# Patient Record
Sex: Female | Born: 2000 | Hispanic: No | Marital: Single | Smoking: Never smoker
Health system: Southern US, Community
[De-identification: ages and names within clinical notes are randomized; demographics above are authoritative.]

## PROBLEM LIST (undated history)

## (undated) DIAGNOSIS — E1165 Type 2 diabetes mellitus with hyperglycemia: Secondary | ICD-10-CM

## (undated) DIAGNOSIS — M419 Scoliosis, unspecified: Secondary | ICD-10-CM

## (undated) DIAGNOSIS — Q899 Congenital malformation, unspecified: Secondary | ICD-10-CM

## (undated) DIAGNOSIS — Q8711 Prader-Willi syndrome: Secondary | ICD-10-CM

## (undated) DIAGNOSIS — IMO0002 Reserved for concepts with insufficient information to code with codable children: Secondary | ICD-10-CM

## (undated) DIAGNOSIS — E1169 Type 2 diabetes mellitus with other specified complication: Secondary | ICD-10-CM

## (undated) DIAGNOSIS — M92512 Juvenile osteochondrosis of proximal tibia, left leg: Secondary | ICD-10-CM

## (undated) DIAGNOSIS — E08628 Diabetes mellitus due to underlying condition with other skin complications: Secondary | ICD-10-CM

## (undated) HISTORY — DX: Type 2 diabetes mellitus with hyperglycemia: E11.65

## (undated) HISTORY — DX: Scoliosis, unspecified: M41.9

## (undated) HISTORY — DX: Juvenile osteochondrosis of proximal tibia, left leg: M92.512

## (undated) HISTORY — DX: Type 2 diabetes mellitus with other specified complication: E11.69

## (undated) HISTORY — DX: Reserved for concepts with insufficient information to code with codable children: IMO0002

## (undated) HISTORY — DX: Prader-Willi syndrome: Q87.11

## (undated) HISTORY — PX: KNEE SURGERY: SHX244

## (undated) HISTORY — DX: Diabetes mellitus due to underlying condition with other skin complications: E08.628

## (undated) HISTORY — PX: MYRINGOTOMY: SHX2060

---
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
## 2020-07-20 ENCOUNTER — Ambulatory Visit (INDEPENDENT_AMBULATORY_CARE_PROVIDER_SITE_OTHER): Payer: Medicare Other | Admitting: Nurse Practitioner

## 2020-07-20 ENCOUNTER — Other Ambulatory Visit: Payer: Self-pay

## 2020-07-20 ENCOUNTER — Other Ambulatory Visit (HOSPITAL_BASED_OUTPATIENT_CLINIC_OR_DEPARTMENT_OTHER)
Admission: RE | Admit: 2020-07-20 | Discharge: 2020-07-20 | Disposition: A | Discharge status: Home / Self Care | Payer: Medicare Other | Source: Ambulatory Visit | Attending: Nurse Practitioner | Admitting: Nurse Practitioner

## 2020-07-20 ENCOUNTER — Other Ambulatory Visit (HOSPITAL_BASED_OUTPATIENT_CLINIC_OR_DEPARTMENT_OTHER): Payer: Self-pay

## 2020-07-20 ENCOUNTER — Encounter (HOSPITAL_BASED_OUTPATIENT_CLINIC_OR_DEPARTMENT_OTHER): Payer: Self-pay | Admitting: Nurse Practitioner

## 2020-07-20 VITALS — BP 155/95 | HR 92 | Ht 59.0 in | Wt 359.0 lb

## 2020-07-20 DIAGNOSIS — M25552 Pain in left hip: Secondary | ICD-10-CM

## 2020-07-20 DIAGNOSIS — M25551 Pain in right hip: Secondary | ICD-10-CM | POA: Insufficient documentation

## 2020-07-20 DIAGNOSIS — M25562 Pain in left knee: Secondary | ICD-10-CM

## 2020-07-20 DIAGNOSIS — Z7289 Other problems related to lifestyle: Secondary | ICD-10-CM

## 2020-07-20 DIAGNOSIS — Z7689 Persons encountering health services in other specified circumstances: Secondary | ICD-10-CM | POA: Diagnosis not present

## 2020-07-20 DIAGNOSIS — Z6841 Body Mass Index (BMI) 40.0 and over, adult: Secondary | ICD-10-CM

## 2020-07-20 DIAGNOSIS — E1165 Type 2 diabetes mellitus with hyperglycemia: Secondary | ICD-10-CM | POA: Insufficient documentation

## 2020-07-20 DIAGNOSIS — G8929 Other chronic pain: Secondary | ICD-10-CM | POA: Insufficient documentation

## 2020-07-20 DIAGNOSIS — Z0189 Encounter for other specified special examinations: Secondary | ICD-10-CM | POA: Diagnosis present

## 2020-07-20 DIAGNOSIS — T148XXA Other injury of unspecified body region, initial encounter: Secondary | ICD-10-CM

## 2020-07-20 DIAGNOSIS — Z794 Long term (current) use of insulin: Secondary | ICD-10-CM

## 2020-07-20 DIAGNOSIS — M25561 Pain in right knee: Secondary | ICD-10-CM

## 2020-07-20 DIAGNOSIS — M4100 Infantile idiopathic scoliosis, site unspecified: Secondary | ICD-10-CM

## 2020-07-20 DIAGNOSIS — M92512 Juvenile osteochondrosis of proximal tibia, left leg: Secondary | ICD-10-CM

## 2020-07-20 DIAGNOSIS — Q8711 Prader-Willi syndrome: Secondary | ICD-10-CM | POA: Diagnosis not present

## 2020-07-20 HISTORY — DX: Encounter for other specified special examinations: Z01.89

## 2020-07-20 HISTORY — DX: Other injury of unspecified body region, initial encounter: T14.8XXA

## 2020-07-20 HISTORY — DX: Body Mass Index (BMI) 40.0 and over, adult: Z684

## 2020-07-20 HISTORY — DX: Pain in left hip: M25.551

## 2020-07-20 HISTORY — DX: Pain in right hip: M25.552

## 2020-07-20 HISTORY — DX: Persons encountering health services in other specified circumstances: Z76.89

## 2020-07-20 LAB — CBC WITH DIFFERENTIAL/PLATELET
Abs Immature Granulocytes: 0.02 10*3/uL (ref 0.00–0.07)
Basophils Absolute: 0.1 10*3/uL (ref 0.0–0.1)
Basophils Relative: 1 %
Eosinophils Absolute: 0.1 10*3/uL (ref 0.0–0.5)
Eosinophils Relative: 2 %
HCT: 42.9 % (ref 36.0–46.0)
Hemoglobin: 13.5 g/dL (ref 12.0–15.0)
Immature Granulocytes: 0 %
Lymphocytes Relative: 43 %
Lymphs Abs: 3.6 10*3/uL (ref 0.7–4.0)
MCH: 27.3 pg (ref 26.0–34.0)
MCHC: 31.5 g/dL (ref 30.0–36.0)
MCV: 86.8 fL (ref 80.0–100.0)
Monocytes Absolute: 0.7 10*3/uL (ref 0.1–1.0)
Monocytes Relative: 8 %
Neutro Abs: 4 10*3/uL (ref 1.7–7.7)
Neutrophils Relative %: 46 %
Platelets: 359 10*3/uL (ref 150–400)
RBC: 4.94 MIL/uL (ref 3.87–5.11)
RDW: 14.1 % (ref 11.5–15.5)
WBC: 8.5 10*3/uL (ref 4.0–10.5)
nRBC: 0 % (ref 0.0–0.2)

## 2020-07-20 LAB — COMPREHENSIVE METABOLIC PANEL
ALT: 84 U/L — ABNORMAL HIGH (ref 0–44)
AST: 47 U/L — ABNORMAL HIGH (ref 15–41)
Albumin: 4.5 g/dL (ref 3.5–5.0)
Alkaline Phosphatase: 53 U/L (ref 38–126)
Anion gap: 11 (ref 5–15)
BUN: 15 mg/dL (ref 6–20)
CO2: 27 mmol/L (ref 22–32)
Calcium: 10 mg/dL (ref 8.9–10.3)
Chloride: 102 mmol/L (ref 98–111)
Creatinine, Ser: 0.61 mg/dL (ref 0.44–1.00)
GFR, Estimated: >60 mL/min (ref >60)
Glucose, Bld: 123 mg/dL — ABNORMAL HIGH (ref 70–99)
Potassium: 4.3 mmol/L (ref 3.5–5.1)
Sodium: 140 mmol/L (ref 135–145)
Total Bilirubin: 0.5 mg/dL (ref 0.3–1.2)
Total Protein: 7.4 g/dL (ref 6.5–8.1)

## 2020-07-20 LAB — LIPID PANEL
Cholesterol: 269 mg/dL — ABNORMAL HIGH (ref 0–200)
HDL: 44 mg/dL (ref >40)
LDL Cholesterol: 202 mg/dL — ABNORMAL HIGH (ref 0–99)
Total CHOL/HDL Ratio: 6.1 RATIO
Triglycerides: 116 mg/dL (ref <150)
VLDL: 23 mg/dL (ref 0–40)

## 2020-07-20 MED ORDER — METFORMIN HCL 500 MG PO TABS: 500.0000 mg | ORAL_TABLET | Freq: Two times a day (BID) | ORAL | 5 refills | Status: DC

## 2020-07-20 MED ORDER — MUPIROCIN 2 % EX OINT: TOPICAL_OINTMENT | CUTANEOUS | 5 refills | Status: DC

## 2020-07-20 NOTE — Assessment & Plan Note (Signed)
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 She is able to walk short distances independently but largely relies on walker for long range mobility. Unfortunately, this was unable to be corrected in childhood and will likely be a concern for the remainder of her life. Her limitations on physical activity affect her weight negatively and her chronic conditions. Hopeful that PT with water therapy will help improve mobility and decrease pain and allow for some weight loss.

## 2020-07-20 NOTE — Progress Notes (Signed)
New Patient Office Visit  Subjective:  Patient ID: [name], female    DOB: [birth year]  Age: 20 y.o. MRN: [id number]  CC:  Chief Complaint  Patient presents with  . Establish Care    HPI  [name] is a 20 year old female patient who presents today to establish care. She is with her father, [name], who helps provide history. He presents paperwork today for medical power of attorney for her care given her disability status.  She was previously seen for primary care services by Drue Second, NP with [facility]. She has aged out of pediatric care and is in need of adult care services.  She has also received care from several pediatric specialists to help manage her chronic conditions including genetic counseling and endocrinology.  Her last CPE was: about 2 years ago  Her past medical history includes:  Prader Willi Syndrome Diabetes type 2 Scoliosis Obesity  Concerns today include:  Joint Pain in lower extremities and back Weight gain Abdominal bloating  Skin picking  Joint Pain [name] and her dad report significant lower extremity joint pain related to her genetic condition. She endorses pain in her ankles, knees, hips, and lower back as a result of growth irregularities and scoliosis. Her dad reports noted deformity in the formation of her legs from the time she was about 20 years old. He states that multiple requests for surgical intervention were turned down during childhood and adolescence and now he is concerned that the malformation is causing some of her pain and issues. He endorses difficulty with ambulation that requires use of a walker when she is expected to travel more than short distances due to the pain. He would like for her to have knee surgery, however, reports that specialists in the past have told them that she will need to loose weight in order to have the surgery performed. This is quite difficult given her [name]  Syndrome and endless hunger and limited mobility for regular exercise. They are interested in physical therapy to help with her pain and mobility and specifically are interested in water therapy for her joint pain.   Weight gain  Her father endorses increased weight despite strict monitoring of her diet. He states that she will often sneak food that is accidentally left out and consume large quantities of food while they are sleeping. There are several members in the household, including 3 other children, which makes it difficult to keep all food sources locked up at all times. He reports that despite all of their efforts, they are unable to help her loose weight.   Abdominal bloating Her father endorses increased abdominal bloating and gas that he feels is related to metformin use. He reports that held the metformin doses for a few days to see if the bloating improved and it did. He endorses that her blood sugars went up about 30 points during the times that she was not on the metformin, but she was more comfortable and did not have nearly as much abdominal pain and bloating. He is interested to know if the dosage can be stopped or cut back to see if this would help her.   Skin Picking Her father reports that she obsessively picks at the skin around her finger nails and on her abdomen to the point of making scars. He reports that he keeps open areas covered and utilized mupirocin to help prevent infection from forming at the sites. He reports that  she does have one location on her abdomen at this time that she will pick at if it is left uncovered. He would like a refill on the mupirocin to use for these circumstances.   Narrative: [name] is a 20 year old female who lives at home with mom and dad and 3 siblings. She has two adult sisters who live out of the home and 2 brothers and 2 sisters in the home. She lives just a few doors down from her grandmother and one of her sisters and enjoys visiting with  them. She is able to walk to their homes independently once she has been helped down the stairs.  She walks with a walker when going long distances, but is independent for short periods. She is able to do most of her own ADL's. She enjoys singing and dancing and has a sense of humor and makes her family laugh.  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 She is able to communicate well and lets her family know her needs.   Past Medical History:  Diagnosis Date  . Birth defect    ROS Review of Systems All review of systems negative except what is listed in the HPI  Objective:   Today's Vitals: BP (!) 155/95   Pulse 92   Ht 4\' 11"  (1.499 m)   Wt (!) 359 lb (162.8 kg)   SpO2 99%   BMI 72.51 kg/m   Physical Exam Vitals and nursing note reviewed.  Constitutional:      Appearance: She is obese.  HENT:     Head: Normocephalic and atraumatic.  Eyes:     General: Lids are normal.     Extraocular Movements: Extraocular movements intact.     Conjunctiva/sclera: Conjunctivae normal.     Pupils: Pupils are equal, round, and reactive to light.  Neck:     Thyroid: No thyromegaly or thyroid tenderness.     Vascular: No carotid bruit.  Cardiovascular:     Rate and Rhythm: Normal rate and regular rhythm.     Pulses: Normal pulses.     Heart sounds: Normal heart sounds. No murmur heard.   Pulmonary:     Effort: Pulmonary effort is normal.     Breath sounds: Normal breath sounds.  Abdominal:     General: Abdomen is protuberant. Bowel sounds are increased. There is distension.     Palpations: Abdomen is soft.     Tenderness: There is no abdominal tenderness. There is no right CVA tenderness, left CVA tenderness or guarding.  Musculoskeletal:        General: Deformity present.     Cervical back: Normal range of  motion.     Comments: Skeletal deformities noted in the hands and lower extremities bilaterally with prominent scoliosis present to the spine. Visualization limited on this exam due to first meeting with patient.   Lymphadenopathy:     Cervical: No cervical adenopathy.  Skin:    General: Skin is warm and dry.     Capillary Refill: Capillary refill takes less than 2 seconds.     Findings: Lesion present.     Comments: Prominent scarring noted bilaterally on arms and on abdomen from reported scratching and picking. Wound on abdomen covered with dressing not visualized at this time.   Neurological:     Mental  Status: She is alert. Mental status is at baseline.     Cranial Nerves: Dysarthria present. No cranial nerve deficit.     Motor: Weakness and abnormal muscle tone present.     Coordination: Coordination abnormal.     Gait: Gait abnormal.  Psychiatric:        Attention and Perception: Attention normal.        Mood and Affect: Affect is flat.        Speech: Speech is delayed.        Behavior: Behavior is slowed and withdrawn. Behavior is cooperative.        Cognition and Memory: Memory normal. Cognition is impaired.     Assessment & Plan:   Problem List Items Addressed This Visit      Endocrine   Prader-Willi syndrome    Adult patient transitioning from pediatric care to adult care services. Complex history with multiple medical concerns related to genetic condition. Currently being cared for in home by parents with siblings present. Family very involved in care and intentional in helping her to be independent. She is quite independent in many areas, but will require lifelong assistance and supervision.  Concerns today include increased BMI with significant mobility limitations due to malformation and pain. Weight loss is a concern given her inability to control consumption of food and difficulties keeping food out of reach in household with other children. Additional concerns include  diabetic control and the affect that her elevated blood glucose may have on her overall health.  Pain related to joints likely due to increased body habitus in addition to defects in development. She would greatly benefit from therapy services to help with increased mobility and activity. Water therapy would be the best option given her weight to help increase her activity without additional stressors on the joints.  She will need monitoring for chronic illness and acute infections as well as ongoing therapy services.  Limited exam today to provide a chance to meet patient and allow her to be comfortable with me as a provider. She was verbally interactive and more comfortable at the end of the visit. Will plan for full physical exam in near future.  Discussed needs with patient and father today.       Relevant Orders   Ambulatory referral to Physical Therapy   Type 2 diabetes mellitus (HCC)    Type 2 diabetes in the setting of Prader Willi syndrome.  Her blood sugars at home range from 90-low 100's when she is fasting in the AM, however, she often manages to find food sources and when this happens her AM readings run in the 150's-160's.  Current dose of metformin causing GI distress with about a 30 point benefit to her fasting blood sugars. This is a significant number, but may be able to be managed with alternative means that would cause less distress.  Recommend decreasing metformin to  BID with meals and continuing once a day lantus at current 48 units.  Continue novolog dosing as directed by endocrinology for elevated post prandial blood glucose readings.  Father will keep log of blood glucose levels with decreased metformin dose and we will plan to follow-up in 4 weeks to see if GI symptoms are decreased and if there is a significant effect on her blood glucose levels.  Will perform research on effects of GLP-1 medications in the setting of Prader Johnnette Gourd to see if this would be of any  benefit for her.  Labs today to monitor A1c.  Relevant Medications   insulin glargine (LANTUS SOLOSTAR) 100 UNIT/ML Solostar Pen   metFORMIN (GLUCOPHAGE) 500 MG tablet   Other Relevant Orders   CBC w/Diff/Platelet (Completed)   Comprehensive metabolic panel (Completed)   Lipid panel (Completed)   HgB A1c   Thyroid Panel With TSH     Musculoskeletal and Integument   Scoliosis    Spinal defect limiting range of motion and activity due to ongoing pain. Increased body habitus likely having negative effect on her spine and exacerbating her pain. Unfortunately, weight loss is difficult due to her conditions and abilities to limit food consumption voluntarily.  Given her current pain and limitations, will refer for PT with recommendations for water therapy to help relieve pain and stress on the spine and joints.  I am hopeful with increased activity her pain will decrease and she will have some success with weight loss.       Tibia vara of left lower extremity    Deformity of left lower extremity causing limitations in mobility and activity. She is able to walk short distances independently but largely relies on walker for long range mobility. Unfortunately, this was unable to be corrected in childhood and will likely be a concern for the remainder of her life. Her limitations on physical activity affect her weight negatively and her chronic conditions. Hopeful that PT with water therapy will help improve mobility and decrease pain and allow for some weight loss.       Open wound of skin    Chronic picking of skin with multiple scars present on arms and abdomen visualized today. Open wound on abdomen covered with clean bandage and not undressed during the visit to avoid inciting increased anxiety and obsessive picking from the patient.  Her father is attentive to the wound and is applying mupirocin and clean dressings to keep the wound from infection. She will reportedly not pick when the  wound is covered, which is good.  Recommend continued use of mupirocin and dressings over wounds that have been opened to help prevent infection and increasing of wound size.       Relevant Medications   mupirocin ointment (BACTROBAN) 2 %     Other   Self-injurious behavior    Chronic scratching and picking of the skin with obsessive type behavior and creation of wounds. Multiple scars present on arms and abdomen bilaterally. Full visualization not made today to allow patient to become comfortable.  Recommend continued covering of open wounds and discourage picking and scratching when seen. Behavior likely will not stop, but may be limited with repeated recognition and praise for not performing activities.  Will follow.        Encounter to establish care - Primary    New patient to establish care with multiple complex needs and conditions.  Review of medical history performed today with father and patient.  Limited physical exam performed.  Will obtain previous records and evaluate for needs and previous services.  Parents and family very active and involved in her care.        Pain in joint involving pelvic region and thigh, bilateral    Bilateral lower extremity pain related to increased body habitus and malformation of joints and bone structures causing significant physical limitations.  Recommend PT with water therapy for increase mobility, decreased pain, and efforts for weight loss.  She will benefit from ongoing services to help her maintain any improvement and prevent further deconditioning and physical limitations.  Relevant Orders   Ambulatory referral to Physical Therapy   Encounter for routine laboratory testing   Relevant Orders   CBC w/Diff/Platelet (Completed)   Comprehensive metabolic panel (Completed)   Lipid panel (Completed)   HgB A1c   Thyroid Panel With TSH   Bilateral chronic knee pain    See pain in joint involving pelvic region and thigh.        Relevant Orders   Ambulatory referral to Physical Therapy   Body mass index (BMI) of 70 or greater in adult (HCC)    BMI today is 72.51. Her current body habitus is very limiting in her mobility and I have significant concerns for her overall health and lifespan. There are multiple factors that complicate her ability to lose weight including intellectual ability, prader willi syndrome, lack of mobility, and malformation of bones and joints.  Given her history of diabetes, will research medication options that may be beneficial for better blood glucose control and to help aid in weight loss. She may benefit from GLP-1 receptor agonist to aid in appetite suppression and weight loss if this is tolerated.  For now will begin treatments with PT and water therapy to help increase physical activity and mobility. Recommend strict monitoring of intake and avoidance of access to foods that are not in the planned diet. Consider referral to nutrition for additional support and recommendations.          Outpatient Encounter Medications as of 07/20/2020  Medication Sig  . Blood Glucose Monitoring Suppl (GLUCOCOM BLOOD GLUCOSE MONITOR) DEVI Use to check Bg up to 6 times daily as directed.  . Blood Glucose Monitoring Suppl (GLUCOCOM BLOOD GLUCOSE MONITOR) DEVI Use to check Bg up to 6 times daily as directed.  . insulin glargine (LANTUS SOLOSTAR) 100 UNIT/ML Solostar Pen INJECT 50 UNITS DAILY AS DIRECTED.  . Misc. Devices MISC Electric heavy duty 48 inch wide hospital bed.   Dx:  [name] Syndrome, Diabetes, obesity, increased risk for bed sores/skin break down  . [DISCONTINUED] ACCU-CHEK GUIDE test strip USE TO CHECK BLOOD GLUCOSE UP TO 6 TIME DAILY AS DIRECTED.  . [DISCONTINUED] ammonium lactate (LAC-HYDRIN) 12 % lotion Apply topically daily.  . [DISCONTINUED] Crisaborole (EUCRISA) 2 % OINT APPLY TO AFFECTED AREA TWICE A DAY  . [DISCONTINUED] diazepam (VALIUM) 1 MG/ML solution Take by mouth.  .  [DISCONTINUED] glucagon 1 MG injection INJECT 1 ML (1 MG TOTAL) INTO THE MUSCLE ONCE FOR 1 DOSE.  . [DISCONTINUED] hydrOXYzine (ATARAX) 10 MG/5ML syrup Take 5 mLs (10 mg total) by mouth 3 (three) times daily as needed for itching.  . [DISCONTINUED] insulin aspart (NOVOLOG) 100 UNIT/ML FlexPen Inject up to 40 units daily as directed.  . [DISCONTINUED] Insulin Pen Needle (FIFTY50 PEN NEEDLES) 31G X 5 MM MISC Use to inject insulin up to 6 times daily as directed.  . [DISCONTINUED] liraglutide (VICTOZA) 18 MG/3ML SOPN Begin with 0.6 mg daily for 1 week, then increase to 1.2 mg daily for 1 week, then increase and continue 1.8 mg daily -- doses directed by provider.  . [DISCONTINUED] metFORMIN (GLUCOPHAGE) 500 MG tablet Take by mouth.  . [DISCONTINUED] mupirocin ointment (BACTROBAN) 2 % APPLY TO AFFECTED AREA 2 TO 3 TIMES DAILY AS DIRECTED  . metFORMIN (GLUCOPHAGE) 500 MG tablet Take 1 tablet (500 mg total) by mouth 2 (two) times daily with a meal.  . mupirocin ointment (BACTROBAN) 2 % APPLY TO AFFECTED AREA 2 TO 3 TIMES DAILY AS DIRECTED   No facility-administered encounter  medications on file as of 07/20/2020.   Time: 78 minutes, >50% spent counseling, care coordination, chart review, and documentation.    Follow-up: Return for 3-4 weeks- follow up diabetes.   Tollie Eth, NP

## 2020-07-20 NOTE — Assessment & Plan Note (Signed)
See pain in joint involving pelvic region and thigh.

## 2020-07-20 NOTE — Assessment & Plan Note (Addendum)
Adult patient transitioning from pediatric care to adult care services. Complex history with multiple medical concerns related to genetic condition. Currently being cared for in home by parents with siblings present. Family very involved in care and intentional in helping her to be independent. She is quite independent in many areas, but will require lifelong assistance and supervision.  Concerns today include increased BMI with significant mobility limitations due to malformation and pain. Weight loss is a concern given her inability to control consumption of food and difficulties keeping food out of reach in household with other children. Additional concerns include diabetic control and the affect that her elevated blood glucose may have on her overall health.  Pain related to joints likely due to increased body habitus in addition to defects in development. She would greatly benefit from therapy services to help with increased mobility and activity. Water therapy would be the best option given her weight to help increase her activity without additional stressors on the joints.  She will need monitoring for chronic illness and acute infections as well as ongoing therapy services.  Limited exam today to provide a chance to meet patient and allow her to be comfortable with me as a provider. She was verbally interactive and more comfortable at the end of the visit. Will plan for full physical exam in near future.  Discussed needs with patient and father today.

## 2020-07-20 NOTE — Assessment & Plan Note (Signed)
Chronic picking of skin with multiple scars present on arms and abdomen visualized today. Open wound on abdomen covered with clean bandage and not undressed during the visit to avoid inciting increased anxiety and obsessive picking from the patient.  Her father is attentive to the wound and is applying mupirocin and clean dressings to keep the wound from infection. She will reportedly not pick when the wound is covered, which is good.  Recommend continued use of mupirocin and dressings over wounds that have been opened to help prevent infection and increasing of wound size.

## 2020-07-20 NOTE — Assessment & Plan Note (Addendum)
Type 2 diabetes in the setting of Prader Willi syndrome.  Her blood sugars at home range from 90-low 100's when she is fasting in the AM, however, she often manages to find food sources and when this happens her AM readings run in the 150's-160's.  Current dose of metformin causing GI distress with about a 30 point benefit to her fasting blood sugars. This is a significant number, but may be able to be managed with alternative means that would cause less distress.  Recommend decreasing metformin to 500mg  BID with meals and continuing once a day lantus at current 48 units.  Continue novolog dosing as directed by endocrinology for elevated post prandial blood glucose readings.  Father will keep log of blood glucose levels with decreased metformin dose and we will plan to follow-up in 4 weeks to see if GI symptoms are decreased and if there is a significant effect on her blood glucose levels.  Will perform research on effects of GLP-1 medications in the setting of Prader to see if this would be of any benefit for her.  Labs today to monitor A1c.

## 2020-07-20 NOTE — Assessment & Plan Note (Signed)
Bilateral lower extremity pain related to increased body habitus and malformation of joints and bone structures causing significant physical limitations.  Recommend PT with water therapy for increase mobility, decreased pain, and efforts for weight loss.  She will benefit from ongoing services to help her maintain any improvement and prevent further deconditioning and physical limitations.

## 2020-07-20 NOTE — Assessment & Plan Note (Signed)
BMI today is 72.51. Her current body habitus is very limiting in her mobility and I have significant concerns for her overall health and lifespan. There are multiple factors that complicate her ability to lose weight including intellectual ability, prader willi syndrome, lack of mobility, and malformation of bones and joints.  Given her history of diabetes, will research medication options that may be beneficial for better blood glucose control and to help aid in weight loss. She may benefit from GLP-1 receptor agonist to aid in appetite suppression and weight loss if this is tolerated.  For now will begin treatments with PT and water therapy to help increase physical activity and mobility. Recommend strict monitoring of intake and avoidance of access to foods that are not in the planned diet. Consider referral to nutrition for additional support and recommendations.

## 2020-07-20 NOTE — Patient Instructions (Addendum)
Recommendations from today's visit:  Lets plan to reduce the metformin to 524m in the morning and 5062min the evening to see if this helps with some of the bloating. We can continue to decrease this if she is still having symptoms. We will watch her blood sugars to make sure they are not jumping up a bunch. We can always look into other medication options if we start seeing blood sugars move the wrong direction.   I have sent the referral for physical therapy downstairs. They will be calling to schedule this with you all. I have ordered both water and regular PT to help with her mobility and joints.   I will let you know how the labs look and if we need to make any changes to anything based on the results.   Let's plan to come back in about 3-4 weeks and we can see how her blood sugars have looked on the lower doses of metformin and see how she is feeling. We can decide if we need to make changes at that time to her medications. We can also see how she is feeling with therapy.   I will review her medical records to see if we need to evaluate for anything else at the next visit. We can also discuss some weight loss strategies that may be helpful.    ____________________________________________________________ [facility] you [facility]. I am excited for the opportunity to partner with you to meet your health care goals. It was a pleasure meeting you today!  I am an Adult-Geriatric Nurse Practitioner with a background in caring for patients for more than 20 years. I received my BaPaediatric nursen Nursing and my Doctor of Nursing Practice degrees [facility] [employer] received additional fellowship training in primary care and sports medicine after receiving my doctorate degree. I provide primary care and sports medicine services to patients age 5461nd older within this office. I am also a provider with the [facility] the director of the APP Fellowship with [facility] [place] have called [place] for [place] 20 years and am proud to be a member of this community.   I am passionate about providing the best service to you through preventive medicine and supportive care. I consider you a part of the medical team and value your input. I work diligently to ensure that you are heard and your needs are met in a safe and effective manner. I want you to feel comfortable with me as your provider and want you to know that your health concerns are important to me.   For your information, our office hours are Monday- Friday 8:00 AM - 5:00 PM At this time I am not in the office on Wednesdays.  If you have questions or concerns, please call our office at [phone] send [place] MyChart message and we will respond as quickly as possible.   For all urgent or time sensitive needs we ask that you please call the office to avoid delays. MyChart is not constantly monitored and replies may take up to 72 business hours.  MyChart Policy: . MyChart allows for you to see your visit notes, after visit summary, provider recommendations, lab and tests results, make an appointment, request refills, and contact your provider or the office for non-urgent questions or concerns.  . Providers are  seeing patients during normal business hours and do not have built in time to review MyChart messages. We ask that you allow a minimum of 72 business hours for MyChart message responses.  . Complex MyChart concerns may require a visit. Your provider may request you schedule a virtual or in person visit to ensure we are providing the best care possible. . MyChart messages sent after 4:00 PM on Friday will not be received by the provider until Monday morning.    Lab and Test Results: . You will receive your lab and test results on MyChart as soon as they are completed and results  have been sent by the lab or testing facility. Due to this service, you will receive your results BEFORE your provider.  . Please allow a minimum of 72 business hours for your provider to receive and review lab and test results and contact you about.   . Most lab and test result comments from the provider will be sent through [place]. Your provider may recommend changes to the plan of care, follow-up visits, repeat testing, ask questions, or request an office visit to discuss these results. You may reply directly to this message or call the office at [phone] to provide information for the provider or set up an appointment. . In some instances, you will be called with test results and recommendations. Please let us know if this is preferred and we will make note of this in your chart to provide this for you.    . If you have not heard a response to your lab or test results in 72 business hours, please call the office to let us know.   After Hours: . For all non-emergency after hours needs, please call the office at [phone] and select the option to reach the on-call provider service. On-call services are shared between multiple [place] offices and therefore it will not be possible to speak directly with your provider. On-call providers may provide medical advice and recommendations, but are unable to provide refills for maintenance medications.  . For all emergency or urgent medical needs after normal business hours, we recommend that you seek care at the closest Urgent Care or Emergency Department to ensure appropriate treatment in a timely manner.  [facility] [place] has a 24 hour emergency room located on the ground floor for your convenience.    Please do not hesitate to reach out to [place] with concerns.   Thank you, again, for choosing me as your health care partner. I appreciate your trust and look forward to learning more about you.   Worthy Keeler, DNP,  AGNP-c ___________________________________________________________________________________

## 2020-07-20 NOTE — Assessment & Plan Note (Addendum)
Chronic scratching and picking of the skin with obsessive type behavior and creation of wounds. Multiple scars present on arms and abdomen bilaterally. Full visualization not made today to allow patient to become comfortable.  Recommend continued covering of open wounds and discourage picking and scratching when seen. Behavior likely will not stop, but may be limited with repeated recognition and praise for not performing activities.  Will follow.

## 2020-07-20 NOTE — Assessment & Plan Note (Signed)
Spinal defect limiting range of motion and activity due to ongoing pain. Increased body habitus likely having negative effect on her spine and exacerbating her pain. Unfortunately, weight loss is difficult due to her conditions and abilities to limit food consumption voluntarily.  Given her current pain and limitations, will refer for PT with recommendations for water therapy to help relieve pain and stress on the spine and joints.  I am hopeful with increased activity her pain will decrease and she will have some success with weight loss.

## 2020-07-20 NOTE — Assessment & Plan Note (Signed)
New patient to establish care with multiple complex needs and conditions.  Review of medical history performed today with father and patient.  Limited physical exam performed.  Will obtain previous records and evaluate for needs and previous services.  Parents and family very active and involved in her care.

## 2020-07-21 LAB — THYROID PANEL WITH TSH
Free Thyroxine Index: 2.5 (ref 1.2–4.9)
T3 Uptake Ratio: 27 % (ref 24–39)
T4, Total: 9.2 ug/dL (ref 4.5–12.0)
TSH: 2.46 u[IU]/mL (ref 0.450–4.500)

## 2020-07-21 LAB — HEMOGLOBIN A1C
Hgb A1c MFr Bld: 8.3 % — ABNORMAL HIGH (ref 4.8–5.6)
Mean Plasma Glucose: 191.51 mg/dL

## 2020-07-21 NOTE — Progress Notes (Signed)
A1c at 8.3 showing average blood glucose levels of about 192 over the past three  months. We do need to get better control of her diabetes to help prevent end organ damage. Will plan to discuss medication changes that may be beneficial in place of metformin, considering GI disturbance this causes. Consider GLP-1 to help with satiety and better blood sugar control.   Lipids are very elevated today. Her liver functions are elevated, suspect fatty liver given her weight gain. Will need to discuss medication options to help lower cholesterol and prevent liver damage.   Thyroid function is normal   Fasting blood glucose 123 today.  CBC normal.

## 2020-07-26 ENCOUNTER — Telehealth (HOSPITAL_BASED_OUTPATIENT_CLINIC_OR_DEPARTMENT_OTHER): Payer: Self-pay

## 2020-07-26 NOTE — Telephone Encounter (Signed)
-----   Message from Tollie Eth, NP sent at 07/21/2020  6:38 PM EDT ----- A1c at 8.3 showing average blood glucose levels of about 192 over the past three  months. We do need to get better control of her diabetes to help prevent end organ damage. Will plan to discuss medication changes that may be beneficial in place of metformin, considering GI disturbance this causes. Consider GLP-1 to help with satiety and better blood sugar control.   Lipids are very elevated today. Her liver functions are elevated, suspect fatty liver given her weight gain. Will need to discuss medication options to help lower cholesterol and prevent liver damage.   Thyroid function is normal   Fasting blood glucose 123 today.  CBC normal.

## 2020-07-26 NOTE — Telephone Encounter (Signed)
Called patient's father Zella Ball to go over results and recommendations.  He is aware and agreeable to to results and will keep the appointment scheduled for 5/24 @9 :50am.

## 2020-08-02 ENCOUNTER — Encounter (HOSPITAL_BASED_OUTPATIENT_CLINIC_OR_DEPARTMENT_OTHER): Payer: Self-pay | Admitting: Physical Therapy

## 2020-08-02 ENCOUNTER — Other Ambulatory Visit (HOSPITAL_COMMUNITY): Payer: Self-pay

## 2020-08-02 ENCOUNTER — Ambulatory Visit (INDEPENDENT_AMBULATORY_CARE_PROVIDER_SITE_OTHER): Payer: Medicare Other | Admitting: Nurse Practitioner

## 2020-08-02 ENCOUNTER — Ambulatory Visit (HOSPITAL_BASED_OUTPATIENT_CLINIC_OR_DEPARTMENT_OTHER): Payer: Medicare Other | Attending: Nurse Practitioner | Admitting: Physical Therapy

## 2020-08-02 ENCOUNTER — Encounter (HOSPITAL_BASED_OUTPATIENT_CLINIC_OR_DEPARTMENT_OTHER): Payer: Self-pay | Admitting: Nurse Practitioner

## 2020-08-02 ENCOUNTER — Other Ambulatory Visit: Payer: Self-pay

## 2020-08-02 ENCOUNTER — Other Ambulatory Visit (HOSPITAL_BASED_OUTPATIENT_CLINIC_OR_DEPARTMENT_OTHER): Payer: Self-pay

## 2020-08-02 VITALS — BP 139/89 | HR 101 | Ht 59.0 in | Wt 335.0 lb

## 2020-08-02 DIAGNOSIS — Z6841 Body Mass Index (BMI) 40.0 and over, adult: Secondary | ICD-10-CM | POA: Diagnosis not present

## 2020-08-02 DIAGNOSIS — M25562 Pain in left knee: Secondary | ICD-10-CM | POA: Insufficient documentation

## 2020-08-02 DIAGNOSIS — M6281 Muscle weakness (generalized): Secondary | ICD-10-CM | POA: Diagnosis present

## 2020-08-02 DIAGNOSIS — Q8711 Prader-Willi syndrome: Secondary | ICD-10-CM | POA: Diagnosis not present

## 2020-08-02 DIAGNOSIS — R030 Elevated blood-pressure reading, without diagnosis of hypertension: Secondary | ICD-10-CM | POA: Diagnosis not present

## 2020-08-02 DIAGNOSIS — G8929 Other chronic pain: Secondary | ICD-10-CM | POA: Insufficient documentation

## 2020-08-02 DIAGNOSIS — Z794 Long term (current) use of insulin: Secondary | ICD-10-CM

## 2020-08-02 DIAGNOSIS — R2689 Other abnormalities of gait and mobility: Secondary | ICD-10-CM | POA: Diagnosis present

## 2020-08-02 DIAGNOSIS — E1165 Type 2 diabetes mellitus with hyperglycemia: Secondary | ICD-10-CM | POA: Diagnosis not present

## 2020-08-02 DIAGNOSIS — E7800 Pure hypercholesterolemia, unspecified: Secondary | ICD-10-CM | POA: Insufficient documentation

## 2020-08-02 MED ORDER — TRULICITY 0.75 MG/0.5ML ~~LOC~~ SOAJ: 0.7500 mg | SUBCUTANEOUS | 4 refills | Status: DC

## 2020-08-02 NOTE — Assessment & Plan Note (Signed)
Supervision by family with locks on refrigerator and cabinets to help reduce access to food between meals.  Occasional issues with limited monitoring or food left out by siblings in household leading to episodes of over-eating- when this happens blood sugars are increased.  Family involved and supportive.  Recommend continued monitoring of food intake with healthy meal options and activities to avoid food being left out or unsupervised.  Working on improved diabetes and weight control with physical therapy and medication management.

## 2020-08-02 NOTE — Assessment & Plan Note (Addendum)
Last A1c 8.3%. Currently on Metformin 500mg  qAM and 1000mg  aPM and Lantus 46 units at bedtime.  Foot exam performed today. Microalbumin postponed to next visit.  Plan today- begin trulicity 0.75mg  once per week injection.  Stop night time lantus after trulicity injection for 2 days and monitor fasting blood glucose levels- report levels to me. Will determine lantus dosing based on fasting blood glucose- levels will likely be elevated, but want to avoid possible hypoglycemia.  Will likely need increased Trulicity dosing after 4 weeks. Continue to monitor blood sugar daily.  Continue with PT. Continue to restrict access to additional food sources outside of meal time.  Limited research at this time if GLP-1 will help with satiety, but mechanism of action may likely aid in improved glycemic control and hopefully weight loss efforts. Hopeful for improved lipids with better glucose control.  F/U 2 days after start of Trulicity with blood sugar readings.  F/U in 4 weeks with virtual visit for DM

## 2020-08-02 NOTE — Assessment & Plan Note (Signed)
Recent elevation in cholesterol in the setting of not well controlled diabetes.  Working to improve blood glucose levels and exercise with medication changes and addition of PT. Hopeful this will help to improve her cholesterol, as well.  Will plan to recheck in 6 months.

## 2020-08-02 NOTE — Assessment & Plan Note (Signed)
BMI down to 67.66 from 72 at last visit.  Discussed weight management and medication options. Start Trulicity 0.75mg  for diabetic control with hopeful improvement of weight.  Recommend restriction of food outside of meal time and healthy meal options.

## 2020-08-02 NOTE — Patient Instructions (Addendum)
We are going to start Trucility once a week at 0.75mg  dose.  She will get one shot every 7 days. When you start this, I want you to hold her lantus dose for the first two days.  Call me and let me know what her blood sugars are running the first and second day after getting the Trulicity shot and we will see where we need to adjust the Lantus while the Trulicity gets in her system.   We expect her blood sugars to be higher than usual these first few days while we work to adjust the medications to the right level, but I don't want her to drop too low.      Diabetes Mellitus and Standards of Medical Care Living with and managing diabetes (diabetes mellitus) can be complicated. Your diabetes treatment may be managed by a team of health care providers, including:  A physician who specializes in diabetes (endocrinologist). You might also have visits with a nurse practitioner or physician assistant.  Nurses.  A registered dietitian.  A certified diabetes care and education specialist.  An exercise specialist.  A pharmacist.  An eye doctor.  A foot specialist (podiatrist).  A dental care provider.  A primary care provider.  A mental health care provider. How to manage your diabetes You can do many things to successfully manage your diabetes. Your health care providers will follow guidelines to help you get the best quality of care. Here are general guidelines for your diabetes management plan. Your health care providers may give you more specific instructions. Physical exams When you are diagnosed with diabetes, and each year after that, your health care provider will ask about your medical and family history. You will have a physical exam, which may include:  Measuring your height, weight, and body mass index (BMI).  Checking your blood pressure. This will be done at every routine medical visit. Your target blood pressure may vary depending on your medical conditions, your age,  and other factors.  A thyroid exam.  A skin exam.  Screening for nerve damage (peripheral neuropathy). This may include checking the pulse in your legs and feet and the level of sensation in your hands and feet.  A foot exam to inspect the structure and skin of your feet, including checking for cuts, bruises, redness, blisters, sores, or other problems.  Screening for blood vessel (vascular) problems. This may include checking the pulse in your legs and feet and checking your temperature. Blood tests Depending on your treatment plan and your personal needs, you may have the following tests:  Hemoglobin A1C (HbA1C). This test provides information about blood sugar (glucose) control over the previous 2-3 months. It is used to adjust your treatment plan, if needed. This test will be done: ? At least 2 times a year, if you are meeting your treatment goals. ? 4 times a year, if you are not meeting your treatment goals or if your goals have changed.  Lipid testing, including total cholesterol, LDL and HDL cholesterol, and triglyceride levels. ? The goal for LDL is less than 100 mg/dL (5.5 mmol/L). If you are at high risk for complications, the goal is less than 70 mg/dL (3.9 mmol/L). ? The goal for HDL is 40 mg/dL (2.2 mmol/L) or higher for men, and 50 mg/dL (2.8 mmol/L) or higher for women. An HDL cholesterol of 60 mg/dL (3.3 mmol/L) or higher gives some protection against heart disease. ? The goal for triglycerides is less than 150 mg/dL (8.3 mmol/L).  Liver function tests.  Kidney function tests.  Thyroid function tests.   Dental and eye exams  Visit your dentist two times a year.  If you have type 1 diabetes, your health care provider may recommend an eye exam within 5 years after you are diagnosed, and then once a year after your first exam. ? For children with type 1 diabetes, the health care provider may recommend an eye exam when your child is age 38 or older and has had diabetes  for 3-5 years. After the first exam, your child should get an eye exam once a year.  If you have type 2 diabetes, your health care provider may recommend an eye exam as soon as you are diagnosed, and then every 1-2 years after your first exam.   Immunizations  A yearly flu (influenza) vaccine is recommended annually for everyone 6 months or older. This is especially important if you have diabetes.  The pneumonia (pneumococcal) vaccine is recommended for everyone 2 years or older who has diabetes. If you are age 47 or older, you may get the pneumonia vaccine as a series of two separate shots.  The hepatitis B vaccine is recommended for adults shortly after being diagnosed with diabetes. Adults and children with diabetes should receive all other vaccines according to age-specific recommendations from the Centers for Disease Control and Prevention (CDC). Mental and emotional health Screening for symptoms of eating disorders, anxiety, and depression is recommended at the time of diagnosis and after as needed. If your screening shows that you have symptoms, you may need more evaluation. You may work with a mental health care provider. Follow these instructions at home: Treatment plan You will monitor your blood glucose levels and may give yourself insulin. Your treatment plan will be reviewed at every medical visit. You and your health care provider will discuss:  How you are taking your medicines, including insulin.  Any side effects you have.  Your blood glucose level target goals.  How often you monitor your blood glucose level.  Lifestyle habits, such as activity level and tobacco, alcohol, and substance use. Education Your health care provider will assess how well you are monitoring your blood glucose levels and whether you are taking your insulin and medicines correctly. He or she may refer you to:  A certified diabetes care and education specialist to manage your diabetes throughout  your life, starting at diagnosis.  A registered dietitian who can create and review your personal nutrition plan.  An exercise specialist who can discuss your activity level and exercise plan. General instructions  Take over-the-counter and prescription medicines only as told by your health care provider.  Keep all follow-up visits. This is important. Where to find support There are many diabetes support networks, including:  American Diabetes Association (ADA): diabetes.org  Defeat Diabetes Foundation: defeatdiabetes.org Where to find more information  American Diabetes Association (ADA): www.diabetes.org  Association of Diabetes Care & Education Specialists (ADCES): diabeteseducator.org  International Diabetes Federation (IDF): http://hill.biz/ Summary  Managing diabetes (diabetes mellitus) can be complicated. Your diabetes treatment may be managed by a team of health care providers.  Your health care providers follow guidelines to help you get the best quality care.  You should have physical exams, blood tests, blood pressure monitoring, immunizations, and screening tests regularly. Stay updated on how to manage your diabetes.  Your health care providers may also give you more specific instructions based on your individual health. This information is not intended to replace advice given to  you by your health care provider. Make sure you discuss any questions you have with your health care provider. Document Revised: 09/18/2019 Document Reviewed: 09/18/2019 Elsevier Patient Education  2021 Elsevier Inc.   Diabetes Mellitus and Foot Care Foot care is an important part of your health, especially when you have diabetes. Diabetes may cause you to have problems because of poor blood flow (circulation) to your feet and legs, which can cause your skin to:  Become thinner and drier.  Break more easily.  Heal more slowly.  Peel and crack. You may also have nerve damage (neuropathy) in  your legs and feet, causing decreased feeling in them. This means that you may not notice minor injuries to your feet that could lead to more serious problems. Noticing and addressing any potential problems [name] is the best way to prevent future foot problems. How to care for your feet Foot hygiene  Wash your feet daily with warm water and mild soap. Do not use hot water. Then, pat your feet and the areas between your toes until they are completely dry. Do not soak your feet as this can dry your skin.  Trim your toenails straight across. Do not dig under them or around the cuticle. File the edges of your nails with an emery board or nail file.  Apply a moisturizing lotion or petroleum jelly to the skin on your feet and to dry, brittle toenails. Use lotion that does not contain alcohol and is unscented. Do not apply lotion between your toes.   Shoes and socks  Wear clean socks or stockings every day. Make sure they are not too tight. Do not wear knee-high stockings since they may decrease blood flow to your legs.  Wear shoes that fit properly and have enough cushioning. Always look in your shoes before you put them on to be sure there are no objects inside.  To break in new shoes, wear them for just a few hours a day. This prevents injuries on your feet. Wounds, scrapes, corns, and calluses  Check your feet daily for blisters, cuts, bruises, sores, and redness. If you cannot see the bottom of your feet, use a mirror or ask someone for help.  Do not cut corns or calluses or try to remove them with medicine.  If you find a minor scrape, cut, or break in the skin on your feet, keep it and the skin around it clean and dry. You may clean these areas with mild soap and water. Do not clean the area with peroxide, alcohol, or iodine.  If you have a wound, scrape, corn, or callus on your foot, look at it several times a day to make sure it is healing and not infected. Check for: ? Redness, swelling,  or pain. ? Fluid or blood. ? Warmth. ? Pus or a bad smell.   General tips  Do not cross your legs. This may decrease blood flow to your feet.  Do not use heating pads or hot water bottles on your feet. They may burn your skin. If you have lost feeling in your feet or legs, you may not know this is happening until it is too late.  Protect your feet from hot and cold by wearing shoes, such as at the beach or on hot pavement.  Schedule a complete foot exam at least once a year (annually) or more often if you have foot problems. Report any cuts, sores, or bruises to your health care provider immediately. Where to find  more information  American Diabetes Association: www.diabetes.org  Association of Diabetes Care & Education Specialists: www.diabeteseducator.org Contact a health care provider if:  You have a medical condition that increases your risk of infection and you have any cuts, sores, or bruises on your feet.  You have an injury that is not healing.  You have redness on your legs or feet.  You feel burning or tingling in your legs or feet.  You have pain or cramps in your legs and feet.  Your legs or feet are numb.  Your feet always feel cold.  You have pain around any toenails. Get help right away if:  You have a wound, scrape, corn, or callus on your foot and: ? You have pain, swelling, or redness that gets worse. ? You have fluid or blood coming from the wound, scrape, corn, or callus. ? Your wound, scrape, corn, or callus feels warm to the touch. ? You have pus or a bad smell coming from the wound, scrape, corn, or callus. ? You have a fever. ? You have a red line going up your leg. Summary  Check your feet every day for blisters, cuts, bruises, sores, and redness.  Apply a moisturizing lotion or petroleum jelly to the skin on your feet and to dry, brittle toenails.  Wear shoes that fit properly and have enough cushioning.  If you have foot problems, report  any cuts, sores, or bruises to your health care provider immediately.  Schedule a complete foot exam at least once a year (annually) or more often if you have foot problems. This information is not intended to replace advice given to you by your health care provider. Make sure you discuss any questions you have with your health care provider. Document Revised: 10/02/2019 Document Reviewed: 10/02/2019 Elsevier Patient Education  2021 [employer].

## 2020-08-02 NOTE — Progress Notes (Signed)
Established Patient Office Visit  Subjective:  Patient ID: [name], female    DOB: 10-Jun-2000  Age: 20 y.o. MRN: [id number]  CC:  Chief Complaint  Patient presents with  . Follow-up    HPI [name] presents for follow-up for DM, HLD, and Weight  DIABETES Medications: Metformin and Lantus 46 units at bedtime.  Last A1c: 8.3% on 07/20/2020 Last Urine Microalbumin: Unknown Last GFR: >60 on 07/20/2020 Last LDL: 202 on 07/20/2020 Taking medications as prescribed: yes Taking Insulin?: yes Glucose Monitoring: yes  Accucheck frequency: Daily  Fasting glucose: 149 today, 160 yesterday  Hypoglycemic episodes:no Polydipsia/polyuria: no Visual changes: no Chest pain: no Paresthesias: no  Blood Pressure Monitoring: no Retinal Examination: Not up to Date Foot Exam: Up to Date Diabetic Education: Completed Pneumovax: unknown Influenza: Up to Date Aspirin: no  HYPERLIPIDEMIA Medication: None Last LDL: 202 on 07/20/2020 Hyperlipidemia status: uncontrolled Supplements: none Aspirin:  no The ASCVD Risk score Denman George DC Jr., et al., 2013) failed to calculate for the following reasons:   The 2013 ASCVD risk score is only valid for ages 6 to 4 Chest pain:  no Coronary artery disease:  unknown Family history CAD:  unknown Family history Anayelli Lai CAD:  unknown  History of DM: Yes  WEIGHT MANAGEMENT Current Weight: 335 lb  BMI: 67.66 ASCVD Risk: Unable to calculate due to age Estimated weight gain in last 12 months:unknown Estimated weight loss in last 12 months:none History of:  DM: yes  HLD: yes  HTN: no  Hypothyroid: no  Hyperthyroid: no  CAD: no  Other medical conditions: Prader Willi Syndrome Previous attempts at weight loss: no Complications of obesity: ambulation difficulties, DM Weight loss goal and date: 30 lb in 6 months Current weight loss supplements/medications:non  Adverse effects: n/a  Time on treatment: n/a Previously attempted weight  loss supplements/medications:none Exercise/Activity performed: limited due to ambulation difficutlies  Time per week: n/a Typical Diet: strict dietary management unless she is able to get into food without supervision     Past Medical History:  Diagnosis Date  . Birth defect   . Body mass index (BMI) of 70 or greater in adult [facility]) 07/20/2020  . Encounter for routine laboratory testing 07/20/2020    Past Surgical History:  Procedure Laterality Date  . KNEE SURGERY      History reviewed. No pertinent family history.  Social History   Socioeconomic History  . Marital status: Single    Spouse name: Not on file  . Number of children: Not on file  . Years of education: Not on file  . Highest education level: Not on file  Occupational History  . Not on file  Tobacco Use  . Smoking status: Never Smoker  . Smokeless tobacco: Never Used  Vaping Use  . Vaping Use: Never used  Substance and Sexual Activity  . Alcohol use: No  . Drug use: Never  . Sexual activity: Never  Other Topics Concern  . Not on file  Social History Narrative  . Not on file   Social Determinants of Health   Financial Resource Strain: Not on file  Food Insecurity: Not on file  Transportation Needs: Not on file  Physical Activity: Inactive  . Days of Exercise per Week: 0 days  . Minutes of Exercise per Session: 0 min  Stress: Not on file  Social Connections: Not on file  Intimate Partner Violence: Not At Risk  . Fear of Current or Ex-Partner: No  . Emotionally Abused: No  .  Physically Abused: No  . Sexually Abused: No    Outpatient Medications Prior to Visit  Medication Sig Dispense Refill  . Blood Glucose Monitoring Suppl (GLUCOCOM BLOOD GLUCOSE MONITOR) DEVI Use to check Bg up to 6 times daily as directed.    . Blood Glucose Monitoring Suppl (GLUCOCOM BLOOD GLUCOSE MONITOR) DEVI Use to check Bg up to 6 times daily as directed.    . insulin glargine (LANTUS SOLOSTAR) 100 UNIT/ML Solostar Pen  INJECT 50 UNITS DAILY AS DIRECTED.    Marland Kitchen metFORMIN (GLUCOPHAGE) 500 MG tablet Take 1 tablet (500 mg total) by mouth 2 (two) times daily with a meal. 60 tablet 5  . Misc. Devices MISC Electric heavy duty 48 inch wide hospital bed.   Dx:  Jeanella Cara Syndrome, Diabetes, obesity, increased risk for bed sores/skin break down    . mupirocin ointment (BACTROBAN) 2 % APPLY TO AFFECTED AREA 2 TO 3 TIMES DAILY AS DIRECTED 30 g 5   No facility-administered medications prior to visit.    Allergies  Allergen Reactions  . Latex Rash    ROS Review of Systems All review of systems negative except what is listed in the HPI    Objective:    Physical Exam  BP 139/89   Pulse (!) 101   Ht 4\' 11"  (1.499 m)   Wt (!) 335 lb (152 kg)   SpO2 99%   BMI 67.66 kg/m  Wt Readings from Last 3 Encounters:  08/02/20 (!) 335 lb (152 kg)  07/20/20 (!) 359 lb (162.8 kg)  12/21/13 (!) 302 lb (137 kg) (>99 %, Z= 3.26)*   * Growth percentiles are based on CDC (Girls, 2-20 Years) data.     Health Maintenance Due  Topic Date Due  . PNEUMOCOCCAL POLYSACCHARIDE VACCINE AGE 86-64 HIGH RISK  Never done  . OPHTHALMOLOGY EXAM  Never done  . URINE MICROALBUMIN  Never done  . HIV Screening  Never done  . Hepatitis C Screening  Never done    There are no preventive care reminders to display for this patient.  Lab Results  Component Value Date   TSH 2.460 07/20/2020   Lab Results  Component Value Date   WBC 8.5 07/20/2020   HGB 13.5 07/20/2020   HCT 42.9 07/20/2020   MCV 86.8 07/20/2020   PLT 359 07/20/2020   Lab Results  Component Value Date   NA 140 07/20/2020   K 4.3 07/20/2020   CO2 27 07/20/2020   GLUCOSE 123 (H) 07/20/2020   BUN 15 07/20/2020   CREATININE 0.61 07/20/2020   BILITOT 0.5 07/20/2020   ALKPHOS 53 07/20/2020   AST 47 (H) 07/20/2020   ALT 84 (H) 07/20/2020   PROT 7.4 07/20/2020   ALBUMIN 4.5 07/20/2020   CALCIUM 10.0 07/20/2020   ANIONGAP 11 07/20/2020   Lab Results   Component Value Date   CHOL 269 (H) 07/20/2020   Lab Results  Component Value Date   HDL 44 07/20/2020   Lab Results  Component Value Date   LDLCALC 202 (H) 07/20/2020   Lab Results  Component Value Date   TRIG 116 07/20/2020   Lab Results  Component Value Date   CHOLHDL 6.1 07/20/2020   Lab Results  Component Value Date   HGBA1C 8.3 (H) 07/20/2020      Assessment & Plan:   Problem List Items Addressed This Visit      Endocrine   Prader-Willi syndrome    Supervision by family with locks on refrigerator and  cabinets to help reduce access to food between meals.  Occasional issues with limited monitoring or food left out by siblings in household leading to episodes of over-eating- when this happens blood sugars are increased.  Family involved and supportive.  Recommend continued monitoring of food intake with healthy meal options and activities to avoid food being left out or unsupervised.  Working on improved diabetes and weight control with physical therapy and medication management.       Type 2 diabetes mellitus (HCC) - Primary    Last A1c 8.3%. Currently on Metformin 500mg  qAM and 1000mg  aPM and Lantus 46 units at bedtime.  Foot exam performed today. Microalbumin postponed to next visit.  Plan today- begin trulicity 0.75mg  once per week injection.  Stop night time lantus after trulicity injection for 2 days and monitor fasting blood glucose levels- report levels to me. Will determine lantus dosing based on fasting blood glucose- levels will likely be elevated, but want to avoid possible hypoglycemia.  Will likely need increased Trulicity dosing after 4 weeks. Continue to monitor blood sugar daily.  Continue with PT. Continue to restrict access to additional food sources outside of meal time.  Limited research at this time if GLP-1 will help with satiety, but mechanism of action may likely aid in improved glycemic control and hopefully weight loss  efforts. Hopeful for improved lipids with better glucose control.  F/U 2 days after start of Trulicity with blood sugar readings.  F/U in 4 weeks with virtual visit for DM         Relevant Medications   Dulaglutide (TRULICITY) 0.75 MG/0.5ML SOPN     Other   Body mass index (BMI) of 60.0-69.9 in adult (HCC)    BMI down to 67.66 from 72 at last visit.  Discussed weight management and medication options. Start Trulicity 0.75mg  for diabetic control with hopeful improvement of weight.  Recommend restriction of food outside of meal time and healthy meal options.        Relevant Medications   Dulaglutide (TRULICITY) 0.75 MG/0.5ML SOPN   Elevated BP without diagnosis of hypertension    Initial BP elevated in the office today. Recheck much improved.  Will continue to monitor closely for changes.        Elevated cholesterol    Recent elevation in cholesterol in the setting of not well controlled diabetes.  Working to improve blood glucose levels and exercise with medication changes and addition of PT. Hopeful this will help to improve her cholesterol, as well.  Will plan to recheck in 6 months.          Meds ordered this encounter  Medications  . DISCONTD: Dulaglutide (TRULICITY) 0.75 MG/0.5ML SOPN    Sig: Inject 0.75 mg into the skin once a week.    Dispense:  2 mL    Refill:  4  . Dulaglutide (TRULICITY) 0.75 MG/0.5ML SOPN    Sig: Inject 0.75 mg into the skin once a week.    Dispense:  2 mL    Refill:  4    Follow-up: Return in about 4 weeks (around 08/30/2020) for Virtual Visit- Diabetes.   Time: 50 minutes, >50% spent counseling, care coordination, chart review, and documentation. Additional time required due to education, discussion and management of chronic disease.   10-01-1997, NP

## 2020-08-02 NOTE — Assessment & Plan Note (Signed)
Initial BP elevated in the office today. Recheck much improved.  Will continue to monitor closely for changes.

## 2020-08-03 ENCOUNTER — Encounter (HOSPITAL_BASED_OUTPATIENT_CLINIC_OR_DEPARTMENT_OTHER): Payer: Self-pay | Admitting: Physical Therapy

## 2020-08-03 NOTE — Patient Instructions (Signed)
Access Code: [email or web address] URL: [email or web address] Date: 08/03/2020 Prepared by: Lorayne Bender  Exercises Seated Long Arc Quad [phone] Degree Range) - 1 x daily - 7 x weekly - 3 sets - 10 reps Seated Hamstring Curls with Resistance - 1 x daily - 7 x weekly - 3 sets - 10 reps Seated Hip Abduction with Resistance - 1 x daily - 7 x weekly - 3 sets - 10 reps Seated Heel Raise - 1 x daily - 7 x weekly - 3 sets - 10 reps

## 2020-08-03 NOTE — Therapy (Signed)
[facility] [address] [place], [place], [id number] Phone: [phone]   Fax:  [phone]  Physical Therapy Evaluation  Patient Details  Name: Rebekah CosierMadison R Black MRN: [id number] Date of Birth: 02-22-2001 Referring Provider (PT): Enid SkeensSara Early NP   Encounter Date: 08/02/2020   PT End of Session - 08/03/20 1233    Visit Number 1    Number of Visits 16    Date for PT Re-Evaluation 09/28/20    PT Start Time 1430    PT Stop Time 1515    PT Time Calculation (min) 45 min    Activity Tolerance Patient tolerated treatment well    Behavior [facility] for tasks assessed/performed           Past Medical History:  Diagnosis Date  . Birth defect   . Body mass index (BMI) of 70 or greater in adult [facility](HCC) 07/20/2020  . Encounter for routine laboratory testing 07/20/2020    Past Surgical History:  Procedure Laterality Date  . KNEE SURGERY      There were no vitals filed for this visit.    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 She had a plate put in her knee > 8 years ago but it did not help her gait abnormality. [facility] is unclear if it was recently who reported she will need to loose 100 pounds if she is going to have surgery. Per patients father they have difficulty controlling her eating and she has difficulty exercising because of her joint pain. She had been working [facility] [employer] in the pool several years ago and had lost some weight. They hope to do aquatic therapy again.    Patient is accompained by: Family member   father is primary historian   Pertinent History Prater Willie syndrome,    Limitations Lifting;Standing    How long can you stand comfortably? patient reports pain as soon as she stands    How long can you walk comfortably? patient reports pain with any distance    Diagnostic tests no  recent x-rays    Patient Stated Goals patients father would like her to get on an exercise program    Currently in Pain? Yes   faces scale 4/10 with movement   Pain Score 4    faces pain scale   Pain Location Knee    Pain Orientation Left    Pain Descriptors / Indicators Aching    Pain Type Chronic pain    Pain Radiating Towards into the quad    Pain Onset More than a month ago    Pain Frequency Constant   with weight bearing   Aggravating Factors  standing and walking    Pain Relieving Factors rest    Multiple Pain Sites No              [facility] PT Assessment - 08/03/20 0001      Assessment   Medical Diagnosis Left Knee Istability/ Gait abnormality    Referring Provider (PT) Enid SkeensSara Early NP    Onset Date/Surgical Date --   Since birth   Hand Dominance Right    Next MD Visit nothing scheduled    Prior Therapy She has had PT severel years ago;      Precautions   Precautions None      Restrictions   Weight Bearing Restrictions No  Balance Screen   Has the patient fallen in the past 6 months No    Has the patient had a decrease in activity level because of a fear of falling?  No    Is the patient reluctant to leave their home because of a fear of falling?  No      Home Environment   Additional Comments Patient has steps going into the house; and steps up to the bedroom      Prior Function   Level of Independence Needs assistance with ADLs      Cognition   Overall Cognitive Status History of cognitive impairments - at baseline    Attention Focused    Focused Attention Impaired    Focused Attention Impairment Verbal basic;Functional basic    Memory Appears intact    Awareness Appears intact    Problem Solving Impaired    Problem Solving Impairment Verbal basic;Functional basic    Behaviors Impulsive      Observation/Other Assessments   Observations Patient picks at her hands. Picking behavior increases wihen she becomes more nervous.      Sensation   Light  Touch Appears Intact      Coordination   Gross Motor Movements are Fluid and Coordinated Yes      AROM   Overall AROM Comments pain at end range      PROM   Overall PROM Comments no pain with passive knnee flexion but limited by haitus; bilateral hips abductio with fleixon. Patient had difficulty lying supine      Strength   Overall Strength Comments able to follow mommands with mod cuing for MMT    Strength Assessment Site Hip;Knee    Right/Left Hip Right;Left    Right Hip Flexion 3/5    Right Hip ABduction 3/5    Left Hip Flexion 3/5    Left Hip ABduction 3/5    Right/Left Knee Right;Left    Right Knee Flexion 4+/5    Right Knee Extension 4+/5    Left Knee Flexion 3/5    Left Knee Extension 3/5      Palpation   Palpation comment no tenderness to palpation noted      Transfers   Comments uses hands to transfer from sit to stand      Ambulation/Gait   Gait Comments extreme left knee verus; her left hip collapses in weight bearing.                      Objective measurements completed on examination: See above findings.       [facility] Adult PT Treatment/Exercise - 08/03/20 0001      Knee/Hip Exercises: Seated   Long Arc Quad Limitations 90-45 2x10 bilateral mod cuinfg to stay on tasks    Other Seated Knee/Hip Exercises seated hip abdcution with mod cuing yellow; seated march 2x10 some difficulty 2nd to habitus                    PT Short Term Goals - 08/03/20 1346      PT SHORT TERM GOAL #1   Title Patient will be indepdnent with base exercise program    Time 3    Period Weeks    Status New    Target Date 08/24/20      PT SHORT TERM GOAL #2   Title Patient will increase gross LE strength by 5 lbs    Time 3    Period Weeks    Status  New    Target Date 08/24/20      PT SHORT TERM GOAL #3   Title Patient will report a 3 lb weiht loss    Time 3    Period Weeks    Status New    Target Date 08/24/20             PT Long Term  Goals - 08/03/20 1347      PT LONG TERM GOAL #1   Title Patient will go up/down 8 steps with minimal discomfort in order to get to her room    Time 6    Period Weeks    Status New    Target Date 09/14/20      PT LONG TERM GOAL #2   Title Patient will be independent with complete pool and land program to promote wiehgt loss and increase left LE strength and stability.    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 She hopes to  have a corrective surgery on the left leg but has been told she needs to llose 100 lbs. She has had success exercising in the water in the past but has stopped water exercises over the past few years. She has increased left knee pain in standing and signifcant verus at the knee with ambualtion. Her left hip also colapses with ambualtion. She would benefit from skilled therapy to improve patients ability to ambualte, improve pain, and help apatient towards her weight loss skills.    Personal Factors and Comorbidities Behavior Pattern;Comorbidity 1;Comorbidity 2;Past/Current Experience;Fitness;Time since onset of injury/illness/exacerbation    Comorbidities DMII, BMI < 70; Congential defomity of the left leg; Parder Willi syndrome,    Examination-Activity Limitations Bend;Lift;Locomotion Level;Squat;Stand;Stairs;Sit;Carry;Transfers    Examination-Participation Restrictions Cleaning;Laundry;Shop;Community Activity    Stability/Clinical Decision Making Evolving/Moderate complexity    Clinical Decision Making High    Rehab Potential Fair   longstanding gait deformity; difficulty controlling eating at home   PT  Frequency 2x / week    PT Duration 8 weeks    PT Treatment/Interventions ADLs/Self Care Home Management;Aquatic Therapy;Moist Heat;Ultrasound;Stair training;Functional mobility training;Gait training;Therapeutic activities;Therapeutic exercise;Patient/family education;Manual techniques;Passive range of motion;Taping    PT Next Visit Plan be aware of patients skin. She picks at her skin. If she has opened wonds then we may not be able to see her in the pool. Patient at this time has small scabbed areas but previous notes note mroe severe areas at times. Begin with any weight bearing activity he patient can tolerate.    PT Home Exercise Plan Access Code: [email or web address]  URL: [email or web address]  Date: 08/03/2020  Prepared by: Lorayne Bender    Exercises  Seated Long Arc Quad [phone] Degree Range) - 1 x daily - 7 x weekly - 3 sets - 10 reps  Seated Hamstring Curls with Resistance - 1 x daily - 7 x weekly - 3 sets - 10 reps  Seated Hip Abduction with Resistance - 1 x daily - 7 x weekly - 3 sets - 10 reps  Seated Heel Raise - 1 x daily - 7 x weekly - 3 sets - 10 reps    Consulted and Agree with Plan of Care Patient           Patient will benefit from skilled therapeutic intervention in order to improve the following deficits and impairments:  Abnormal gait,Decreased endurance,Increased muscle spasms,Obesity,Pain,Decreased activity tolerance,Decreased safety awareness,Decreased strength,Difficulty walking  Visit Diagnosis: Chronic pain of left knee  Other abnormalities of gait and mobility  Muscle weakness (generalized)     Problem List Patient Active Problem List   Diagnosis Date Noted  . Body mass index (BMI) of 60.0-69.9 in adult (HCC) 08/02/2020  . Elevated BP without diagnosis of hypertension 08/02/2020  . Elevated cholesterol 08/02/2020  . Encounter to establish care 07/20/2020  . Pain in joint involving pelvic region and thigh, bilateral 07/20/2020  . Bilateral chronic knee  pain 07/20/2020  . Open wound of skin 07/20/2020  . Self-injurious behavior 11/06/2016  . Type 2 diabetes mellitus (HCC) 11/06/2016  . 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 Scoliosis 05/05/2011  . Prader-Willi syndrome 05/30/2007    Dessie Coma PT DPT  08/03/2020, 5:11 PM  [facility] [address] [place], [place], [address] Phone: [phone]   Fax:  [phone]  Name: CORRISA GIBBY MRN: [id number] Date of Birth: 08-31-2000

## 2020-08-03 NOTE — Therapy (Deleted)
[facility] [address] [place], [place], [address] Phone: [phone]   Fax:  [phone]  Physical Therapy Treatment  Patient Details  Name: Rebekah CosierMadison R Black MRN: [id number] Date of Birth: December 02, 2000 Referring Provider (PT): Enid SkeensSara Early NP   Encounter Date: 08/02/2020   PT End of Session - 08/03/20 1233    Visit Number 1    Number of Visits 16    Date for PT Re-Evaluation 09/28/20    PT Start Time 1430    PT Stop Time 1515    PT Time Calculation (min) 45 min    Activity Tolerance Patient tolerated treatment well    Behavior [facility] Therapy [facility] for tasks assessed/performed           Past Medical History:  Diagnosis Date  . Birth defect   . Body mass index (BMI) of 70 or greater in adult [facility](HCC) 07/20/2020  . Encounter for routine laboratory testing 07/20/2020    Past Surgical History:  Procedure Laterality Date  . KNEE SURGERY      There were no vitals filed for this visit.   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 She had a plate put in her knee > 8 years ago but it did not help her gait abnormality. [facility] it is unclear if it was recently who reported she will need to loose 100 pounds if she is going to have surgery. Per patients father they have difficulty controlling her eating and she has difficulty exercising because of her joint pain. She had been working [facility] [employer] in the pool several years ago and had lost some weight. They hope to do aquatic therapy again.    Patient is accompained by: Family member   father is primary historian   Pertinent History Prater Willie syndrome,    Limitations Lifting;Standing    How long can you stand comfortably? patient reports pain as soon as she stands    How long can you walk comfortably? patient reports pain with any distance    Diagnostic tests no recent  x-rays    Patient Stated Goals patients father would like her to get on an exercise program    Currently in Pain? Yes   faces scale 4/10 with movement   Pain Score 4    faces pain scale   Pain Location Knee    Pain Orientation Left    Pain Descriptors / Indicators Aching    Pain Type Chronic pain    Pain Radiating Towards into the quad    Pain Onset More than a month ago    Pain Frequency Constant   with weight bearing   Aggravating Factors  standing and walking    Pain Relieving Factors rest    Multiple Pain Sites No              [facility] PT Assessment - 08/03/20 0001      Assessment   Medical Diagnosis Left Knee Istability/ Gait abnormality    Referring Provider (PT) Enid SkeensSara Early NP    Onset Date/Surgical Date --   Since birth   Hand Dominance Right    Next MD Visit nothing scheduled    Prior Therapy She has had PT severel years ago;      Precautions   Precautions None      Restrictions   Weight Bearing Restrictions No  Balance Screen   Has the patient fallen in the past 6 months No    Has the patient had a decrease in activity level because of a fear of falling?  No    Is the patient reluctant to leave their home because of a fear of falling?  No      Home Environment   Additional Comments Patient has steps going into the house; and steps up to the bedroom      Prior Function   Level of Independence Needs assistance with ADLs      Cognition   Overall Cognitive Status History of cognitive impairments - at baseline    Attention Focused    Focused Attention Impaired    Focused Attention Impairment Verbal basic;Functional basic    Memory Appears intact    Awareness Appears intact    Problem Solving Impaired    Problem Solving Impairment Verbal basic;Functional basic    Behaviors Impulsive      Observation/Other Assessments   Observations Patient picks at her hands. Picking behavior increases wihen she becomes more nervous.      Sensation   Light Touch  Appears Intact      Coordination   Gross Motor Movements are Fluid and Coordinated Yes      AROM   Overall AROM Comments pain at end range      PROM   Overall PROM Comments no pain with passive knnee flexion but limited by haitus; bilateral hips abductio with fleixon. Patient had difficulty lying supine      Strength   Overall Strength Comments able to follow mommands with mod cuing for MMT    Strength Assessment Site Hip;Knee    Right/Left Hip Right;Left    Right Hip Flexion 3/5    Right Hip ABduction 3/5    Left Hip Flexion 3/5    Left Hip ABduction 3/5    Right/Left Knee Right;Left    Right Knee Flexion 4+/5    Right Knee Extension 4+/5    Left Knee Flexion 3/5    Left Knee Extension 3/5      Palpation   Palpation comment no tenderness to palpation noted      Transfers   Comments uses hands to transfer from sit to stand      Ambulation/Gait   Gait Comments extreme left knee verus; her left hip collapses in weight bearing.                         [facility] Adult PT Treatment/Exercise - 08/03/20 0001      Knee/Hip Exercises: Seated   Long Arc Quad Limitations 90-45 2x10 bilateral mod cuinfg to stay on tasks    Other Seated Knee/Hip Exercises seated hip abdcution with mod cuing yellow; seated march 2x10 some difficulty 2nd to habitus                    PT Short Term Goals - 08/03/20 1346      PT SHORT TERM GOAL #1   Title Patient will be indepdnent with base exercise program    Time 3    Period Weeks    Status New    Target Date 08/24/20      PT SHORT TERM GOAL #2   Title Patient will increase gross LE strength by 5 lbs    Time 3    Period Weeks    Status New    Target Date 08/24/20  PT SHORT TERM GOAL #3   Title Patient will report a 3 lb weiht loss    Time 3    Period Weeks    Status New    Target Date 08/24/20             PT Long Term Goals - 08/03/20 1347      PT LONG TERM GOAL #1   Title Patient will go up/down 8  steps with minimal discomfort in order to get to her room    Time 6    Period Weeks    Status New    Target Date 09/14/20      PT LONG TERM GOAL #2   Title Patient will be independent with complete pool and land program to promote wiehgt loss and increase left LE strength and stability.    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 She hopes to  have a corrective surgery on the left leg but has been told she needs to llose 100 lbs. She has had success exercising in the water in the past but has stopped water exercises over the past few years. She has increased left knee pain in standing and signifcant verus at the knee with ambualtion. Her left hip also colapses with ambualtion. She would benefit from skilled therapy to improve patients ability to ambualte, improve pain, and help apatient towards her weight loss skills.    Personal Factors and Comorbidities Behavior Pattern;Comorbidity 1;Comorbidity 2;Past/Current Experience;Fitness;Time since onset of injury/illness/exacerbation    Comorbidities DMII, BMI < 70; Congential defomity of the left leg; Parder Willi syndrome,    Examination-Activity Limitations Bend;Lift;Locomotion Level;Squat;Stand;Stairs;Sit;Carry;Transfers    Examination-Participation Restrictions Cleaning;Laundry;Shop;Community Activity    Stability/Clinical Decision Making Evolving/Moderate complexity    Clinical Decision Making High    Rehab Potential Fair   longstanding gait deformity; difficulty controlling eating at home   PT Frequency 2x / week    PT Duration 8 weeks    PT Treatment/Interventions ADLs/Self  Care Home Management;Aquatic Therapy;Moist Heat;Ultrasound;Stair training;Functional mobility training;Gait training;Therapeutic activities;Therapeutic exercise;Patient/family education;Manual techniques;Passive range of motion;Taping    PT Next Visit Plan be aware of patients skin. She picks at her skin. If she has opened wonds then we may not be able to see her in the pool. Patient at this time has small scabbed areas but previous notes note mroe severe areas at times. Begin with any weight bearing activity he patient can tolerate.    PT Home Exercise Plan Access Code: [email or web address]  URL: [email or web address]  Date: 08/03/2020  Prepared by: Lorayne Bender    Exercises  Seated Long Arc Quad [phone] Degree Range) - 1 x daily - 7 x weekly - 3 sets - 10 reps  Seated Hamstring Curls with Resistance - 1 x daily - 7 x weekly - 3 sets - 10 reps  Seated Hip Abduction with Resistance - 1 x daily - 7 x weekly - 3 sets - 10 reps  Seated Heel Raise - 1 x daily - 7 x weekly - 3  sets - 10 reps    Consulted and Agree with Plan of Care Patient           Patient will benefit from skilled therapeutic intervention in order to improve the following deficits and impairments:  Abnormal gait,Decreased endurance,Increased muscle spasms,Obesity,Pain,Decreased activity tolerance,Decreased safety awareness,Decreased strength,Difficulty walking  Visit Diagnosis: Chronic pain of left knee  Other abnormalities of gait and mobility  Muscle weakness (generalized)     Problem List Patient Active Problem List   Diagnosis Date Noted  . Body mass index (BMI) of 60.0-69.9 in adult (HCC) 08/02/2020  . Elevated BP without diagnosis of hypertension 08/02/2020  . Elevated cholesterol 08/02/2020  . Encounter to establish care 07/20/2020  . Pain in joint involving pelvic region and thigh, bilateral 07/20/2020  . Bilateral chronic knee pain 07/20/2020  . Open wound of skin 07/20/2020  . Self-injurious behavior  11/06/2016  . Type 2 diabetes mellitus (HCC) 11/06/2016  . 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 Scoliosis 05/05/2011  . Prader-Willi syndrome 05/30/2007    Dessie Coma PT DPT 08/03/2020, 5:11 PM  [facility] [address] [place], [place], [address] Phone: [phone]   Fax:  [phone]  Name: DALAYZA ZAMBRANA MRN: [id number] Date of Birth: [birth year]

## 2020-08-05 ENCOUNTER — Other Ambulatory Visit: Payer: Self-pay

## 2020-08-05 ENCOUNTER — Ambulatory Visit (HOSPITAL_BASED_OUTPATIENT_CLINIC_OR_DEPARTMENT_OTHER): Payer: Medicare Other | Admitting: Physical Therapy

## 2020-08-05 ENCOUNTER — Encounter (HOSPITAL_BASED_OUTPATIENT_CLINIC_OR_DEPARTMENT_OTHER): Payer: Self-pay | Admitting: Physical Therapy

## 2020-08-05 DIAGNOSIS — G8929 Other chronic pain: Secondary | ICD-10-CM

## 2020-08-05 DIAGNOSIS — M25562 Pain in left knee: Secondary | ICD-10-CM

## 2020-08-05 DIAGNOSIS — R2689 Other abnormalities of gait and mobility: Secondary | ICD-10-CM

## 2020-08-05 DIAGNOSIS — M6281 Muscle weakness (generalized): Secondary | ICD-10-CM

## 2020-08-05 NOTE — Therapy (Signed)
[name] [address] [place], [place], [address] Phone: [phone]   Fax:  [phone]  Physical Therapy Treatment  Patient Details  Name: [name] MRN: [id number] Date of Birth: 12/31/2000 Referring Provider (PT): Rebekah Skeens NP   Encounter Date: 08/05/2020   PT End of Session - 08/05/20 1550    Visit Number 2    Number of Visits 16    Date for PT Re-Evaluation 09/28/20    Authorization Type medicare progress note at 10th visit    PT Start Time 1332    PT Stop Time 1420    PT Time Calculation (min) 48 min    Activity Tolerance Other (comment)   Pt required encourament and reassurance to enter water.  throughout treatment she needed to be next to the wall holding or with unilateral/bilateral hand holding support   Behavior During Therapy --   apprehensive          Past Medical History:  Diagnosis Date  . Birth defect   . Body mass index (BMI) of 70 or greater in adult [facility]) 07/20/2020  . Encounter for routine laboratory testing 07/20/2020    Past Surgical History:  Procedure Laterality Date  . KNEE SURGERY      There were no vitals filed for this visit.   Subjective Assessment - 08/05/20 1549    Subjective Pt apprehensive although excited about getting into the pool.  She voices concerns that it may be too deep.  Father present and is encouraging pt to participate.    Patient is accompained by: Family member    Pertinent History [name] syndrome,    Limitations Lifting;Standing            Treatment Pt directed through stepping in and out of water on bottom 2 steps, side stepping holding to grab bars and the side of pool. Was able to gain confidence of pt to water walk forward and back decreasing support of wall and grab bar to unilateral then bilateral hand holding.  Pt participated in jumping and marching and squatting in standing position. Seated hip and knee flex and extension, scissor  kicking, adduction/abd.  Core strengthening encouraging pt stand unsupported while PT created perturbations.  Pt tolerated last for approx 30 sec due to apprehensiveness.  Pt, once comfortable in aquatic setting was able to maintain continuous movement for aerobic capacity challenge until end of session.  No reports of pain.                           PT Short Term Goals - 08/03/20 1346      PT SHORT TERM GOAL #1   Title Patient will be indepdnent with base exercise program    Time 3    Period Weeks    Status New    Target Date 08/24/20      PT SHORT TERM GOAL #2   Title Patient will increase gross LE strength by 5 lbs    Time 3    Period Weeks    Status New    Target Date 08/24/20      PT SHORT TERM GOAL #3   Title Patient will report a 3 lb weiht loss    Time 3    Period Weeks    Status New    Target Date 08/24/20             PT Long Term Goals - 08/03/20 1347  PT LONG TERM GOAL #1   Title Patient will go up/down 8 steps with minimal discomfort in order to get to her room    Time 6    Period Weeks    Status New    Target Date 09/14/20      PT LONG TERM GOAL #2   Title Patient will be independent with complete pool and land program to promote wiehgt loss and increase left LE strength and stability.    Time 6    Period Weeks    Status New    Target Date 09/14/20                 Plan - 08/05/20 1554    Clinical Impression Statement continue to reassure and improve confidence of pt trusting therapist to engage in activity.    Personal Factors and Comorbidities Behavior Pattern;Comorbidity 1;Comorbidity 2;Past/Current Experience;Fitness;Time since onset of injury/illness/exacerbation    Comorbidities DMII, BMI < 70; Congential defomity of the left leg; Parder Willi syndrome,    Examination-Activity Limitations Bend;Lift;Locomotion Level;Squat;Stand;Stairs;Sit;Carry;Transfers    Stability/Clinical Decision Making Evolving/Moderate  complexity    Clinical Decision Making High    Rehab Potential Fair    PT Frequency 2x / week    PT Duration 8 weeks    PT Treatment/Interventions ADLs/Self Care Home Management;Aquatic Therapy;Moist Heat;Ultrasound;Stair training;Functional mobility training;Gait training;Therapeutic activities;Therapeutic exercise;Patient/family education;Manual techniques;Passive range of motion;Taping    PT Next Visit Plan be aware of patients skin. She picks at her skin. If she has opened wonds then we may not be able to see her in the pool. Patient at this time has small scabbed areas but previous notes note mroe severe areas at times. Begin with any weight bearing activity he patient can tolerate.    PT Home Exercise Plan Access Code: [email or web address]  URL: [email or web address]  Date: 08/03/2020  Prepared by: Lorayne Bender    Exercises  Seated Long Arc Quad [phone] Degree Range) - 1 x daily - 7 x weekly - 3 sets - 10 reps  Seated Hamstring Curls with Resistance - 1 x daily - 7 x weekly - 3 sets - 10 reps  Seated Hip Abduction with Resistance - 1 x daily - 7 x weekly - 3 sets - 10 reps  Seated Heel Raise - 1 x daily - 7 x weekly - 3 sets - 10 reps           Patient will benefit from skilled therapeutic intervention in order to improve the following deficits and impairments:  Abnormal gait,Decreased endurance,Increased muscle spasms,Obesity,Pain,Decreased activity tolerance,Decreased safety awareness,Decreased strength,Difficulty walking  Visit Diagnosis: Chronic pain of left knee  Other abnormalities of gait and mobility  Muscle weakness (generalized)     Problem List Patient Active Problem List   Diagnosis Date Noted  . Body mass index (BMI) of 60.0-69.9 in adult (HCC) 08/02/2020  . Elevated BP without diagnosis of hypertension 08/02/2020  . Elevated cholesterol 08/02/2020  . Encounter to establish care 07/20/2020  . Pain in joint involving pelvic region and thigh, bilateral 07/20/2020   . Bilateral chronic knee pain 07/20/2020  . Open wound of skin 07/20/2020  . Self-injurious behavior 11/06/2016  . Type 2 diabetes mellitus (HCC) 11/06/2016  . 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 Scoliosis 05/05/2011  . Prader-Willi syndrome 05/30/2007    Jeanmarie Hubert MPT 08/05/2020, 3:57 PM  [facility] [address] [place], [place], [address] Phone: [phone]   Fax:  [phone]  Name: [name] MRN: [id number] Date of Birth: 2001/01/02

## 2020-08-10 ENCOUNTER — Ambulatory Visit (HOSPITAL_BASED_OUTPATIENT_CLINIC_OR_DEPARTMENT_OTHER): Payer: Medicare Other | Admitting: Physical Therapy

## 2020-08-10 ENCOUNTER — Other Ambulatory Visit: Payer: Self-pay

## 2020-08-10 ENCOUNTER — Encounter (HOSPITAL_BASED_OUTPATIENT_CLINIC_OR_DEPARTMENT_OTHER): Payer: Self-pay | Admitting: Physical Therapy

## 2020-08-10 DIAGNOSIS — R2689 Other abnormalities of gait and mobility: Secondary | ICD-10-CM

## 2020-08-10 DIAGNOSIS — M25562 Pain in left knee: Secondary | ICD-10-CM | POA: Diagnosis not present

## 2020-08-10 DIAGNOSIS — M6281 Muscle weakness (generalized): Secondary | ICD-10-CM

## 2020-08-10 DIAGNOSIS — G8929 Other chronic pain: Secondary | ICD-10-CM

## 2020-08-10 NOTE — Therapy (Signed)
[facility] [address] [place], [place], [address] Phone: [phone]   Fax:  [phone]  Physical Therapy Treatment  Patient Details  Name: [name] MRN: [id number] Date of Birth: 07-08-2000 Referring Provider (PT): Enid Skeens NP   Encounter Date: 08/10/2020   PT End of Session - 08/10/20 1646    Visit Number 3    Number of Visits 16    Date for PT Re-Evaluation 09/28/20    Authorization Type medicare progress note at 10th visit    PT Start Time 1400    PT Stop Time 1445    PT Time Calculation (min) 45 min    Activity Tolerance Patient tolerated treatment well;Other (comment)   difficulty keeping pt on task today, easily distracted   Behavior During Therapy [facility] for tasks assessed/performed   distracted          Past Medical History:  Diagnosis Date  . Birth defect   . Body mass index (BMI) of 70 or greater in adult [facility]) 07/20/2020  . Encounter for routine laboratory testing 07/20/2020    Past Surgical History:  Procedure Laterality Date  . KNEE SURGERY      There were no vitals filed for this visit.   Subjective Assessment - 08/10/20 1644    Subjective Father states pt enjoyed last session and did not c/o any discomfort.    Patient is accompained by: Family member    Pertinent History Prater Willie syndrome,    Limitations Lifting;Standing           TREATMENT Pt seen for aquatic therapy today.  Treatment took place in water 3.25-4.8 ft in depth at the [facility] pool. Temp of water was 90.  Pt entered/exited the pool via stairs step to pattern cga with bilat rail. Warm up: forward, backward and side stepping/walking across pool and along pools edge holding to wall after pt coerced gently into pool. Encouraged exercises sitting on bench as well as standing df, pf, marching, flutter and scissor kicking.  Pt given constant VC for time on task Static balance challeges unsupported for  20-30 secs x 3 pushing noodle down horizontally.  Pt requires buoyancy for support and to offload joints with strengthening exercises. Viscosity of the water is needed for resistance of strengthening; water current perturbations provides challenge to standing balance unsupported, requiring increased core activation.                            PT Short Term Goals - 08/03/20 1346      PT SHORT TERM GOAL #1   Title Patient will be indepdnent with base exercise program    Time 3    Period Weeks    Status New    Target Date 08/24/20      PT SHORT TERM GOAL #2   Title Patient will increase gross LE strength by 5 lbs    Time 3    Period Weeks    Status New    Target Date 08/24/20      PT SHORT TERM GOAL #3   Title Patient will report a 3 lb weiht loss    Time 3    Period Weeks    Status New    Target Date 08/24/20             PT Long Term Goals - 08/03/20 1347      PT LONG TERM GOAL #1   Title  Patient will go up/down 8 steps with minimal discomfort in order to get to her room    Time 6    Period Weeks    Status New    Target Date 09/14/20      PT LONG TERM GOAL #2   Title Patient will be independent with complete pool and land program to promote wiehgt loss and increase left LE strength and stability.    Time 6    Period Weeks    Status New    Target Date 09/14/20                 Plan - 08/10/20 1648    Clinical Impression Statement pt easily distractd today.  Father left for 35 mins during session then returned.  Pt   was more on task when he was present following directions with improvement.  She is more comfortable in aquatic setting today.    Personal Factors and Comorbidities Behavior Pattern;Comorbidity 1;Comorbidity 2;Past/Current Experience;Fitness;Time since onset of injury/illness/exacerbation    Comorbidities DMII, BMI < 70; Congential defomity of the left leg; Parder Willi syndrome,    Examination-Activity Limitations  Bend;Lift;Locomotion Level;Squat;Stand;Stairs;Sit;Carry;Transfers    Examination-Participation Restrictions Cleaning;Laundry;Shop;Community Activity    Clinical Decision Making High    Rehab Potential Fair    PT Frequency 2x / week    PT Duration 8 weeks    PT Treatment/Interventions ADLs/Self Care Home Management;Aquatic Therapy;Moist Heat;Ultrasound;Stair training;Functional mobility training;Gait training;Therapeutic activities;Therapeutic exercise;Patient/family education;Manual techniques;Passive range of motion;Taping    PT Next Visit Plan observe left shld scabbed area to ensure no open wound.    PT Home Exercise Plan Access Code: [email or web address]  URL: [email or web address]  Date: 08/03/2020  Prepared by: Lorayne Bender    Exercises  Seated Long Arc Quad [phone] Degree Range) - 1 x daily - 7 x weekly - 3 sets - 10 reps  Seated Hamstring Curls with Resistance - 1 x daily - 7 x weekly - 3 sets - 10 reps  Seated Hip Abduction with Resistance - 1 x daily - 7 x weekly - 3 sets - 10 reps  Seated Heel Raise - 1 x daily - 7 x weekly - 3 sets - 10 reps    Consulted and Agree with Plan of Care Family member/caregiver    Family Member Consulted father           Patient will benefit from skilled therapeutic intervention in order to improve the following deficits and impairments:  Abnormal gait,Decreased endurance,Increased muscle spasms,Obesity,Pain,Decreased activity tolerance,Decreased safety awareness,Decreased strength,Difficulty walking  Visit Diagnosis: Chronic pain of left knee  Other abnormalities of gait and mobility  Muscle weakness (generalized)     Problem List Patient Active Problem List   Diagnosis Date Noted  . Body mass index (BMI) of 60.0-69.9 in adult (HCC) 08/02/2020  . Elevated BP without diagnosis of hypertension 08/02/2020  . Elevated cholesterol 08/02/2020  . Encounter to establish care 07/20/2020  . Pain in joint involving pelvic region and thigh, bilateral  07/20/2020  . Bilateral chronic knee pain 07/20/2020  . Open wound of skin 07/20/2020  . Self-injurious behavior 11/06/2016  . Type 2 diabetes mellitus (HCC) 11/06/2016  . 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 Scoliosis 05/05/2011  . Prader-Willi syndrome 05/30/2007    Jeanmarie Hubert MPT 08/10/2020, 4:55 PM  [facility] [address] [place], [place], [address] Phone: [phone]   Fax:  [phone]  Name: [name] MRN: [id number] Date of Birth:  02/20/2001   

## 2020-08-12 ENCOUNTER — Encounter (HOSPITAL_BASED_OUTPATIENT_CLINIC_OR_DEPARTMENT_OTHER): Payer: Self-pay | Admitting: Physical Therapy

## 2020-08-12 ENCOUNTER — Ambulatory Visit (HOSPITAL_BASED_OUTPATIENT_CLINIC_OR_DEPARTMENT_OTHER): Payer: Medicare Other | Admitting: Physical Therapy

## 2020-08-12 ENCOUNTER — Other Ambulatory Visit: Payer: Self-pay

## 2020-08-12 DIAGNOSIS — R2689 Other abnormalities of gait and mobility: Secondary | ICD-10-CM

## 2020-08-12 DIAGNOSIS — G8929 Other chronic pain: Secondary | ICD-10-CM

## 2020-08-12 DIAGNOSIS — M25562 Pain in left knee: Secondary | ICD-10-CM | POA: Diagnosis not present

## 2020-08-12 DIAGNOSIS — M6281 Muscle weakness (generalized): Secondary | ICD-10-CM

## 2020-08-12 NOTE — Therapy (Signed)
[facility] [address] [place], [place], [address] Phone: [phone]   Fax:  [phone]  Physical Therapy Treatment  Patient Details  Name: [name] MRN: [id number] Date of Birth: [birth year] Referring Provider (PT): Enid Skeens NP   Encounter Date: 08/12/2020   PT End of Session - 08/12/20 1317    Visit Number 4    Number of Visits 16    Date for PT Re-Evaluation 09/28/20    Authorization Type medicare progress note at 10th visit    PT Start Time 0902    PT Stop Time 0945    PT Time Calculation (min) 43 min    Activity Tolerance Patient tolerated treatment well;Other (comment)    Behavior During Therapy WFL for tasks assessed/performed           Past Medical History:  Diagnosis Date  . Birth defect   . Body mass index (BMI) of 70 or greater in adult [facility]) 07/20/2020  . Encounter for routine laboratory testing 07/20/2020    Past Surgical History:  Procedure Laterality Date  . KNEE SURGERY      There were no vitals filed for this visit.   Subjective Assessment - 08/12/20 1316    Subjective Pt reports no pain, felt very tired after last session    Patient is accompained by: Family member          TREATMENT Pt seen for aquatic therapy today.  Treatment took place in water 3.25-4.8 ft in depth at the [facility] pool. Temp of water was 89.  Pt entered/exited the pool via stairs (step to pattern with CGA)  with bilat rail. Warm up and aerobic capacity training: sidestepping, forward and backward walking holding to PT hands then progressing to pushing kick board, increasing step length and speed. Strengthening of LE's and core: jumping, kicking, scissoring ( with and without foam cuffs), planks with noodle and kickboard Pt requires buoyancy for support and to offload joints with strengthening exercises. Viscosity of the water is needed for resistance of strengthening; water current perturbations  provides challenge to standing balance unsupported, requiring increased core activation.                             PT Short Term Goals - 08/03/20 1346      PT SHORT TERM GOAL #1   Title Patient will be indepdnent with base exercise program    Time 3    Period Weeks    Status New    Target Date 08/24/20      PT SHORT TERM GOAL #2   Title Patient will increase gross LE strength by 5 lbs    Time 3    Period Weeks    Status New    Target Date 08/24/20      PT SHORT TERM GOAL #3   Title Patient will report a 3 lb weiht loss    Time 3    Period Weeks    Status New    Target Date 08/24/20             PT Long Term Goals - 08/03/20 1347      PT LONG TERM GOAL #1   Title Patient will go up/down 8 steps with minimal discomfort in order to get to her room    Time 6    Period Weeks    Status New    Target Date 09/14/20  PT LONG TERM GOAL #2   Title Patient will be independent with complete pool and land program to promote wiehgt loss and increase left LE strength and stability.    Time 6    Period Weeks    Status New    Target Date 09/14/20                 Plan - 08/12/20 1810    Clinical Impression Statement Concentration today is on keeping pt in continuous motion emcouraging strengthening and improving aerobic capacity.  Time on task improved.  Was able to work on core strength more effectively with pts increased confidence in aquatic setting engaging her with use of kick boards and noodles.    Personal Factors and Comorbidities Behavior Pattern;Comorbidity 1;Comorbidity 2;Past/Current Experience;Fitness;Time since onset of injury/illness/exacerbation    Comorbidities DMII, BMI < 70; Congential defomity of the left leg; Parder Willi syndrome,    Examination-Activity Limitations Bend;Lift;Locomotion Level;Squat;Stand;Stairs;Sit;Carry;Transfers    Examination-Participation Restrictions Cleaning;Laundry;Shop;Community Activity     Stability/Clinical Decision Making Evolving/Moderate complexity    Clinical Decision Making High    Rehab Potential Fair    PT Frequency 2x / week    PT Duration 8 weeks    PT Treatment/Interventions ADLs/Self Care Home Management;Aquatic Therapy;Moist Heat;Ultrasound;Stair training;Functional mobility training;Gait training;Therapeutic activities;Therapeutic exercise;Patient/family education;Manual techniques;Passive range of motion;Taping    PT Next Visit Plan advance strengthening in deeper water    PT Home Exercise Plan Access Code: [email or web address]  URL: [email or web address]  Date: 08/03/2020  Prepared by: Lorayne Bender    Exercises  Seated Long Arc Quad [phone] Degree Range) - 1 x daily - 7 x weekly - 3 sets - 10 reps  Seated Hamstring Curls with Resistance - 1 x daily - 7 x weekly - 3 sets - 10 reps  Seated Hip Abduction with Resistance - 1 x daily - 7 x weekly - 3 sets - 10 reps  Seated Heel Raise - 1 x daily - 7 x weekly - 3 sets - 10 reps    Consulted and Agree with Plan of Care Family member/caregiver    Family Member Consulted father           Patient will benefit from skilled therapeutic intervention in order to improve the following deficits and impairments:  Abnormal gait,Decreased endurance,Increased muscle spasms,Obesity,Pain,Decreased activity tolerance,Decreased safety awareness,Decreased strength,Difficulty walking  Visit Diagnosis: Chronic pain of left knee  Other abnormalities of gait and mobility  Muscle weakness (generalized)     Problem List Patient Active Problem List   Diagnosis Date Noted  . Body mass index (BMI) of 60.0-69.9 in adult (HCC) 08/02/2020  . Elevated BP without diagnosis of hypertension 08/02/2020  . Elevated cholesterol 08/02/2020  . Encounter to establish care 07/20/2020  . Pain in joint involving pelvic region and thigh, bilateral 07/20/2020  . Bilateral chronic knee pain 07/20/2020  . Open wound of skin 07/20/2020  .  Self-injurious behavior 11/06/2016  . Type 2 diabetes mellitus (HCC) 11/06/2016  . 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 Scoliosis 05/05/2011  . Prader-Willi syndrome 05/30/2007    Jeanmarie Hubert MPT 08/12/2020, 6:16 PM  [facility] [address] [place], [place], [address] Phone: [phone]   Fax:  [phone]  Name: [name] MRN: [id number] Date of Birth: 2000-12-16

## 2020-08-17 ENCOUNTER — Ambulatory Visit (HOSPITAL_BASED_OUTPATIENT_CLINIC_OR_DEPARTMENT_OTHER): Payer: Medicare Other | Admitting: Nurse Practitioner

## 2020-08-17 ENCOUNTER — Ambulatory Visit (HOSPITAL_BASED_OUTPATIENT_CLINIC_OR_DEPARTMENT_OTHER): Payer: Medicare Other | Admitting: Physical Therapy

## 2020-08-19 ENCOUNTER — Ambulatory Visit (HOSPITAL_BASED_OUTPATIENT_CLINIC_OR_DEPARTMENT_OTHER): Payer: Medicare Other | Admitting: Physical Therapy

## 2020-08-24 ENCOUNTER — Ambulatory Visit (HOSPITAL_BASED_OUTPATIENT_CLINIC_OR_DEPARTMENT_OTHER): Payer: Medicare Other | Admitting: Physical Therapy

## 2020-08-26 ENCOUNTER — Encounter (HOSPITAL_BASED_OUTPATIENT_CLINIC_OR_DEPARTMENT_OTHER): Payer: Self-pay | Admitting: Physical Therapy

## 2020-08-26 ENCOUNTER — Other Ambulatory Visit: Payer: Self-pay

## 2020-08-26 ENCOUNTER — Ambulatory Visit (HOSPITAL_BASED_OUTPATIENT_CLINIC_OR_DEPARTMENT_OTHER): Payer: Medicare Other | Attending: Nurse Practitioner | Admitting: Physical Therapy

## 2020-08-26 DIAGNOSIS — M25562 Pain in left knee: Secondary | ICD-10-CM | POA: Insufficient documentation

## 2020-08-26 DIAGNOSIS — R2689 Other abnormalities of gait and mobility: Secondary | ICD-10-CM

## 2020-08-26 DIAGNOSIS — G8929 Other chronic pain: Secondary | ICD-10-CM | POA: Insufficient documentation

## 2020-08-26 DIAGNOSIS — M6281 Muscle weakness (generalized): Secondary | ICD-10-CM | POA: Insufficient documentation

## 2020-08-26 NOTE — Therapy (Addendum)
[facility] [address] [place], [place], [address] Phone: [phone]   Fax:  [phone]  Physical Therapy Treatment  Patient Details  Name: MADOLYN ACKROYD MRN: [id number] Date of Birth: 05/08/2000 Referring Provider (PT): [name] NP   Encounter Date: 08/26/2020   PT End of Session - 08/26/20 1020     Visit Number 5    Number of Visits 16    Date for PT Re-Evaluation 09/28/20    Authorization Type medicare progress note at 10th visit    PT Start Time 0900    PT Stop Time 0945    PT Time Calculation (min) 45 min    Equipment Utilized During Treatment Other (comment)    Activity Tolerance Patient tolerated treatment well;Other (comment)    Behavior During Therapy WFL for tasks assessed/performed             Past Medical History:  Diagnosis Date   Birth defect    Body mass index (BMI) of 70 or greater in adult [facility]) 07/20/2020   Encounter for routine laboratory testing 07/20/2020    Past Surgical History:  Procedure Laterality Date   KNEE SURGERY      There were no vitals filed for this visit.   Subjective:  Father reports compliance with HEP as well as coming to pool.  [name] is excited to be here and anxious to begin   pain- Lt knee, aching                         PT Short Term Goals - 08/26/20 1021       PT SHORT TERM GOAL #1   Status Achieved      PT SHORT TERM GOAL #3   Status On-going               PT Long Term Goals - 08/03/20 1347       PT LONG TERM GOAL #1   Title Patient will go up/down 8 steps with minimal discomfort in order to get to her room    Time 6    Period Weeks    Status New    Target Date 09/14/20      PT LONG TERM GOAL #2   Title Patient will be independent with complete pool and land program to promote wiehgt loss and increase left LE strength and stability.    Time 6    Period Weeks    Status New    Target Date 09/14/20                 Limitations Lifting;Standing             TREATMENT Pt seen for aquatic therapy today.  Treatment took place in water 3.25-4.8 ft in depth at the [facility] pool. Temp of water was 90.  Pt entered/exited the pool via stairs step to pattern cga-sba with bilat rail. Warm up: forward, backward and side stepping/walking across pool and along pools edge holding to wall progressing to w/o UE support. Encouraged exercises for strengthening and aerobic capacity challenges: sitting on bench as well as standing , marching, flutter and scissor kicking. Jumping and fast walking/running, forward and backward width of pool x 10 reps with rest periods between. Added ankle buoys for resistance and barbells for balance challenges.  UE: using barbells; horizontal abd/add, shoulder flex and abd 2 x 10. Pt given constant VC for time on task Static balance challeges unsupported for  20-30 secs x 3 pushing noodle down horizontally.  Pt requires buoyancy for support and to offload joints with strengthening exercises. Viscosity of the water is needed for resistance of strengthening; water current perturbations provides challenge to standing balance unsupported, requiring increased core activation   Plan:  Pt remained in constant motion for 30-35 mins challenges and demonstrating improved aerobic capcity. As Pt becomes more confident in aquatic setting she has been less apprehensive to participate in more activites which will assist with weight loss and improve strength for fxn mobility. ! STG met  Pt will benefit from skilled therapeutic intervention in order to improve on the following deficits Abnormal gait; Decreased endurance; Increased muscle spasms; Obesity; Pain; Decreased activity tolerance; Decreased safety awareness; Decreased strength; Difficulty walking   Treatment/Interventions ADLs/Self Care Home Management; Aquatic Therapy; Moist Heat; Ultrasound; Stair training; Functional mobility  training; Gait training; Therapeutic activities; Therapeutic exercise; Patient/family education; Manual techniques; Passive range of motion; Taping    Visit Diagnosis: Chronic pain of left knee  Other abnormalities of gait and mobility  Muscle weakness (generalized)     Problem List Patient Active Problem List   Diagnosis Date Noted   Body mass index (BMI) of 60.0-69.9 in adult ([place]) 08/02/2020   Elevated BP without diagnosis of hypertension 08/02/2020   Elevated cholesterol 08/02/2020   Encounter to establish care 07/20/2020   Pain in joint involving pelvic region and thigh, bilateral 07/20/2020   Bilateral chronic knee pain 07/20/2020   Open wound of skin 07/20/2020   Self-injurious behavior 11/06/2016   Type 2 diabetes mellitus ([place]) 11/06/2016   Tibia vara of left lower extremity 08/09/2016   Scoliosis 05/05/2011   Prader-Willi syndrome 05/30/2007    Vedia Pereyra MPT 08/26/2020, 10:23 AM  [facility] [address] [place], [place], [address] Phone: [phone]   Fax:  [phone]  Name: MESHAWN OCONNOR MRN: [id number] Date of Birth: Jan 10, 2001  Addendum by Gay Filler. Hightower PT, DPT 09/21/20 5:31 PM

## 2020-08-30 ENCOUNTER — Telehealth (INDEPENDENT_AMBULATORY_CARE_PROVIDER_SITE_OTHER): Payer: Medicare Other | Admitting: Nurse Practitioner

## 2020-08-30 ENCOUNTER — Encounter (HOSPITAL_BASED_OUTPATIENT_CLINIC_OR_DEPARTMENT_OTHER): Payer: Self-pay | Admitting: Nurse Practitioner

## 2020-08-30 ENCOUNTER — Other Ambulatory Visit: Payer: Self-pay

## 2020-08-30 DIAGNOSIS — E1165 Type 2 diabetes mellitus with hyperglycemia: Secondary | ICD-10-CM

## 2020-08-30 DIAGNOSIS — Z794 Long term (current) use of insulin: Secondary | ICD-10-CM | POA: Diagnosis not present

## 2020-08-30 NOTE — Assessment & Plan Note (Signed)
Most recent A1c at 7.3% Lantus 30u qHS, Metformin 500mg  AM 500mg  PM, Trulicity 1.5mg  every Wednesday Blood sugars in much improved control- no noticeable changes with weight reported- no changes with appetite reported Father would like to get her off of metformin due to GI upset Pt tolerating medication well Has plans to f/u with endorcine in 3 months, then will continue f/u with me. Plan to increase Trulicity dose at next visit, if not already completed and continue to taper metformin. Monitor BG daily Continue therapy and exercises in pool Report any BG less than 70 or greater than 200

## 2020-08-30 NOTE — Progress Notes (Signed)
Virtual Visit via Telephone Note  I connected with  [name] on 08/30/20 at 10:10 AM EDT by telephone and verified that I am speaking with the correct person using two identifiers.   I discussed the limitations, risks, security and privacy concerns of performing an evaluation and management service by telephone and the availability of in person appointments. I also discussed with the patient that there may be a patient responsible charge related to this service. The patient expressed understanding and agreed to proceed.  Participating parties included in this telephone visit include: The patient, the patients father, and the nurse practitioner listed.  The patient is: At home I am: In the office  Subjective:    CC: DM F/U  HPI: [name] is a 20 y.o. year old female presenting today with her father via telephone visit to discuss her diabetes. The patient has Prader Willi Syndrome and is limited in her interaction, which requires her parents to help facilitate history.   [name]'s father tells me today that she was seen by endocrinology about a month ago and they were in agreement to the medication changes made by me at our last visit.  She is currently taking Lantus 30u every evening Trulicity 1.5mg  once a week Metformin 1000mg  daily  Trulicity was increased on 05/18 to the 1.5mg  dose from the 0.75mg  dose and Metformin was decreased from 1500mg  to 1000mg .  Since the dose change her father reports her blood sugars have been improved. 05/18 166 am fasting, 215 pm 05/19 116 am fasting, 167 pm 05/26 163 am fasting 128 pm 05/27 108 am fasting, 126 pm 05/28 121 am fasting, 109 pm 05/29 96 am fasting, 160 pm 05/30 131 am fasting, 116 pm 06/01 129 am fasting, 113 pm 06/02 109 am fasting, 135 pm 06/03 137 am fasting, 160 pm 06/04 107 am fasting, 148 pm 06/05 127 am fasting, 162 pm  No adverse effects noted from medication dose increase. No decreased appetite noted.    Past medical history, Surgical history, Family history not pertinant except as noted below, Social history, Allergies, and medications have been entered into the medical record, reviewed, and corrections made.   Review of Systems:  All review of systems negative except what is listed in the HPI  Objective:    General:  Patient speaking clearly in complete sentences. No shortness of breath noted.   Alert and oriented x3.   Normal judgment.  No apparent acute distress.  Impression and Recommendations:    1. Type 2 diabetes mellitus with hyperglycemia, with long-term current use of insulin (HCC) Most recent A1c at 7.3% Lantus 30u qHS, Metformin 500mg  AM 500mg  PM, Trulicity 1.5mg  every Wednesday Blood sugars in much improved control- no noticeable changes with weight reported- no changes with appetite reported Father would like to get her off of metformin due to GI upset Pt tolerating medication well Has plans to f/u with endorcine in 3 months, then will continue f/u with me. Plan to increase Trulicity dose at next visit, if not already completed and continue to taper metformin. Monitor BG daily Continue therapy and exercises in pool Report any BG less than 70 or greater than 200   I discussed the assessment and treatment plan with the patient. The patient was provided an opportunity to ask questions and all were answered. The patient agreed with the plan and demonstrated an understanding of the instructions.   The patient was advised to call back or seek an in-person evaluation if the  symptoms worsen or if the condition fails to improve as anticipated.  I provided 22 minutes of non-face-to-face time during this TELEPHONE encounter.    Tollie Eth, NP

## 2020-08-31 ENCOUNTER — Other Ambulatory Visit: Payer: Self-pay

## 2020-08-31 ENCOUNTER — Ambulatory Visit (HOSPITAL_BASED_OUTPATIENT_CLINIC_OR_DEPARTMENT_OTHER): Payer: Medicare Other | Admitting: Physical Therapy

## 2020-08-31 ENCOUNTER — Encounter (HOSPITAL_BASED_OUTPATIENT_CLINIC_OR_DEPARTMENT_OTHER): Payer: Self-pay | Admitting: Physical Therapy

## 2020-08-31 DIAGNOSIS — R2689 Other abnormalities of gait and mobility: Secondary | ICD-10-CM

## 2020-08-31 DIAGNOSIS — G8929 Other chronic pain: Secondary | ICD-10-CM

## 2020-08-31 DIAGNOSIS — M6281 Muscle weakness (generalized): Secondary | ICD-10-CM

## 2020-08-31 DIAGNOSIS — M25562 Pain in left knee: Secondary | ICD-10-CM | POA: Diagnosis not present

## 2020-08-31 NOTE — Therapy (Signed)
[facility] [address] [place], [place], [address] Phone: [phone]   Fax:  [phone]  Physical Therapy Treatment  Patient Details  Name: [name] MRN: [id number] Date of Birth: 05/12/2000 Referring Provider (PT): Enid Skeens NP   Encounter Date: 08/31/2020   PT End of Session - 08/31/20 1000    Visit Number 6    Number of Visits 16    Date for PT Re-Evaluation 09/28/20    Authorization Type medicare progress note at 10th visit    PT Start Time 0902    PT Stop Time 0945    PT Time Calculation (min) 43 min    Equipment Utilized During Treatment Other (comment)    Activity Tolerance Patient tolerated treatment well;Other (comment)    Behavior During Therapy [facility] for tasks assessed/performed   needs constant cues for time on task          Past Medical History:  Diagnosis Date  . Birth defect   . Body mass index (BMI) of 70 or greater in adult [facility]) 07/20/2020  . Encounter for routine laboratory testing 07/20/2020    Past Surgical History:  Procedure Laterality Date  . KNEE SURGERY      There were no vitals filed for this visit.   Subjective Assessment - 08/31/20 0954    Subjective Father reports pt complains of minor discomfort in lb and left knee usually at night.  She is enjoying therapy and is losing weight.    Patient is accompained by: Family member    Pertinent History Prater Willie syndrome,    Limitations Lifting;Standing    How long can you stand comfortably? undefined.  Pt able to complete all ADl's and household functional mobility    How long can you walk comfortably? at minimum 10 mins    Patient Stated Goals patients father would like her to get on an exercise program    Currently in Pain? Yes    Pain Score 4     Pain Location Knee    Pain Orientation Left    Pain Descriptors / Indicators Aching    Pain Onset More than a month ago    Pain Frequency Intermittent    Aggravating  Factors  standing and walking    Pain Relieving Factors rest. aquatic therapy/exercise    Multiple Pain Sites No                 TREATMENT Pt seen for aquatic therapy today. Treatment took place in water 3.25 ft in depth at the [facility] pool. Temp of water was 90. Pt entered/exited the pool via stairs step to pattern cga-sbawith bilat rail. Warm up: forward, backward and side stepping/walkingacross pool and along pools edge holding to wall progressing to w/o UE support. Encouraged exercises for strengthening and aerobic capacity challenges: sitting on bench as well as standing , marching, hip flex and add/abd x 10 reps, ankle buoys for resistance.  Noodle pushdowns with foot x 10 reps. Jumping and fast walking/running, forward and backwards, side stepping and in circles width of pool x 6 continuous laps hand held assist with cueing for "racing" to keep pt engaged.  UE: using hand paddles; horizontal abd/add, shoulder flex and abd 3 trials of 1-2 mins cuing for vigorous movement. Static balance challeges unsupported for 30 reps pushing noodle down horizontally. Pt requires buoyancy for support and to offload joints with strengthening exercises. Viscosity of the water is needed for resistance of strengthening; water current perturbations  provides challenge to standing balance unsupported, requiring increased core activation                      PT Education - 08/31/20 0958    Education Details Instructed father to give her a noddle and a kick board whenever at pool and encourage her to walk and jump, "race" as she responds well to a challenge    Methods Explanation;Demonstration    Comprehension Verbalized understanding            PT Short Term Goals - 08/26/20 1021      PT SHORT TERM GOAL #1   Status Achieved      PT SHORT TERM GOAL #3   Status On-going             PT Long Term Goals - 08/03/20 1347      PT LONG TERM GOAL #1   Title  Patient will go up/down 8 steps with minimal discomfort in order to get to her room    Time 6    Period Weeks    Status New    Target Date 09/14/20      PT LONG TERM GOAL #2   Title Patient will be independent with complete pool and land program to promote wiehgt loss and increase left LE strength and stability.    Time 6    Period Weeks    Status New    Target Date 09/14/20                 Plan - 08/31/20 1001    Clinical Impression Statement Pt responding well today to challenge of "racing" with all activites, strengthening/aerobic capacity training/walking for time on task to encourage continuous movement throught session with end goal to improve mobility/strength as well as to lose weight in prep for surgery.  She remains engaged with cues demonstrating improvement in endurance wlaking at increased speed 6 lengths pool (side). Pt without LOB with all activity today due to improving core strength.    Personal Factors and Comorbidities Behavior Pattern;Comorbidity 1;Comorbidity 2;Past/Current Experience;Fitness;Time since onset of injury/illness/exacerbation    Comorbidities DMII, BMI < 70; Congential defomity of the left leg; Parder Willi syndrome,    Examination-Activity Limitations Bend;Lift;Locomotion Level;Squat;Stand;Stairs;Sit;Carry;Transfers    Examination-Participation Restrictions Cleaning;Laundry;Shop;Community Activity    Stability/Clinical Decision Making Evolving/Moderate complexity    Clinical Decision Making High    Rehab Potential Fair    PT Frequency 2x / week    PT Duration 8 weeks    PT Treatment/Interventions ADLs/Self Care Home Management;Aquatic Therapy;Moist Heat;Ultrasound;Stair training;Functional mobility training;Gait training;Therapeutic activities;Therapeutic exercise;Patient/family education;Manual techniques;Passive range of motion;Taping    PT Next Visit Plan ask about how much weight loss.    PT Home Exercise Plan Access Code: [email or web address]  URL:  [email or web address]  Date: 08/03/2020  Prepared by: Lorayne Bender    Exercises  Seated Long Arc Quad [phone] Degree Range) - 1 x daily - 7 x weekly - 3 sets - 10 reps  Seated Hamstring Curls with Resistance - 1 x daily - 7 x weekly - 3 sets - 10 reps  Seated Hip Abduction with Resistance - 1 x daily - 7 x weekly - 3 sets - 10 reps  Seated Heel Raise - 1 x daily - 7 x weekly - 3 sets - 10 reps    Consulted and Agree with Plan of Care Family member/caregiver    Family Member Consulted father  Patient will benefit from skilled therapeutic intervention in order to improve the following deficits and impairments:  Abnormal gait,Decreased endurance,Increased muscle spasms,Obesity,Pain,Decreased activity tolerance,Decreased safety awareness,Decreased strength,Difficulty walking  Visit Diagnosis: Chronic pain of left knee  Other abnormalities of gait and mobility  Muscle weakness (generalized)     Problem List Patient Active Problem List   Diagnosis Date Noted  . Body mass index (BMI) of 60.0-69.9 in adult (HCC) 08/02/2020  . Elevated BP without diagnosis of hypertension 08/02/2020  . Elevated cholesterol 08/02/2020  . Encounter to establish care 07/20/2020  . Pain in joint involving pelvic region and thigh, bilateral 07/20/2020  . Bilateral chronic knee pain 07/20/2020  . Open wound of skin 07/20/2020  . Self-injurious behavior 11/06/2016  . Type 2 diabetes mellitus with hyperglycemia, with long-term current use of insulin (HCC) 11/06/2016  . 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 Scoliosis 05/05/2011  . Prader-Willi syndrome 05/30/2007    Jeanmarie Hubert MPT 08/31/2020, 10:10 AM  [facility] [address] [place], [place], [address] Phone: [phone]   Fax:  [phone]  Name: HELLENA PRIDGEN MRN: [id number] Date of Birth: [birth year]

## 2020-09-02 ENCOUNTER — Ambulatory Visit (HOSPITAL_BASED_OUTPATIENT_CLINIC_OR_DEPARTMENT_OTHER): Payer: Medicare Other | Admitting: Physical Therapy

## 2020-09-03 ENCOUNTER — Ambulatory Visit (HOSPITAL_BASED_OUTPATIENT_CLINIC_OR_DEPARTMENT_OTHER): Payer: Medicare Other | Admitting: Physical Therapy

## 2020-09-07 ENCOUNTER — Encounter (HOSPITAL_BASED_OUTPATIENT_CLINIC_OR_DEPARTMENT_OTHER): Payer: Self-pay | Admitting: Physical Therapy

## 2020-09-07 ENCOUNTER — Ambulatory Visit (HOSPITAL_BASED_OUTPATIENT_CLINIC_OR_DEPARTMENT_OTHER): Payer: Medicare Other | Admitting: Physical Therapy

## 2020-09-07 ENCOUNTER — Other Ambulatory Visit: Payer: Self-pay

## 2020-09-07 DIAGNOSIS — M25562 Pain in left knee: Secondary | ICD-10-CM

## 2020-09-07 DIAGNOSIS — M6281 Muscle weakness (generalized): Secondary | ICD-10-CM

## 2020-09-07 DIAGNOSIS — R2689 Other abnormalities of gait and mobility: Secondary | ICD-10-CM

## 2020-09-07 NOTE — Therapy (Signed)
[facility] [address] [place], [place], [address] Phone: [phone]   Fax:  [phone]  Physical Therapy Treatment  Patient Details  Name: [name] MRN: [id number] Date of Birth: [birth year] Referring Provider (PT): Enid Skeens NP   Encounter Date: 09/07/2020   PT End of Session - 09/07/20 0904     Visit Number 7    Number of Visits 16    Date for PT Re-Evaluation 09/28/20    Authorization Type medicare progress note at 10th visit    PT Start Time 0904    PT Stop Time 0942    PT Time Calculation (min) 38 min    Equipment Utilized During Treatment Other (comment)    Activity Tolerance Patient tolerated treatment well;Other (comment)    Behavior During Therapy WFL for tasks assessed/performed             Past Medical History:  Diagnosis Date   Birth defect    Body mass index (BMI) of 70 or greater in adult [facility]) 07/20/2020   Encounter for routine laboratory testing 07/20/2020    Past Surgical History:  Procedure Laterality Date   KNEE SURGERY      There were no vitals filed for this visit.   Subjective Assessment - 09/07/20 0900     Subjective Father repoorts pt is in a good mood today.    Patient is accompained by: Family member    Pertinent History Prater Willie syndrome,    Limitations Lifting;Standing    How long can you stand comfortably? undefined.  Pt able to complete all ADl's and household functional mobility    How long can you walk comfortably? at minimum 10 mins    Patient Stated Goals patients father would like her to get on an exercise program    Currently in Pain? Yes    Pain Score 4     Pain Orientation Left    Pain Descriptors / Indicators Aching    Pain Onset More than a month ago    Pain Frequency Intermittent    Aggravating Factors  standing and walking    Pain Relieving Factors weightbearing    Effect of Pain on Daily Activities difficulty with amb    Multiple Pain Sites  No            TREATMENT Pt seen for aquatic therapy today.  Treatment took place in water 3.25 ft in depth at the [facility] pool. Temp of water was 90.  Pt entered pool sitting on side then standing on water bench, exited the pool via stairs step to pattern cga-sba with bilat rail. Warm up: forward, backward and side stepping/walking across pool and along pools edge holding to wall progressing to w/o UE support. Sitting on 3rd step pt completes flutter kicking and abd/add x 1-2 mins Encouraged exercises for strengthening and aerobic capacity challenges: sitting on bench as well as standing , marching, hip ext and add/abd x 10 reps, ankle buoys for resistance.  Jumping and fast walking/running, forward and backwards, and side stepping width of pool  2 trials x 6 continuous laps hand held assist with cueing for "racing" to keep pt engaged.   Balance and core strengthening kick board push downs 3 trials of 1-2 mins cuing for vigorous movement. Pt requiring cuing for moving off wall for core strengthening. Pt requires buoyancy for support and to offload joints with strengthening exercises. Viscosity of the water is needed for resistance of strengthening; water current perturbations provides  challenge to standing balance unsupported, requiring increased core activation                             PT Short Term Goals - 08/26/20 1021       PT SHORT TERM GOAL #1   Status Achieved      PT SHORT TERM GOAL #3   Status On-going               PT Long Term Goals - 08/03/20 1347       PT LONG TERM GOAL #1   Title Patient will go up/down 8 steps with minimal discomfort in order to get to her room    Time 6    Period Weeks    Status New    Target Date 09/14/20      PT LONG TERM GOAL #2   Title Patient will be independent with complete pool and land program to promote wiehgt loss and increase left LE strength and stability.    Time 6    Period Weeks     Status New    Target Date 09/14/20                   Plan - 09/07/20 1218     Clinical Impression Statement Pt in good mood.  Able to follow directions and maintain time on task for most of session.  She engages in "racing" with most activities.  Successful in increasing pace and duration of activities for aerboic capacity training.  Has not been weighted in a few weeks.  Father reports that pt "sneaks" food throughout day and night making it difficult for them to control.    Personal Factors and Comorbidities Behavior Pattern;Comorbidity 1;Comorbidity 2;Past/Current Experience;Fitness;Time since onset of injury/illness/exacerbation    Comorbidities DMII, BMI < 70; Congential defomity of the left leg; Parder Willi syndrome,    Examination-Activity Limitations Bend;Lift;Locomotion Level;Squat;Stand;Stairs;Sit;Carry;Transfers    Examination-Participation Restrictions Cleaning;Laundry;Shop;Community Activity    Stability/Clinical Decision Making Evolving/Moderate complexity    Clinical Decision Making High    Rehab Potential Fair    PT Treatment/Interventions ADLs/Self Care Home Management;Aquatic Therapy;Moist Heat;Ultrasound;Stair training;Functional mobility training;Gait training;Therapeutic activities;Therapeutic exercise;Patient/family education;Manual techniques;Passive range of motion;Taping    PT Next Visit Plan weight pt in locker room    PT Home Exercise Plan Access Code: [facility]  URL: [email or web address]  Date: 08/03/2020  Prepared by: Lorayne Bender    Exercises  Seated Long Arc Quad [phone] Degree Range) - 1 x daily - 7 x weekly - 3 sets - 10 reps  Seated Hamstring Curls with Resistance - 1 x daily - 7 x weekly - 3 sets - 10 reps  Seated Hip Abduction with Resistance - 1 x daily - 7 x weekly - 3 sets - 10 reps  Seated Heel Raise - 1 x daily - 7 x weekly - 3 sets - 10 reps    Consulted and Agree with Plan of Care Family member/caregiver    Family Member  Consulted father             Patient will benefit from skilled therapeutic intervention in order to improve the following deficits and impairments:  Abnormal gait, Decreased endurance, Increased muscle spasms, Obesity, Pain, Decreased activity tolerance, Decreased safety awareness, Decreased strength, Difficulty walking  Visit Diagnosis: Chronic pain of left knee  Other abnormalities of gait and mobility  Muscle weakness (generalized)     Problem List Patient Active Problem List  Diagnosis Date Noted   Body mass index (BMI) of 60.0-69.9 in adult (HCC) 08/02/2020   Elevated BP without diagnosis of hypertension 08/02/2020   Elevated cholesterol 08/02/2020   Encounter to establish care 07/20/2020   Pain in joint involving pelvic region and thigh, bilateral 07/20/2020   Bilateral chronic knee pain 07/20/2020   Open wound of skin 07/20/2020   Self-injurious behavior 11/06/2016   Type 2 diabetes mellitus with hyperglycemia, with long-term current use of insulin (HCC) 11/06/2016   Tibia vara of left lower extremity 08/09/2016   Scoliosis 05/05/2011   Prader-Willi syndrome 05/30/2007    Jeanmarie Hubert  MPT 09/07/2020, 12:24 PM  [facility] [address] [place], [place], [address] Phone: [phone]   Fax:  [phone]  Name: [name] MRN: [id number] Date of Birth: [birth year]

## 2020-09-09 ENCOUNTER — Ambulatory Visit (HOSPITAL_BASED_OUTPATIENT_CLINIC_OR_DEPARTMENT_OTHER): Payer: Medicare Other | Admitting: Physical Therapy

## 2020-09-09 ENCOUNTER — Other Ambulatory Visit: Payer: Self-pay

## 2020-09-09 ENCOUNTER — Encounter (HOSPITAL_BASED_OUTPATIENT_CLINIC_OR_DEPARTMENT_OTHER): Payer: Self-pay | Admitting: Physical Therapy

## 2020-09-09 DIAGNOSIS — M6281 Muscle weakness (generalized): Secondary | ICD-10-CM

## 2020-09-09 DIAGNOSIS — R2689 Other abnormalities of gait and mobility: Secondary | ICD-10-CM

## 2020-09-09 DIAGNOSIS — M25562 Pain in left knee: Secondary | ICD-10-CM

## 2020-09-09 NOTE — Therapy (Addendum)
[facility] [address] [place], [place], [address] Phone: [phone]   Fax:  [phone]  Physical Therapy Treatment  Patient Details  Name: [name] MRN: [id number] Date of Birth: 2000-05-03 Referring Provider (PT): Enid Skeens NP   Encounter Date: 09/09/2020   PT End of Session - 09/09/20 0901     Visit Number 8    Number of Visits 16    Date for PT Re-Evaluation 09/28/20    Authorization Type medicare progress note at 10th visit    PT Start Time 0900             Past Medical History:  Diagnosis Date   Birth defect    Body mass index (BMI) of 70 or greater in adult [facility]) 07/20/2020   Encounter for routine laboratory testing 07/20/2020    Past Surgical History:  Procedure Laterality Date   KNEE SURGERY      There were no vitals filed for this visit.   Subjective Assessment - 09/09/20 0905     Subjective weight 370.5 taken today in locker room.  Pt reports feeling well.    Patient is accompained by: Family member    Pertinent History Prater Willie syndrome,    Limitations Lifting;Standing            Pt seen for aquatic therapy today.  Treatment took place in water 3.25 ft in depth at the [facility] pool. Temp of water was 90.  Pt entered pool sitting on side then standing on water bench, exited the pool via stairs step to pattern cga-sba with bilat rail.  Warm up: forward, backward and side stepping/walking across pool and along pools edge holding to wall progressing to w/o UE support. Sitting on 3rd step pt completes flutter kicking and abd/add x 5   mins Encouraged exercises for strengthening and aerobic capacity challenge Hip flexor stretch with noodle then assisted pull through for strengthening 2 x 10 reps Jumping and fast walking/running, forward and backwards, and side stepping width of pool  2 trials x 6 continuous laps hand held assist with cueing for "racing" to keep pt  engaged.    Balance and core strengthening barbell push downs 3 trials of 1-2 mins cuing for vigorous movement. Pt completes leaning on wall.  Pt requiring constant verbal and tactile cues for time on task.  Pt requires buoyancy for support and to offload joints with strengthening exercises. Viscosity of the water is needed for resistance of strengthening; water current perturbations provides challenge to standing balance unsupported, requiring increased core activation                           PT Education - 09/09/20 0906     Education Details Discussed pts weight with father.              PT Short Term Goals - 08/26/20 1021       PT SHORT TERM GOAL #1   Status Achieved      PT SHORT TERM GOAL #3   Status On-going               PT Long Term Goals - 08/03/20 1347       PT LONG TERM GOAL #1   Title Patient will go up/down 8 steps with minimal discomfort in order to get to her room    Time 6    Period Weeks    Status New  Target Date 09/14/20      PT LONG TERM GOAL #2   Title Patient will be independent with complete pool and land program to promote wiehgt loss and increase left LE strength and stability.    Time 6    Period Weeks    Status New    Target Date 09/14/20            Clinincal Assessment Focus is keeping pt in motion throughout session for strengthening and aerobic challenging. weight loss questionable. Strength improvements as demonstrated through tolerated added hip ext stretches and ex        Patient will benefit from skilled therapeutic intervention in order to improve the following deficits and impairments:     Visit Diagnosis: Chronic pain of left knee  Other abnormalities of gait and mobility  Muscle weakness (generalized)     Problem List Patient Active Problem List   Diagnosis Date Noted   Body mass index (BMI) of 60.0-69.9 in adult (HCC) 08/02/2020   Elevated BP without diagnosis of  hypertension 08/02/2020   Elevated cholesterol 08/02/2020   Encounter to establish care 07/20/2020   Pain in joint involving pelvic region and thigh, bilateral 07/20/2020   Bilateral chronic knee pain 07/20/2020   Open wound of skin 07/20/2020   Self-injurious behavior 11/06/2016   Type 2 diabetes mellitus with hyperglycemia, with long-term current use of insulin (HCC) 11/06/2016   Tibia vara of left lower extremity 08/09/2016   Scoliosis 05/05/2011   Prader-Willi syndrome 05/30/2007    Elon Alas Rashi Giuliani MPT 09/09/2020, 9:08 AM  [facility] [address] [place], [place], [address] Phone: [phone]   Fax:  [phone]  Name: [name] MRN: [id number] Date of Birth: 2000/10/11  Addended by: Delores Thelen Tomma Lightning) [place] MPT

## 2020-09-14 ENCOUNTER — Ambulatory Visit (HOSPITAL_BASED_OUTPATIENT_CLINIC_OR_DEPARTMENT_OTHER): Payer: Medicare Other | Admitting: Physical Therapy

## 2020-09-16 ENCOUNTER — Ambulatory Visit (HOSPITAL_BASED_OUTPATIENT_CLINIC_OR_DEPARTMENT_OTHER): Payer: Medicare Other | Admitting: Physical Therapy

## 2020-09-22 ENCOUNTER — Other Ambulatory Visit: Payer: Self-pay

## 2020-09-22 ENCOUNTER — Ambulatory Visit (HOSPITAL_BASED_OUTPATIENT_CLINIC_OR_DEPARTMENT_OTHER): Payer: Medicare Other | Admitting: Physical Therapy

## 2020-09-22 ENCOUNTER — Encounter (HOSPITAL_BASED_OUTPATIENT_CLINIC_OR_DEPARTMENT_OTHER): Payer: Self-pay | Admitting: Physical Therapy

## 2020-09-22 DIAGNOSIS — M6281 Muscle weakness (generalized): Secondary | ICD-10-CM

## 2020-09-22 DIAGNOSIS — M25562 Pain in left knee: Secondary | ICD-10-CM

## 2020-09-22 DIAGNOSIS — R2689 Other abnormalities of gait and mobility: Secondary | ICD-10-CM

## 2020-09-22 DIAGNOSIS — G8929 Other chronic pain: Secondary | ICD-10-CM

## 2020-09-22 NOTE — Therapy (Signed)
[facility] [address] [place], [place], [address] Phone: [phone]   Fax:  [phone]  Physical Therapy Treatment  Patient Details  Name: [name] MRN: [id number] Date of Birth: 20-Sep-2000 Referring Provider (PT): Enid Skeens NP   Encounter Date: 09/22/2020   PT End of Session - 09/22/20 1135     Visit Number 9    Number of Visits 16    Date for PT Re-Evaluation 09/28/20    Authorization Type medicare progress note at 10th visit    PT Start Time 1030    PT Stop Time 1115    PT Time Calculation (min) 45 min    Equipment Utilized During Treatment Other (comment)    Activity Tolerance Patient tolerated treatment well;Other (comment)    Behavior During Therapy WFL for tasks assessed/performed             Past Medical History:  Diagnosis Date   Birth defect    Body mass index (BMI) of 70 or greater in adult [facility]) 07/20/2020   Encounter for routine laboratory testing 07/20/2020    Past Surgical History:  Procedure Laterality Date   KNEE SURGERY      There were no vitals filed for this visit.   Subjective Assessment - 09/22/20 1119     Subjective Ptwithout complaint.  Has missed last few visits.  Father reports having her in the pool x 2 since last visit.    Patient is accompained by: Family member    Pertinent History Prater Willie syndrome,    Limitations Lifting;Standing    How long can you stand comfortably? undefined.  Pt able to complete all ADl's and household functional mobility    How long can you walk comfortably? at minimum 10 mins    Pain Score 4     Pain Location Knee    Pain Orientation Left    Pain Descriptors / Indicators Aching    Pain Type Chronic pain    Pain Onset More than a month ago              Pt seen for aquatic therapy today.  Treatment took place in water 3.25 ft in depth at the [facility] pool. Temp of water was 90.  Pt entered pool sitting on side  then standing on water bench, exited the pool via stairs step to pattern cga-sba with bilat rail.   Warm up: forward, backward and side stepping/walking across pool and along pools edge holding to wall progressing to w/o UE support. Sitting on 3rd step pt completes flutter kicking and abd/add x 5   mins Encouraged exercises for strengthening and aerobic capacity challenge  Jumping and fast walking/running, forward and backwards, and side stepping width of pool  2 trials 1st trial x 4 mins second trial x 5 mins  hand held assist with cueing for "racing" to keep pt engaged.    Modified prone/superman position With encouragement and coersion pt attained position modified at steps using ankle buoys holding to handrails assisted into position by PT for multiple short periods of time, cuing for flutter kicking and pulling feet to floor (abdominals)   Pt requiring constant verbal and tactile cues for time on task.   Pt requires buoyancy for support and to offload joints with strengthening exercises. Viscosity of the water is needed for resistance of strengthening; water current perturbations provides challenge to standing balance unsupported, requiring increased core activation  PT Short Term Goals - 08/26/20 1021       PT SHORT TERM GOAL #1   Status Achieved      PT SHORT TERM GOAL #3   Status On-going               PT Long Term Goals - 09/22/20 1357       PT LONG TERM GOAL #2   Status Achieved                   Plan - 09/22/20 1136     Clinical Impression Statement Pt with improvement in core strength as demonstrated by ability to perform kick down in superman positon.  Improved comfort in aquatic setting as strength improves.  Time on task good today.    Personal Factors and Comorbidities Behavior Pattern;Comorbidity 1;Comorbidity 2;Past/Current Experience;Fitness;Time since onset of injury/illness/exacerbation     Comorbidities DMII, BMI < 70; Congential defomity of the left leg; Parder Willi syndrome,    Examination-Activity Limitations Bend;Lift;Locomotion Level;Squat;Stand;Stairs;Sit;Carry;Transfers    Examination-Participation Restrictions Cleaning;Laundry;Shop;Community Activity    Stability/Clinical Decision Making Evolving/Moderate complexity    Clinical Decision Making High    Rehab Potential Fair    PT Frequency 2x / week    PT Duration 8 weeks    PT Treatment/Interventions ADLs/Self Care Home Management;Aquatic Therapy;Moist Heat;Ultrasound;Stair training;Functional mobility training;Gait training;Therapeutic activities;Therapeutic exercise;Patient/family education;Manual techniques;Passive range of motion;Taping    PT Home Exercise Plan Access Code: [email or web address]  URL: [email or web address]  Date: 08/03/2020  Prepared by: Lorayne Bender    Exercises  Seated Long Arc Quad [phone] Degree Range) - 1 x daily - 7 x weekly - 3 sets - 10 reps  Seated Hamstring Curls with Resistance - 1 x daily - 7 x weekly - 3 sets - 10 reps  Seated Hip Abduction with Resistance - 1 x daily - 7 x weekly - 3 sets - 10 reps  Seated Heel Raise - 1 x daily - 7 x weekly - 3 sets - 10 reps             Patient will benefit from skilled therapeutic intervention in order to improve the following deficits and impairments:  Abnormal gait, Decreased endurance, Increased muscle spasms, Obesity, Pain, Decreased activity tolerance, Decreased safety awareness, Decreased strength, Difficulty walking  Visit Diagnosis: Chronic pain of left knee  Other abnormalities of gait and mobility  Muscle weakness (generalized)     Problem List Patient Active Problem List   Diagnosis Date Noted   Body mass index (BMI) of 60.0-69.9 in adult (HCC) 08/02/2020   Elevated BP without diagnosis of hypertension 08/02/2020   Elevated cholesterol 08/02/2020   Encounter to establish care 07/20/2020   Pain in joint involving pelvic region  and thigh, bilateral 07/20/2020   Bilateral chronic knee pain 07/20/2020   Open wound of skin 07/20/2020   Self-injurious behavior 11/06/2016   Type 2 diabetes mellitus with hyperglycemia, with long-term current use of insulin (HCC) 11/06/2016   Tibia vara of left lower extremity 08/09/2016   Scoliosis 05/05/2011   Prader-Willi syndrome 05/30/2007    Jeanmarie Hubert MPT 09/22/2020, 2:01 PM  [facility] [address] [place], [place], [address] Phone: [phone]   Fax:  [phone]  Name: SHEALYNN SAULNIER MRN: [id number] Date of Birth: [birth year]

## 2020-09-24 ENCOUNTER — Other Ambulatory Visit: Payer: Self-pay

## 2020-09-24 ENCOUNTER — Encounter (HOSPITAL_BASED_OUTPATIENT_CLINIC_OR_DEPARTMENT_OTHER): Payer: Self-pay | Admitting: Physical Therapy

## 2020-09-24 ENCOUNTER — Ambulatory Visit (HOSPITAL_BASED_OUTPATIENT_CLINIC_OR_DEPARTMENT_OTHER): Payer: Medicare Other | Attending: Nurse Practitioner | Admitting: Physical Therapy

## 2020-09-24 DIAGNOSIS — R2689 Other abnormalities of gait and mobility: Secondary | ICD-10-CM

## 2020-09-24 DIAGNOSIS — M6281 Muscle weakness (generalized): Secondary | ICD-10-CM

## 2020-09-24 DIAGNOSIS — M25562 Pain in left knee: Secondary | ICD-10-CM | POA: Insufficient documentation

## 2020-09-24 DIAGNOSIS — G8929 Other chronic pain: Secondary | ICD-10-CM

## 2020-09-24 NOTE — Therapy (Signed)
[facility] [address] [place], [place], [address] Phone: [phone]   Fax:  [phone]  Physical Therapy Treatment  Patient Details  Name: [name] MRN: [id number] Date of Birth: [birth year] Referring Provider (PT): Enid Skeens NP   Encounter Date: 09/24/2020   PT End of Session - 09/24/20 1056     Visit Number 10    Number of Visits 16    Date for PT Re-Evaluation 09/28/20    Authorization Type medicare progress note at 10th visit    PT Start Time 0950    PT Stop Time 1035    PT Time Calculation (min) 45 min    Equipment Utilized During Treatment Other (comment)    Activity Tolerance Patient tolerated treatment well;Other (comment)             Past Medical History:  Diagnosis Date   Birth defect    Body mass index (BMI) of 70 or greater in adult [facility]) 07/20/2020   Encounter for routine laboratory testing 07/20/2020    Past Surgical History:  Procedure Laterality Date   KNEE SURGERY      There were no vitals filed for this visit.   Subjective Assessment - 09/24/20 1054     Subjective Father states pt didn't want to use rollator today to come to therapy, presents without it.    Patient is accompained by: Family member    Currently in Pain? Yes    Pain Score 2     Pain Location Knee    Pain Orientation Left    Pain Descriptors / Indicators Aching    Pain Type Chronic pain    Pain Onset More than a month ago             Pt seen for aquatic therapy today.  Treatment took place in water 3.25 ft in depth at the [facility] pool. Temp of water was 90.  Pt entered pool sitting on side then standing on water bench, exited the pool via stairs step to pattern cga-sba with bilat rail.   Warm up: forward, backward and side stepping/walking across pool and along pools edge holding to wall progressing to w/o UE support.  Sitting  3rd step pt completes flutter kicking and abd/add  Encouraged  exercises for strengthening and aerobic capacity challenge Kick board pushdown 2 x 10 reps  Standing Jumping and fast walking/running, forward and backwards, and side stepping multiple widths of pool  hand held assist with cueing for "racing" to keep pt engaged.  Add/abd and hip ext 2 x 10 reps.   Modified prone/superman position Pt attained position modified with improved ability at steps using ankle buoys holding to handrails assisted into position and stabilized by PT for multiple short periods of time, cuing for flutter kicking and pulling feet to floor (abdominals)   Pt requiring constant verbal and tactile cues for time on task.   Pt requires buoyancy for support and to offload joints with strengthening exercises. Viscosity of the water is needed for resistance of strengthening; water current perturbations provides challenge to standing balance unsupported, requiring increased core activation                          PT Education - 09/24/20 1055     Education Details use chairs and benches along the way to the pool to sit and rest.              PT  Short Term Goals - 08/26/20 1021       PT SHORT TERM GOAL #1   Status Achieved      PT SHORT TERM GOAL #3   Status On-going               PT Long Term Goals - 09/22/20 1357       PT LONG TERM GOAL #2   Status Achieved                   Plan - 09/24/20 1100     Clinical Impression Statement Pt presents without rollatro today.  PT has assists pt from front desk to pool.  She requires 1 standing rest period and 1 seated rest period.  She is more distractable some difficulty with time on task.  Able to attaing better positioning for improved strengthening as initiated last visit.    Personal Factors and Comorbidities Behavior Pattern;Comorbidity 1;Comorbidity 2;Past/Current Experience;Fitness;Time since onset of injury/illness/exacerbation    Comorbidities DMII, BMI < 70; Congential  defomity of the left leg; Parder Willi syndrome,    Examination-Activity Limitations Bend;Lift;Locomotion Level;Squat;Stand;Stairs;Sit;Carry;Transfers    Examination-Participation Restrictions Cleaning;Laundry;Shop;Community Activity    Clinical Decision Making High    Rehab Potential Fair    PT Frequency 2x / week    PT Duration 8 weeks    PT Treatment/Interventions ADLs/Self Care Home Management;Aquatic Therapy;Moist Heat;Ultrasound;Stair training;Functional mobility training;Gait training;Therapeutic activities;Therapeutic exercise;Patient/family education;Manual techniques;Passive range of motion;Taping    PT Home Exercise Plan Access Code: [email or web address]  URL: [email or web address]  Date: 08/03/2020  Prepared by: Lorayne Bender    Exercises  Seated Long Arc Quad [phone] Degree Range) - 1 x daily - 7 x weekly - 3 sets - 10 reps  Seated Hamstring Curls with Resistance - 1 x daily - 7 x weekly - 3 sets - 10 reps  Seated Hip Abduction with Resistance - 1 x daily - 7 x weekly - 3 sets - 10 reps  Seated Heel Raise - 1 x daily - 7 x weekly - 3 sets - 10 reps    Consulted and Agree with Plan of Care Family member/caregiver    Family Member Consulted father             Patient will benefit from skilled therapeutic intervention in order to improve the following deficits and impairments:  Abnormal gait, Decreased endurance, Increased muscle spasms, Obesity, Pain, Decreased activity tolerance, Decreased safety awareness, Decreased strength, Difficulty walking  Visit Diagnosis: Chronic pain of left knee  Other abnormalities of gait and mobility  Muscle weakness (generalized)     Problem List Patient Active Problem List   Diagnosis Date Noted   Body mass index (BMI) of 60.0-69.9 in adult (HCC) 08/02/2020   Elevated BP without diagnosis of hypertension 08/02/2020   Elevated cholesterol 08/02/2020   Encounter to establish care 07/20/2020   Pain in joint involving pelvic region and  thigh, bilateral 07/20/2020   Bilateral chronic knee pain 07/20/2020   Open wound of skin 07/20/2020   Self-injurious behavior 11/06/2016   Type 2 diabetes mellitus with hyperglycemia, with long-term current use of insulin (HCC) 11/06/2016   Tibia vara of left lower extremity 08/09/2016   Scoliosis 05/05/2011   Prader-Willi syndrome 05/30/2007    Elon Alas Athene Schuhmacher MPT 09/24/2020, 11:12 AM  [facility] [address] [place], [place], [address] Phone: [phone]   Fax:  [phone]  Name: PARA COSSEY MRN: [id number] Date of Birth: 26-Jan-2001

## 2020-09-30 ENCOUNTER — Other Ambulatory Visit: Payer: Self-pay

## 2020-09-30 ENCOUNTER — Encounter (HOSPITAL_BASED_OUTPATIENT_CLINIC_OR_DEPARTMENT_OTHER): Payer: Self-pay | Admitting: Physical Therapy

## 2020-09-30 ENCOUNTER — Ambulatory Visit (HOSPITAL_BASED_OUTPATIENT_CLINIC_OR_DEPARTMENT_OTHER): Payer: Medicare Other | Admitting: Physical Therapy

## 2020-09-30 DIAGNOSIS — R2689 Other abnormalities of gait and mobility: Secondary | ICD-10-CM

## 2020-09-30 DIAGNOSIS — M25562 Pain in left knee: Secondary | ICD-10-CM | POA: Diagnosis not present

## 2020-09-30 DIAGNOSIS — M6281 Muscle weakness (generalized): Secondary | ICD-10-CM

## 2020-09-30 DIAGNOSIS — G8929 Other chronic pain: Secondary | ICD-10-CM

## 2020-10-01 ENCOUNTER — Encounter (HOSPITAL_BASED_OUTPATIENT_CLINIC_OR_DEPARTMENT_OTHER): Payer: Self-pay | Admitting: Physical Therapy

## 2020-10-01 NOTE — Therapy (Signed)
[facility] [address] [place], [place], [address] Phone: [phone]   Fax:  [phone]  Physical Therapy Treatment/PRogress Note   Patient Details  Name: [name] MRN: [id number] Date of Birth: [birth year] Referring Provider (PT): Enid Skeens NP   Encounter Date: 09/30/2020  Progress Note Reporting Period 08/02/2020 to 09/30/2020  See note below for Objective Data and Assessment of Progress/Goals.       PT End of Session - 09/30/20 1200     Visit Number 10    Number of Visits 22    Date for PT Re-Evaluation 11/25/20    Authorization Type medicare progress note at 10th visit    PT Start Time 1150    PT Stop Time 1230    PT Time Calculation (min) 40 min    Activity Tolerance Patient tolerated treatment well;Other (comment)    Behavior During Therapy WFL for tasks assessed/performed             Past Medical History:  Diagnosis Date   Birth defect    Body mass index (BMI) of 70 or greater in adult [facility]) 07/20/2020   Encounter for routine laboratory testing 07/20/2020    Past Surgical History:  Procedure Laterality Date   KNEE SURGERY      There were no vitals filed for this visit.   Subjective Assessment - 09/30/20 1157     Subjective Patient has been coming to therapy for 6 weeks. Per patients father    Patient is accompained by: Family member    Pertinent History Prater Willie syndrome,    Limitations Lifting;Standing    How long can you stand comfortably? undefined.  Pt able to complete all ADl's and household functional mobility    How long can you walk comfortably? at minimum 10 mins    Diagnostic tests no recent x-rays    Currently in Pain? Yes    Pain Score 2    per visual scale   Pain Location Knee    Pain Orientation Left    Pain Descriptors / Indicators Aching    Pain Type Chronic pain    Pain Onset More than a month ago    Pain Frequency Intermittent    Aggravating Factors  standing  and walking    Pain Relieving Factors weight bearing    Effect of Pain on Daily Activities difficulty with mabualtion                [facility] PT Assessment - 10/01/20 0001       Strength   Right Hip Flexion 3+/5    Right Knee Flexion 3+/5    Right Knee Extension 3+/5      Palpation   Palpation comment no tenderness to palpation noted      Transfers   Comments uses hands to transfer from sit to stand      Ambulation/Gait   Gait Comments Patient contiues to ambualte with extreme verus.                                   PT Education - 09/30/20 1213     Education Details Discuss long term goals with parent/care take. Use of yellow band to perform strengthing exercise and cues for parent/caretaker.    Person(s) Educated Patient    Methods Explanation;Demonstration;Verbal cues;Tactile cues    Comprehension Verbalized understanding;Verbal cues required;Tactile cues required  PT Short Term Goals - 10/01/20 1546       PT SHORT TERM GOAL #1   Title Patient will be indepdnent with base exercise program    Time 3    Period Weeks    Status Achieved    Target Date 08/24/20      PT SHORT TERM GOAL #2   Title Patient will increase gross LE strength by 1 grade    Baseline improved strength on the right. No gain on the left    Time 3    Period Weeks    Status On-going    Target Date 08/24/20      PT SHORT TERM GOAL #3   Title Patient will report a 3 lb weiht loss    Baseline has not lost any weight    Time 3    Period Weeks    Status On-going    Target Date 10/22/20               PT Long Term Goals - 10/01/20 1547       PT LONG TERM GOAL #1   Title Patient will go up/down 8 steps with minimal discomfort in order to get to her room    Baseline has not started stair training    Time 6    Period Weeks    Status On-going      PT LONG TERM GOAL #2   Title Patient will be independent with complete pool and land program  to promote wiehgt loss and increase left LE strength and stability.    Baseline is becoming more independent with program    Time 6    Period Weeks    Status Achieved                   Plan - 09/30/20 1208     Clinical Impression Statement Patient presents with rollater today. Patients father states that she has some days that she requires rolleter and other days where she feels strong. Patient was able to complete thera-ex for UE with verbal and tactical cues. Overall the patients progress has been slow. It was never expected to be fast though per cognative and physical limitations. She has established a pool exercise porgram and is becoming more confortabel with her routine. She has been working on exercising in the pool. At home she is still sneaking food and she hasn't lost weight. We discussed long term plan wth the patients father. The patient has been to 10 visits but missed 7. She has been attending 1x a week on average. Therapy discusssed attending 1x a week. The patients father became mildly agitated with this but therapy explained our attendance policy. We will continue 1W6 to continue to work on strengthening and general conditioning.    Personal Factors and Comorbidities Behavior Pattern;Comorbidity 1;Comorbidity 2;Past/Current Experience;Fitness;Time since onset of injury/illness/exacerbation    Comorbidities DMII, BMI < 70; Congential defomity of the left leg; Parder Willi syndrome,    Examination-Activity Limitations Bend;Lift;Locomotion Level;Squat;Stand;Stairs;Sit;Carry;Transfers    Examination-Participation Restrictions Cleaning;Laundry;Shop;Community Activity    Stability/Clinical Decision Making Evolving/Moderate complexity    Clinical Decision Making High    Rehab Potential Fair    PT Frequency 2x / week    PT Duration 8 weeks    PT Treatment/Interventions ADLs/Self Care Home Management;Aquatic Therapy;Moist Heat;Ultrasound;Stair training;Functional mobility  training;Gait training;Therapeutic activities;Therapeutic exercise;Patient/family education;Manual techniques;Passive range of motion;Taping    PT Next Visit Plan weight pt in locker room    PT Home Exercise  Plan Access Code: [email or web address]  URL: [email or web address]  Date: 08/03/2020  Prepared by: Lorayne Bender    Exercises  Seated Long Arc Quad [phone] Degree Range) - 1 x daily - 7 x weekly - 3 sets - 10 reps  Seated Hamstring Curls with Resistance - 1 x daily - 7 x weekly - 3 sets - 10 reps  Seated Hip Abduction with Resistance - 1 x daily - 7 x weekly - 3 sets - 10 reps  Seated Heel Raise - 1 x daily - 7 x weekly - 3 sets - 10 reps    Consulted and Agree with Plan of Care Family member/caregiver    Family Member Consulted father             Patient will benefit from skilled therapeutic intervention in order to improve the following deficits and impairments:  Abnormal gait, Decreased endurance, Increased muscle spasms, Obesity, Pain, Decreased activity tolerance, Decreased safety awareness, Decreased strength, Difficulty walking  Visit Diagnosis: Chronic pain of left knee  Other abnormalities of gait and mobility  Muscle weakness (generalized)     Problem List Patient Active Problem List   Diagnosis Date Noted   Body mass index (BMI) of 60.0-69.9 in adult (HCC) 08/02/2020   Elevated BP without diagnosis of hypertension 08/02/2020   Elevated cholesterol 08/02/2020   Encounter to establish care 07/20/2020   Pain in joint involving pelvic region and thigh, bilateral 07/20/2020   Bilateral chronic knee pain 07/20/2020   Open wound of skin 07/20/2020   Self-injurious behavior 11/06/2016   Type 2 diabetes mellitus with hyperglycemia, with long-term current use of insulin (HCC) 11/06/2016   Tibia vara of left lower extremity 08/09/2016   Scoliosis 05/05/2011   Prader-Willi syndrome 05/30/2007    Dessie Coma 10/01/2020, 3:49 PM  [facility] [address] [place], [place], [address] Phone: [phone]   Fax:  [phone]  Name: DOLORAS TELLADO MRN: [id number] Date of Birth: 02/08/2001

## 2020-10-07 ENCOUNTER — Other Ambulatory Visit: Payer: Self-pay

## 2020-10-07 ENCOUNTER — Encounter (HOSPITAL_BASED_OUTPATIENT_CLINIC_OR_DEPARTMENT_OTHER): Payer: Self-pay | Admitting: Physical Therapy

## 2020-10-07 ENCOUNTER — Ambulatory Visit (HOSPITAL_BASED_OUTPATIENT_CLINIC_OR_DEPARTMENT_OTHER): Payer: Medicare Other | Admitting: Physical Therapy

## 2020-10-07 DIAGNOSIS — M25562 Pain in left knee: Secondary | ICD-10-CM | POA: Diagnosis not present

## 2020-10-07 DIAGNOSIS — G8929 Other chronic pain: Secondary | ICD-10-CM

## 2020-10-07 DIAGNOSIS — R2689 Other abnormalities of gait and mobility: Secondary | ICD-10-CM

## 2020-10-07 DIAGNOSIS — M6281 Muscle weakness (generalized): Secondary | ICD-10-CM

## 2020-10-07 NOTE — Therapy (Addendum)
[facility] [address] [place], [place], [address] Phone: [phone]   Fax:  [phone]  Physical Therapy Treatment/Discharge   Patient Details  Name: [name] MRN: [id number] Date of Birth: March 12, 2001 Referring Provider (PT): [name] NP   Encounter Date: 10/07/2020   PT End of Session - 10/07/20 1115     Visit Number 11    Number of Visits 22    Date for PT Re-Evaluation 11/25/20    Authorization Type medicare progress note at 10th visit    PT Start Time 1045    PT Stop Time 1115    PT Time Calculation (min) 30 min    Equipment Utilized During Treatment Other (comment)    Activity Tolerance Patient tolerated treatment well;Other (comment)             Past Medical History:  Diagnosis Date   Birth defect    Body mass index (BMI) of 70 or greater in adult [facility]) 07/20/2020   Encounter for routine laboratory testing 07/20/2020    Past Surgical History:  Procedure Laterality Date   KNEE SURGERY      There were no vitals filed for this visit.   Subjective Assessment - 10/07/20 1329     Subjective No complaints.  Pt states she is feeling well.    Patient is accompained by: Family member    Pertinent History Prater Willie syndrome,    Limitations Lifting;Standing    Currently in Pain? Yes    Pain Score 2     Pain Location Knee    Pain Orientation Left    Pain Descriptors / Indicators Aching    Pain Type Chronic pain    Pain Frequency Intermittent    Aggravating Factors  standing and walking    Pain Relieving Factors rest    Effect of Pain on Daily Activities difficulty with ambulation           TREATMENT Pt seen for aquatic therapy today.  Treatment took place in water 3.25-4.8 ft in depth at the [facility] pool. Temp of water was 90.  Pt entered/exited the pool via stairs step to pattern cga-sba with bilat rail. Warm up: forward, backward and side stepping/walking across pool  and along pools edge holding to wall progressing to w/o UE support. Encouraged exercises for strengthening and aerobic capacity challenges: sitting on bench as well as standing , marching, flutter and scissor kicking. Jumping and fast walking/running, forward and backward width of pool x 8 reps with rest periods between. Added 2 lb ankle weights for resistance and to assist with time on task. Step ups on bottom step x 10 reps UE: using hand buoy; horizontal abd/add, shoulder flex and abd 2 x 10.  Barbell push down x 10 reps. Pt given constant VC for time on task  Pt requires buoyancy for support and to offload joints with strengthening exercises. Viscosity of the water is needed for resistance of strengthening; water current perturbations provides challenge to standing balance unsupported, requiring increased core activation                              PT Short Term Goals - 10/01/20 1546       PT SHORT TERM GOAL #1   Title Patient will be indepdnent with base exercise program    Time 3    Period Weeks    Status Achieved    Target Date 08/24/20  PT SHORT TERM GOAL #2   Title Patient will increase gross LE strength by 1 grade    Baseline improved strength on the right. No gain on the left    Time 3    Period Weeks    Status On-going    Target Date 08/24/20      PT SHORT TERM GOAL #3   Title Patient will report a 3 lb weiht loss    Baseline has not lost any weight    Time 3    Period Weeks    Status On-going    Target Date 10/22/20               PT Long Term Goals - 10/01/20 1547       PT LONG TERM GOAL #1   Title Patient will go up/down 8 steps with minimal discomfort in order to get to her room    Baseline has not started stair training    Time 6    Period Weeks    Status On-going      PT LONG TERM GOAL #2   Title Patient will be independent with complete pool and land program to promote wiehgt loss and increase left LE strength and  stability.    Baseline is becoming more independent with program    Time 6    Period Weeks    Status Achieved                   Plan - 10/07/20 1331     Clinical Impression Statement Father has better understanding of decreased visits to one time weekly. Pt is coming to pool exercising on her own up to 2 x weekly.  She, with constant verbal and tactile cues engages in continuous activity, guided by play with focus on conditioning    Personal Factors and Comorbidities Behavior Pattern;Comorbidity 1;Comorbidity 2;Past/Current Experience;Fitness;Time since onset of injury/illness/exacerbation    PT Treatment/Interventions ADLs/Self Care Home Management;Aquatic Therapy;Moist Heat;Ultrasound;Stair training;Functional mobility training;Gait training;Therapeutic activities;Therapeutic exercise;Patient/family education;Manual techniques;Passive range of motion;Taping    PT Home Exercise Plan Access Code: [email or web address]  URL: [email or web address]  Date: 08/03/2020  Prepared by: Carolyne Littles    Exercises  Seated Long Arc Quad [phone] Degree Range) - 1 x daily - 7 x weekly - 3 sets - 10 reps  Seated Hamstring Curls with Resistance - 1 x daily - 7 x weekly - 3 sets - 10 reps  Seated Hip Abduction with Resistance - 1 x daily - 7 x weekly - 3 sets - 10 reps  Seated Heel Raise - 1 x daily - 7 x weekly - 3 sets - 10 reps    Consulted and Agree with Plan of Care Family member/caregiver    Family Member Consulted father             Patient will benefit from skilled therapeutic intervention in order to improve the following deficits and impairments:  Abnormal gait, Decreased endurance, Increased muscle spasms, Obesity, Pain, Decreased activity tolerance, Decreased safety awareness, Decreased strength, Difficulty walking  Visit Diagnosis: Chronic pain of left knee  Other abnormalities of gait and mobility  Muscle weakness (generalized)  PHYSICAL THERAPY DISCHARGE SUMMARY  Visits from  Start of Care: 11  Current functional level related to goals / functional outcomes: No significant improvement but did have a full exercise program    Remaining deficits:    Education / Equipment:   Patient agrees to discharge. Patient goals were not met. Patient is being discharged due  to not returning since the last visit.    Problem List Patient Active Problem List   Diagnosis Date Noted   Body mass index (BMI) of 60.0-69.9 in adult ([place]) 08/02/2020   Elevated BP without diagnosis of hypertension 08/02/2020   Elevated cholesterol 08/02/2020   Encounter to establish care 07/20/2020   Pain in joint involving pelvic region and thigh, bilateral 07/20/2020   Bilateral chronic knee pain 07/20/2020   Open wound of skin 07/20/2020   Self-injurious behavior 11/06/2016   Type 2 diabetes mellitus with hyperglycemia, with long-term current use of insulin ([place]) 11/06/2016   Tibia vara of left lower extremity 08/09/2016   Scoliosis 05/05/2011   Prader-Willi syndrome 05/30/2007    Macky [place] Mazzeo MPT 10/07/2020, 1:42 PM  Discharge done by Carolyne Littles PT DPT 01/03/2021   [facility] [address] [place], [place], [address] Phone: [phone]   Fax:  [phone]  Name: [name] MRN: [id number] Date of Birth: October 17, 2000

## 2020-10-14 ENCOUNTER — Ambulatory Visit (HOSPITAL_BASED_OUTPATIENT_CLINIC_OR_DEPARTMENT_OTHER): Payer: Medicare Other | Admitting: Physical Therapy

## 2020-10-21 ENCOUNTER — Ambulatory Visit (HOSPITAL_BASED_OUTPATIENT_CLINIC_OR_DEPARTMENT_OTHER): Payer: Medicare Other | Admitting: Physical Therapy

## 2020-10-28 ENCOUNTER — Ambulatory Visit (HOSPITAL_BASED_OUTPATIENT_CLINIC_OR_DEPARTMENT_OTHER): Payer: Medicare Other | Admitting: Physical Therapy

## 2020-11-03 ENCOUNTER — Ambulatory Visit (HOSPITAL_BASED_OUTPATIENT_CLINIC_OR_DEPARTMENT_OTHER): Payer: Medicare Other | Attending: Nurse Practitioner | Admitting: Physical Therapy

## 2020-11-03 DIAGNOSIS — M25562 Pain in left knee: Secondary | ICD-10-CM | POA: Insufficient documentation

## 2020-11-03 DIAGNOSIS — G8929 Other chronic pain: Secondary | ICD-10-CM | POA: Insufficient documentation

## 2020-11-03 DIAGNOSIS — M6281 Muscle weakness (generalized): Secondary | ICD-10-CM | POA: Insufficient documentation

## 2020-11-03 DIAGNOSIS — R2689 Other abnormalities of gait and mobility: Secondary | ICD-10-CM | POA: Insufficient documentation

## 2020-11-10 ENCOUNTER — Ambulatory Visit (HOSPITAL_BASED_OUTPATIENT_CLINIC_OR_DEPARTMENT_OTHER): Payer: Medicare Other | Admitting: Physical Therapy

## 2020-11-18 ENCOUNTER — Ambulatory Visit (HOSPITAL_BASED_OUTPATIENT_CLINIC_OR_DEPARTMENT_OTHER): Payer: Medicare Other | Admitting: Physical Therapy

## 2020-12-22 ENCOUNTER — Encounter (HOSPITAL_BASED_OUTPATIENT_CLINIC_OR_DEPARTMENT_OTHER): Payer: Self-pay | Admitting: Nurse Practitioner

## 2021-03-16 ENCOUNTER — Other Ambulatory Visit (HOSPITAL_BASED_OUTPATIENT_CLINIC_OR_DEPARTMENT_OTHER): Payer: Self-pay | Admitting: Nurse Practitioner

## 2021-03-16 ENCOUNTER — Telehealth (HOSPITAL_BASED_OUTPATIENT_CLINIC_OR_DEPARTMENT_OTHER): Payer: Self-pay | Admitting: Nurse Practitioner

## 2021-03-16 DIAGNOSIS — Z794 Long term (current) use of insulin: Secondary | ICD-10-CM

## 2021-03-16 DIAGNOSIS — Z6841 Body Mass Index (BMI) 40.0 and over, adult: Secondary | ICD-10-CM

## 2021-03-16 DIAGNOSIS — E7800 Pure hypercholesterolemia, unspecified: Secondary | ICD-10-CM

## 2021-03-16 MED ORDER — DULAGLUTIDE 1.5 MG/0.5ML ~~LOC~~ SOAJ: 1.5000 mg | SUBCUTANEOUS | 3 refills | Status: DC

## 2021-03-16 NOTE — Telephone Encounter (Signed)
Tried calling both numbers but no VM set up on one and the other the VM is full. Will try again at another time.

## 2021-03-17 ENCOUNTER — Other Ambulatory Visit (HOSPITAL_BASED_OUTPATIENT_CLINIC_OR_DEPARTMENT_OTHER): Payer: Self-pay | Admitting: Nurse Practitioner

## 2021-03-17 DIAGNOSIS — T148XXA Other injury of unspecified body region, initial encounter: Secondary | ICD-10-CM

## 2021-03-29 DIAGNOSIS — Z7984 Long term (current) use of oral hypoglycemic drugs: Secondary | ICD-10-CM | POA: Diagnosis not present

## 2021-03-29 DIAGNOSIS — E1165 Type 2 diabetes mellitus with hyperglycemia: Secondary | ICD-10-CM | POA: Diagnosis not present

## 2021-03-29 DIAGNOSIS — Z794 Long term (current) use of insulin: Secondary | ICD-10-CM | POA: Diagnosis not present

## 2021-04-13 ENCOUNTER — Ambulatory Visit (INDEPENDENT_AMBULATORY_CARE_PROVIDER_SITE_OTHER): Payer: Medicare Other | Admitting: Nurse Practitioner

## 2021-04-13 ENCOUNTER — Encounter (HOSPITAL_BASED_OUTPATIENT_CLINIC_OR_DEPARTMENT_OTHER): Payer: Self-pay | Admitting: Nurse Practitioner

## 2021-04-13 ENCOUNTER — Other Ambulatory Visit: Payer: Self-pay

## 2021-04-13 VITALS — BP 130/82 | Ht 60.0 in | Wt 368.7 lb

## 2021-04-13 DIAGNOSIS — F424 Excoriation (skin-picking) disorder: Secondary | ICD-10-CM | POA: Diagnosis not present

## 2021-04-13 DIAGNOSIS — M25552 Pain in left hip: Secondary | ICD-10-CM

## 2021-04-13 DIAGNOSIS — M25561 Pain in right knee: Secondary | ICD-10-CM

## 2021-04-13 DIAGNOSIS — Z794 Long term (current) use of insulin: Secondary | ICD-10-CM

## 2021-04-13 DIAGNOSIS — M4100 Infantile idiopathic scoliosis, site unspecified: Secondary | ICD-10-CM | POA: Diagnosis not present

## 2021-04-13 DIAGNOSIS — G8929 Other chronic pain: Secondary | ICD-10-CM | POA: Diagnosis not present

## 2021-04-13 DIAGNOSIS — M25562 Pain in left knee: Secondary | ICD-10-CM | POA: Diagnosis not present

## 2021-04-13 DIAGNOSIS — M25551 Pain in right hip: Secondary | ICD-10-CM

## 2021-04-13 DIAGNOSIS — Q8711 Prader-Willi syndrome: Secondary | ICD-10-CM

## 2021-04-13 DIAGNOSIS — E1165 Type 2 diabetes mellitus with hyperglycemia: Secondary | ICD-10-CM | POA: Diagnosis not present

## 2021-04-13 DIAGNOSIS — Z6841 Body Mass Index (BMI) 40.0 and over, adult: Secondary | ICD-10-CM

## 2021-04-13 MED ORDER — LANTUS SOLOSTAR 100 UNIT/ML ~~LOC~~ SOPN: 30.0000 [IU] | PEN_INJECTOR | Freq: Every day | SUBCUTANEOUS | 6 refills | Status: DC

## 2021-04-13 MED ORDER — SERTRALINE HCL 50 MG PO TABS: 50.0000 mg | ORAL_TABLET | Freq: Every day | ORAL | 3 refills | Status: DC

## 2021-04-13 MED ORDER — AMBULATORY NON FORMULARY MEDICATION: 0 refills | Status: AC

## 2021-04-13 MED ORDER — TRULICITY 3 MG/0.5ML ~~LOC~~ SOAJ: 3.0000 mg | SUBCUTANEOUS | 11 refills | Status: DC

## 2021-04-13 NOTE — Progress Notes (Signed)
Established Patient Office Visit  Subjective:  Patient ID: [name], female    DOB: 2001/01/01  Age: 21 y.o. MRN: [id number]  CC:  Chief Complaint  Patient presents with   Diabetes    HPI [name] presents for follow-up for diabetes and Prader [name]. [name] is with her Dad today.  Eye exam 2022 at [facility], need to request records. No vision concerns at this time. [name] does not like to wear her glasses.  [name] is currently walking with a rollator walker, but it is having some mechanical issues an needs replaced. They would like a prescription so this can be covered by insurance.  Dad tells me that they have had some issues with continuing pool therapy [facility]. Due to [name]'s skin picking and concerns with infection, she did miss some appointments and subsequently her appointments were reduced to once a week. Dad tells me that this upset him as it was in the best interest and safety of [name] and he ultimately chose to cancel his membership and look elsewhere. They have recently joined [employer] and are looking into upgraded options and hoping to have this covered by insurance. He will need something stating that the upgrades are medically beneficial, but he is not sure what he will need at this time.   Diabetes Blood sugar this morning 158 before meals.  Typically 102-168 fasting in the morning. In the evenings between 170-180. Last saw endocrinology [name] this month and A1c was 6.9%. Dad reports that they have stopped lantus as he was not sure what dosage to give. He wanted to stop the medication that was giving her diarrhea, but reports that he mistakenly thought this was lantus and not the metformin. He reports stopping this abut 2 months ago.   She is still taking the metformin, but has significant GI bloating and diarrhea. He would like to stop this if we can.  He does have novolog available to use if her blood sugars are >250. He has not had to  use this recently.   He endorses concerns today with [name] skin picking behaviors. He reports they seem to be worse when she is hungry or anxious.  He tells me that she will continually pick at the same place causing large difficulty heel sores.  He tells me that he is concerned about her developing infection and her ability to combat this with the diabetes.  She has never been admitted for close capacity is wondering if there are medication options that can be helpful.   Past Medical History:  Diagnosis Date   Birth defect    Body mass index (BMI) of 70 or greater in adult [facility](HCC) 07/20/2020   Diabetes due to underlying condition w oth skin comp (HCC)    DM (diabetes mellitus), type 2, uncontrolled with complications    Encounter for routine laboratory testing 07/20/2020   Prader-Willi syndrome    Prader-Willi syndrome and diabetes (HCC)    Scoliosis    Tibia vara of left lower extremity     Past Surgical History:  Procedure Laterality Date   KNEE SURGERY     MYRINGOTOMY Bilateral     Family History  Problem Relation Age of Onset   Hypertension Father    Heart disease Maternal Grandmother    Cancer Paternal Grandfather     Social History   Socioeconomic History   Marital status: Single    Spouse name: Not on file   Number of children: Not  on file   Years of education: Not on file   Highest education level: Not on file  Occupational History   Not on file  Tobacco Use   Smoking status: Never   Smokeless tobacco: Never  Vaping Use   Vaping Use: Never used  Substance and Sexual Activity   Alcohol use: No   Drug use: Never   Sexual activity: Never  Other Topics Concern   Not on file  Social History Narrative   Not on file   Social Determinants of Health   Financial Resource Strain: Not on file  Food Insecurity: No Food Insecurity   Worried About Running Out of Food in the Last Year: Never true   Ran Out of Food in the Last Year: Never true  Transportation  Needs: Not on file  Physical Activity: Sufficiently Active   Days of Exercise per Week: 3 days   Minutes of Exercise per Session: 60 min  Stress: No Stress Concern Present   Feeling of Stress : Not at all  Social Connections: Moderately Isolated   Frequency of Communication with Friends and Family: More than three times a week   Frequency of Social Gatherings with Friends and Family: More than three times a week   Attends Religious Services: Never   Database administratorActive Member of Clubs or Organizations: Yes   Attends Engineer, structuralClub or Organization Meetings: More than 4 times per year   Marital Status: Never married  Catering managerntimate Partner Violence: Not At Risk   Fear of Current or Ex-Partner: No   Emotionally Abused: No   Physically Abused: No   Sexually Abused: No    Outpatient Medications Prior to Visit  Medication Sig Dispense Refill   Accu-Chek FastClix Lancets MISC SMARTSIG:Topical 6 Times Daily     ACCU-CHEK GUIDE test strip USE TO CHECK BLOOD SUGAR UP TO 6 TIMES DAILY AS DIRECTED     ammonium lactate (LAC-HYDRIN) 12 % lotion Apply topically.     B-D UF III MINI PEN NEEDLES 31G X 5 MM MISC SMARTSIG:Injection     mupirocin ointment (BACTROBAN) 2 % APPLY TOPICALLY TO THE AFFECTED AREA 2 TO 3 TIMES DAILY AS DIRECTED 30 g 5   FLUARIX QUADRIVALENT 0.5 ML injection  (Patient not taking: Reported on 04/13/2021)     insulin aspart (NOVOLOG) 100 UNIT/ML FlexPen Inject up to 40 units daily as directed. (Patient not taking: Reported on 04/13/2021)     Misc. Devices MISC Electric heavy duty 48 inch wide hospital bed.   Dx:  Jeanella CaraPrader Willi Syndrome, Diabetes, obesity, increased risk for bed sores/skin break down (Patient not taking: Reported on 04/13/2021)     PFIZER COVID-19 VAC BIVALENT injection      Blood Glucose Monitoring Suppl (GLUCOCOM BLOOD GLUCOSE MONITOR) DEVI Use to check Bg up to 6 times daily as directed. (Patient not taking: Reported on 04/13/2021)     Dulaglutide 1.5 MG/0.5ML SOPN Inject 1.5 mg into the skin  once a week. 4.5 mL 3   insulin glargine (LANTUS SOLOSTAR) 100 UNIT/ML Solostar Pen INJECT 50 UNITS DAILY AS DIRECTED. (Patient not taking: Reported on 04/13/2021)     metFORMIN (GLUCOPHAGE) 500 MG tablet Take 1 tablet (500 mg total) by mouth 2 (two) times daily with a meal. 60 tablet 5   No facility-administered medications prior to visit.    Allergies  Allergen Reactions   Latex Rash    ROS Review of Systems All review of systems negative except what is listed in the HPI    Objective:  Physical Exam Vitals and nursing note reviewed.  Constitutional:      Appearance: Normal appearance. She is obese.  HENT:     Head: Normocephalic.  Eyes:     Extraocular Movements: Extraocular movements intact.     Conjunctiva/sclera: Conjunctivae normal.     Pupils: Pupils are equal, round, and reactive to light.  Neck:     Vascular: No carotid bruit.  Cardiovascular:     Rate and Rhythm: Normal rate and regular rhythm.     Pulses: Normal pulses.     Heart sounds: Normal heart sounds.  Pulmonary:     Effort: Pulmonary effort is normal.     Breath sounds: Normal breath sounds. No wheezing.  Abdominal:     General: There is distension.     Tenderness: There is no abdominal tenderness. There is no guarding or rebound.  Musculoskeletal:     Cervical back: Normal range of motion.     Right lower leg: Edema present.     Left lower leg: Edema present.  Skin:    General: Skin is warm and dry.     Capillary Refill: Capillary refill takes less than 2 seconds.  Neurological:     Mental Status: She is alert. Mental status is at baseline.     Coordination: Coordination abnormal.     Gait: Gait abnormal.    BP 130/82    Ht 5' (1.524 m)    Wt (!) 368 lb 11.2 oz (167.2 kg)    LMP  (LMP Unknown)    BMI 72.01 kg/m  Wt Readings from Last 3 Encounters:  04/13/21 (!) 368 lb 11.2 oz (167.2 kg)  08/02/20 (!) 335 lb (152 kg)  07/20/20 (!) 359 lb (162.8 kg)     Health Maintenance Due  Topic  Date Due   Pneumococcal Vaccine 70-39 Years old (1 - PCV) 01/19/2006   OPHTHALMOLOGY EXAM  Never done   URINE MICROALBUMIN  Never done   HIV Screening  Never done   Hepatitis C Screening  Never done   COVID-19 Vaccine (3 - Booster for Janssen series) 04/28/2020   INFLUENZA VACCINE  10/25/2020   PAP-Cervical Cytology Screening  Never done   PAP SMEAR-Modifier  Never done   HEMOGLOBIN A1C  01/19/2021    There are no preventive care reminders to display for this patient.  Lab Results  Component Value Date   TSH 2.460 07/20/2020   Lab Results  Component Value Date   WBC 8.5 07/20/2020   HGB 13.5 07/20/2020   HCT 42.9 07/20/2020   MCV 86.8 07/20/2020   PLT 359 07/20/2020   Lab Results  Component Value Date   NA 140 07/20/2020   K 4.3 07/20/2020   CO2 27 07/20/2020   GLUCOSE 123 (H) 07/20/2020   BUN 15 07/20/2020   CREATININE 0.61 07/20/2020   BILITOT 0.5 07/20/2020   ALKPHOS 53 07/20/2020   AST 47 (H) 07/20/2020   ALT 84 (H) 07/20/2020   PROT 7.4 07/20/2020   ALBUMIN 4.5 07/20/2020   CALCIUM 10.0 07/20/2020   ANIONGAP 11 07/20/2020   Lab Results  Component Value Date   CHOL 269 (H) 07/20/2020   Lab Results  Component Value Date   HDL 44 07/20/2020   Lab Results  Component Value Date   LDLCALC 202 (H) 07/20/2020   Lab Results  Component Value Date   TRIG 116 07/20/2020   Lab Results  Component Value Date   CHOLHDL 6.1 07/20/2020   Lab Results  Component  Value Date   HGBA1C 8.3 (H) 07/20/2020      Assessment & Plan:   Problem List Items Addressed This Visit     Prader-Willi syndrome    Continued supervision from family.  Recommend offering healthy options for snacking and in between meals with low calorie foods such as celery.  Recommend increased water intake and avoidance of soda and juice (even "diet" versions). Increase physical activity with exercise and play.       Relevant Medications   Dulaglutide (TRULICITY) 3 MG/0.5ML SOPN    AMBULATORY NON FORMULARY MEDICATION   sertraline (ZOLOFT) 50 MG tablet   insulin glargine (LANTUS SOLOSTAR) 100 UNIT/ML Solostar Pen   Scoliosis    Severe scoliosis in the setting of Prader-Willi syndrome with significant difficulty walking independently. Prescription provided today for Rollator walker with seat to aid in ambulation.       Relevant Medications   AMBULATORY NON FORMULARY MEDICATION   Type 2 diabetes mellitus with hyperglycemia, with long-term current use of insulin (HCC)    Last A1c on 1/3 was 6.9% per lab results. The office note shows POC was down to 5.7%, which is conflicting. Both show good glycemic control on regimen.  At the request of father, will stop metformin to improve GI disturbance and abdominal bloating. Recommend restart Lantus at 30 units once a day. Continue to monitor for use of NovoLog with meals.  Dose this at 1 unit for blood sugars at 250, 2 units for blood sugars at 300, 3 units for blood sugars at 350, 4 units for blood sugars at 400. Will increase Trulicity to 3 mg once a week. We will plan to recheck labs in 3 months and see how changes have affected blood sugar control.      Relevant Medications   Dulaglutide (TRULICITY) 3 MG/0.5ML SOPN   AMBULATORY NON FORMULARY MEDICATION   insulin glargine (LANTUS SOLOSTAR) 100 UNIT/ML Solostar Pen   BMI 70 and over, adult (HCC)    BMI 72.01 today. Strongly recommend increased physical activity. Dad will let me know what kind of documentation is needed for insurance to cover the [employer]. Continue to limit caloric intake and monitor for overeating. We will plan to follow-up in 3 months.      Bilateral chronic knee pain   Relevant Medications   AMBULATORY NON FORMULARY MEDICATION   sertraline (ZOLOFT) 50 MG tablet   Other Visit Diagnoses     Compulsive skin picking    -  Primary   Relevant Medications   sertraline (ZOLOFT) 50 MG tablet   Hip pain, bilateral       Relevant  Medications   AMBULATORY NON FORMULARY MEDICATION       Meds ordered this encounter  Medications   Dulaglutide (TRULICITY) 3 MG/0.5ML SOPN    Sig: Inject 3 mg as directed once a week.    Dispense:  0.5 mL    Refill:  11   AMBULATORY NON FORMULARY MEDICATION    Sig: Rolling walker with seat and hand breaks for ambulatory assistance based on insurance coverage and patient fit.    Dispense:  1 Units    Refill:  0   sertraline (ZOLOFT) 50 MG tablet    Sig: Take 1 tablet (50 mg total) by mouth at bedtime.    Dispense:  30 tablet    Refill:  3   insulin glargine (LANTUS SOLOSTAR) 100 UNIT/ML Solostar Pen    Sig: Inject 30 Units into the skin daily.  Dispense:  15 mL    Refill:  6    Follow-up: Return in about 3 months (around 07/12/2021) for Diabetes- Prader Rebekah Drape.    Tollie Eth, NP

## 2021-04-13 NOTE — Assessment & Plan Note (Signed)
Severe scoliosis in the setting of Prader-Willi syndrome with significant difficulty walking independently. Prescription provided today for Rollator walker with seat to aid in ambulation.

## 2021-04-13 NOTE — Patient Instructions (Signed)
I have started sertraline 50mg  a day to see if this helps with the picking and anxiety behaviors.   I want you to restart the Lantus at 30 units once a day.  I have increased the Trulicity to 3mg  once a week  I want you to stop the Metformin.  We will follow-up in 3 months and plan to get labs at the next visit.

## 2021-04-13 NOTE — Assessment & Plan Note (Signed)
Continued supervision from family.  Recommend offering healthy options for snacking and in between meals with low calorie foods such as celery.  Recommend increased water intake and avoidance of soda and juice (even "diet" versions). Increase physical activity with exercise and play.

## 2021-04-13 NOTE — Assessment & Plan Note (Signed)
Last A1c on 1/3 was 6.9% per lab results. The office note shows POC was down to 5.7%, which is conflicting. Both show good glycemic control on regimen.  At the request of father, will stop metformin to improve GI disturbance and abdominal bloating. Recommend restart Lantus at 30 units once a day. Continue to monitor for use of NovoLog with meals.  Dose this at 1 unit for blood sugars at 250, 2 units for blood sugars at 300, 3 units for blood sugars at 350, 4 units for blood sugars at 400. Will increase Trulicity to 3 mg once a week. We will plan to recheck labs in 3 months and see how changes have affected blood sugar control.

## 2021-04-13 NOTE — Assessment & Plan Note (Signed)
BMI 72.01 today. Strongly recommend increased physical activity. Dad will let me know what kind of documentation is needed for insurance to cover the [employer]. Continue to limit caloric intake and monitor for overeating. We will plan to follow-up in 3 months.

## 2021-04-20 ENCOUNTER — Encounter (HOSPITAL_BASED_OUTPATIENT_CLINIC_OR_DEPARTMENT_OTHER): Payer: Self-pay | Admitting: Nurse Practitioner

## 2021-04-29 DIAGNOSIS — R531 Weakness: Secondary | ICD-10-CM | POA: Diagnosis not present

## 2021-04-30 ENCOUNTER — Encounter (HOSPITAL_BASED_OUTPATIENT_CLINIC_OR_DEPARTMENT_OTHER): Payer: Self-pay | Admitting: Nurse Practitioner

## 2021-05-09 ENCOUNTER — Encounter (HOSPITAL_BASED_OUTPATIENT_CLINIC_OR_DEPARTMENT_OTHER): Payer: Self-pay | Admitting: Nurse Practitioner

## 2021-05-10 ENCOUNTER — Other Ambulatory Visit (HOSPITAL_BASED_OUTPATIENT_CLINIC_OR_DEPARTMENT_OTHER): Payer: Self-pay | Admitting: Nurse Practitioner

## 2021-05-10 DIAGNOSIS — Z6841 Body Mass Index (BMI) 40.0 and over, adult: Secondary | ICD-10-CM

## 2021-05-10 DIAGNOSIS — Q8711 Prader-Willi syndrome: Secondary | ICD-10-CM

## 2021-05-10 DIAGNOSIS — Z794 Long term (current) use of insulin: Secondary | ICD-10-CM

## 2021-05-10 DIAGNOSIS — E1165 Type 2 diabetes mellitus with hyperglycemia: Secondary | ICD-10-CM

## 2021-05-10 MED ORDER — TRULICITY 1.5 MG/0.5ML ~~LOC~~ SOAJ: SUBCUTANEOUS | 1 refills | Status: DC

## 2021-05-11 ENCOUNTER — Encounter (HOSPITAL_BASED_OUTPATIENT_CLINIC_OR_DEPARTMENT_OTHER): Payer: Self-pay | Admitting: Nurse Practitioner

## 2021-06-06 ENCOUNTER — Encounter (HOSPITAL_BASED_OUTPATIENT_CLINIC_OR_DEPARTMENT_OTHER): Payer: Self-pay | Admitting: Nurse Practitioner

## 2021-06-22 ENCOUNTER — Telehealth (HOSPITAL_BASED_OUTPATIENT_CLINIC_OR_DEPARTMENT_OTHER): Payer: Self-pay | Admitting: Nurse Practitioner

## 2021-06-22 NOTE — Telephone Encounter (Signed)
Received fax transmission on 3/29 from [employer]). Pt was denied authorization for TRULICITY Injection [id number]. Letter will be in provider's yellow dot tray. ? ? ?

## 2021-07-02 ENCOUNTER — Encounter (HOSPITAL_BASED_OUTPATIENT_CLINIC_OR_DEPARTMENT_OTHER): Payer: Self-pay | Admitting: Nurse Practitioner

## 2021-07-04 ENCOUNTER — Other Ambulatory Visit (HOSPITAL_BASED_OUTPATIENT_CLINIC_OR_DEPARTMENT_OTHER): Payer: Self-pay

## 2021-07-12 ENCOUNTER — Ambulatory Visit (INDEPENDENT_AMBULATORY_CARE_PROVIDER_SITE_OTHER): Payer: Medicare Other | Admitting: Nurse Practitioner

## 2021-07-12 ENCOUNTER — Encounter (HOSPITAL_BASED_OUTPATIENT_CLINIC_OR_DEPARTMENT_OTHER): Payer: Self-pay | Admitting: Nurse Practitioner

## 2021-07-12 ENCOUNTER — Other Ambulatory Visit (HOSPITAL_BASED_OUTPATIENT_CLINIC_OR_DEPARTMENT_OTHER): Payer: Self-pay

## 2021-07-12 VITALS — BP 117/72 | HR 86 | Temp 98.7°F | Ht 60.0 in | Wt 361.2 lb

## 2021-07-12 DIAGNOSIS — Q8711 Prader-Willi syndrome: Secondary | ICD-10-CM

## 2021-07-12 DIAGNOSIS — F424 Excoriation (skin-picking) disorder: Secondary | ICD-10-CM | POA: Diagnosis not present

## 2021-07-12 DIAGNOSIS — Z6841 Body Mass Index (BMI) 40.0 and over, adult: Secondary | ICD-10-CM

## 2021-07-12 DIAGNOSIS — Z794 Long term (current) use of insulin: Secondary | ICD-10-CM

## 2021-07-12 DIAGNOSIS — E1165 Type 2 diabetes mellitus with hyperglycemia: Secondary | ICD-10-CM | POA: Diagnosis not present

## 2021-07-12 MED ORDER — TIRZEPATIDE 5 MG/0.5ML ~~LOC~~ SOAJ
5.0000 mg | SUBCUTANEOUS | 1 refills | Status: DC
  Filled 2021-07-12: qty 6, 84d supply, fill #0

## 2021-07-12 MED ORDER — SERTRALINE HCL 100 MG PO TABS
ORAL_TABLET | ORAL | 6 refills | Status: DC
  Filled 2021-07-12: qty 45, 30d supply, fill #0
  Filled 2021-08-14: qty 45, 30d supply, fill #1

## 2021-07-12 NOTE — Patient Instructions (Addendum)
It was a pleasure seeing you today. I hope your time spent with [place] was pleasant and helpful. Please let us know if there is anything we can do to improve the service you receive.  ? ?Today we discussed concerns with: ?Type 2 Diabetes ?Prader Johnnette Gourd Syndrome ?Compulsive Picking  ?BMI ? ?Sertraline- increase to 100mg  once a day for 2 weeks then increase to 150mg  once a day at bedtime.  ?Stop Trulicity and start the [facility]- her sugars may increase a little as we transition, but it should come back down as she progresses.  ?Work to increase walking with [name] every day to get her activity up.  ? ?Important Office Information ?Lab Results ?If labs were ordered, please note that you will see results through MyChart as soon as they come available from [facility].  ?It takes up to 5 business days for the results to be routed to me and for me to review them once all of the lab results have come through from [facility]. I will make recommendations based on your results and send these through MyChart or someone from the office will call you to discuss. If your labs are abnormal, we may contact you to schedule a visit to discuss the results and make recommendations.  ?If you have not heard from [facility] within 5 business days or you have waited longer than a week and your lab results have not come through on MyChart, please feel free to call the office or send a message through MyChart to follow-up on these labs.  ? ?Referrals ?If referrals were placed today, the office where the referral was sent will contact you either by phone or through MyChart to set up scheduling. Please note that it can take up to a week for the referral office to contact you. If you do not hear from them in a week, please contact the referral office directly to inquire about scheduling.  ? ?Condition Treated ?If your condition worsens or you begin to have new symptoms, please schedule a follow-up appointment for further evaluation. If you are not sure if an  appointment is needed, you may call the office to leave a message for the nurse and someone will contact you with recommendations.  ?If you have an urgent or life threatening emergency, please do not call the office, but seek emergency evaluation by calling 911 or going to the nearest emergency room for evaluation.  ? ?MyChart and Phone Calls ?Please do not use MyChart for urgent messages. It may take up to 3 business days for MyChart messages to be read by staff and if they are unable to handle the request, an additional 3 business days for them to be routed to me and for my response.  ?Messages sent to the provider through MyChart do not come directly to the provider, please allow time for these messages to be routed and for me to respond.  ?We get a large volume of MyChart messages daily and these are responded to in the order received.  ? ?For urgent messages, please call the office at [phone] and speak with the front office staff or leave a message on the line of my assistant for guidance.  ?We are seeing patients from the hours of 8:00 am through 5:00 pm and calls directly to the nurse may not be answered immediately due to seeing patients, but your call will be returned as soon as possible.  ?Phone  messages received after 4:00 PM Monday through Thursday may not be returned  until the following business day. Phone messages received after 11:00 AM on Friday may not be returned until Monday.  ? ?After Hours ?We share on call hours with providers from other offices. If you have an urgent need after hours that cannot wait until the next business day, please contact the on call provider by calling the office number. A nurse will speak with you and contact the provider if needed for recommendations.  ?If you have an urgent or life threatening emergency after hours, please do not call the on call provider, but seek emergency evaluation by calling 911 or going to the nearest emergency room for evaluation.   ? ?Paperwork ?All paperwork requires a minimum of 5 days to complete and return to you or the designated personnel. Please keep this in mind when bringing in forms or sending requests for paperwork completion to the office.  ?  ?

## 2021-07-12 NOTE — Progress Notes (Signed)
? ?Established Patient Office Visit ? ?Subjective   ?Patient ID: [name], female    DOB: [birth year]  Age: 21 y.o. MRN: [id number] ? ?No chief complaint on file. ? ? ?HPI ?Picking ?Still picking at skin ?Started at age 61 or 64 ?Dad is concerned with infection and possible limb loss ?She is picking the same areas repeatedly ?Habitual and compulsive in nature ?Worse when anxious or mad this worsens.  ? ?[place] ?Dad reports that it is difficult for him and family not to give into her  ?Eating habits are still consistent with her diagnosis  ?She is moderate on the diagnosis ?She is able to hold a conversation, she can express herself and feelings, etc ?Her weight is still an issue ?She is out of breath with walking and climbing steps ?Dad is trying working to teach her to do things like grocery shopping  ?She has difficulties with ambulation without her walking or assistance.  ?She is not getting any routine PE ? ? ?ROS ?All review of systems negative except what is listed in the HPI ? ?  ?Objective:  ?  ? ?BP 117/72   Pulse 86   Temp 98.7 ?F (37.1 ?C)   Ht 5' (1.524 m)   Wt (!) 361 lb 3.2 oz (163.8 kg)   SpO2 97%   BMI 70.54 kg/m?  ?BP Readings from Last 3 Encounters:  ?07/12/21 117/72  ?04/13/21 130/82  ?08/02/20 139/89  ? ?Wt Readings from Last 3 Encounters:  ?07/12/21 (!) 361 lb 3.2 oz (163.8 kg)  ?04/13/21 (!) 368 lb 11.2 oz (167.2 kg)  ?08/02/20 (!) 335 lb (152 kg)  ? ?  ? ?Physical Exam ?Vitals and nursing note reviewed.  ?Constitutional:   ?   Appearance: She is obese.  ?HENT:  ?   Head: Normocephalic.  ?Eyes:  ?   Extraocular Movements: Extraocular movements intact.  ?   Conjunctiva/sclera: Conjunctivae normal.  ?   Pupils: Pupils are equal, round, and reactive to light.  ?Neck:  ?   Vascular: No carotid bruit.  ?Cardiovascular:  ?   Rate and Rhythm: Normal rate and regular rhythm.  ?   Pulses: Normal pulses.  ?   Heart sounds: Normal heart sounds.  ?Pulmonary:  ?   Effort: Pulmonary  effort is normal.  ?   Breath sounds: Normal breath sounds.  ?Abdominal:  ?   General: Bowel sounds are normal.  ?Musculoskeletal:  ?   Right lower leg: Edema present.  ?   Left lower leg: Edema present.  ?Skin: ?   General: Skin is warm and dry.  ?   Capillary Refill: Capillary refill takes less than 2 seconds.  ?Neurological:  ?   Mental Status: She is alert. Mental status is at baseline.  ?   Sensory: No sensory deficit.  ?   Motor: Weakness present.  ?   Coordination: Coordination abnormal.  ?   Gait: Gait abnormal.  ? ? ? ?Results for orders placed or performed in visit on 07/12/21  ?CBC with Differential/Platelet  ?Result Value Ref Range  ? WBC 8.3 3.4 - 10.8 x10E3/uL  ? RBC 4.88 3.77 - 5.28 x10E6/uL  ? Hemoglobin 13.5 11.1 - 15.9 g/dL  ? Hematocrit 40.5 34.0 - 46.6 %  ? MCV 83 79 - 97 fL  ? MCH 27.7 26.6 - 33.0 pg  ? MCHC 33.3 31.5 - 35.7 g/dL  ? RDW 13.9 11.7 - 15.4 %  ? Platelets 358 150 - 450 x10E3/uL  ?  Neutrophils 54 Not Estab. %  ? Lymphs 37 Not Estab. %  ? Monocytes 7 Not Estab. %  ? Eos 1 Not Estab. %  ? Basos 1 Not Estab. %  ? Neutrophils Absolute 4.5 1.4 - 7.0 x10E3/uL  ? Lymphocytes Absolute 3.1 0.7 - 3.1 x10E3/uL  ? Monocytes Absolute 0.6 0.1 - 0.9 x10E3/uL  ? EOS (ABSOLUTE) 0.1 0.0 - 0.4 x10E3/uL  ? Basophils Absolute 0.1 0.0 - 0.2 x10E3/uL  ? Immature Granulocytes 0 Not Estab. %  ? Immature Grans (Abs) 0.0 0.0 - 0.1 x10E3/uL  ?Comprehensive metabolic panel  ?Result Value Ref Range  ? Glucose 131 (H) 70 - 99 mg/dL  ? BUN 14 6 - 20 mg/dL  ? Creatinine, Ser 0.84 0.57 - 1.00 mg/dL  ? eGFR 101 >59 mL/min/1.73  ? BUN/Creatinine Ratio 17 9 - 23  ? Sodium 142 134 - 144 mmol/L  ? Potassium 4.5 3.5 - 5.2 mmol/L  ? Chloride 102 96 - 106 mmol/L  ? CO2 24 20 - 29 mmol/L  ? Calcium 9.5 8.7 - 10.2 mg/dL  ? Total Protein 7.6 6.0 - 8.5 g/dL  ? Albumin 4.1 3.9 - 5.0 g/dL  ? Globulin, Total 3.5 1.5 - 4.5 g/dL  ? Albumin/Globulin Ratio 1.2 1.2 - 2.2  ? Bilirubin Total 0.2 0.0 - 1.2 mg/dL  ? Alkaline Phosphatase 81  44 - 121 IU/L  ? AST 27 0 - 40 IU/L  ? ALT 37 (H) 0 - 32 IU/L  ?Lipid panel  ?Result Value Ref Range  ? Cholesterol, Total 231 (H) 100 - 199 mg/dL  ? Triglycerides 123 0 - 149 mg/dL  ? HDL 46 >39 mg/dL  ? VLDL Cholesterol Cal 22 5 - 40 mg/dL  ? LDL Chol Calc (NIH) 163 (H) 0 - 99 mg/dL  ? Chol/HDL Ratio 5.0 (H) 0.0 - 4.4 ratio  ?TSH  ?Result Value Ref Range  ? TSH 2.490 0.450 - 4.500 uIU/mL  ?VITAMIN D 25 Hydroxy (Vit-D Deficiency, Fractures)  ?Result Value Ref Range  ? Vit D, 25-Hydroxy 24.6 (L) 30.0 - 100.0 ng/mL  ?B12 and Folate Panel  ?Result Value Ref Range  ? Vitamin B-12 583 232 - 1,245 pg/mL  ? Folate >20.0 >3.0 ng/mL  ? ? ? ? ?The ASCVD Risk score (Arnett DK, et al., 2019) failed to calculate for the following reasons: ?  The 2019 ASCVD risk score is only valid for ages 58 to 49 ? ?  ?Assessment & Plan:  ? ?Problem List Items Addressed This Visit   ? ? Prader-Willi syndrome - Primary  ?  Chronic. Continued supervision from family.  ?At this time family is experiencing difficulties with limitations and this is contributing to increased eating.  ?She is not having improvement of picking habits with low dose sertraline. Discussed with father than this behavior may not resolve fully even with medication use, but we will increase the dose to see if this is helpful to control this.  ?Recommend avoidance of giving in with snacks and foods. Recommend healthy snack options when these are provided to keep overall calorie count to a minimum. Most effective foods would be very low carbohydrate and fat options, such as fresh vegetables and limited fruits.  ? ?  ?  ? Relevant Medications  ? sertraline (ZOLOFT) 100 MG tablet  ? Other Relevant Orders  ? CBC with Differential/Platelet (Completed)  ? Comprehensive metabolic panel (Completed)  ? Lipid panel (Completed)  ? POCT glycosylated hemoglobin (Hb A1C)  ? TSH (  Completed)  ? VITAMIN D 25 Hydroxy (Vit-D Deficiency, Fractures) (Completed)  ? POCT UA - Microalbumin  ? B12  and Folate Panel (Completed)  ? Compulsive skin picking  ?  Chronic. Excessive scarring is apparent from repeated picking behaviors. Will trial increase in sertraline to see if this is helpful to reduce these behaviors. Unfortunately, this may not be controllable given her underlying condition and we did discuss this today. We will work to help best manage her condition and behaviors to provide the best expectations and overall health ? ?  ?  ? Relevant Medications  ? sertraline (ZOLOFT) 100 MG tablet  ? Other Relevant Orders  ? CBC with Differential/Platelet (Completed)  ? TSH (Completed)  ? VITAMIN D 25 Hydroxy (Vit-D Deficiency, Fractures) (Completed)  ? B12 and Folate Panel (Completed)  ? Type 2 diabetes mellitus with hyperglycemia, with long-term current use of insulin ([place])  ?  Chronic. Not well controlled at this time despite medication management due to Manuela Neptune. She is very limited in her physical activity, which is certainly contributing to her elevation in blood sugars as is the compulsive food intake. At this time she is still taking food at any opportunity she has and this will likely be the case due to her condition. Recommend keeping unhealthy snack options out of patients reach or placed where she cannot have access. OK to provide low carbohydrate healthy snack options for her to choose from such as fresh vegetables to help satisfy her needs and improve her overall health. Recommend no soda or juice intake as this significantly impacts blood sugars with no added benefit to her overall health. Will monitor labs today for evaluation and make recommendations based on findings.Father would like to change Trulicity to [place] if insurance will approve. We can certainly try this as it may be effective to help with her blood sugars. Will f/u in 3 months.  ? ?  ?  ? Relevant Medications  ? metFORMIN (GLUCOPHAGE) 500 MG tablet  ? tirzepatide Darcel Bayley) 5 MG/0.5ML Pen (Start on 08/06/2021)  ? Other  Relevant Orders  ? CBC with Differential/Platelet (Completed)  ? Comprehensive metabolic panel (Completed)  ? Lipid panel (Completed)  ? POCT glycosylated hemoglobin (Hb A1C)  ? TSH (Completed)  ? VITAMIN D 25 Hydroxy (

## 2021-07-13 LAB — CBC WITH DIFFERENTIAL/PLATELET
Basophils Absolute: 0.1 10*3/uL (ref 0.0–0.2)
Basos: 1 %
EOS (ABSOLUTE): 0.1 10*3/uL (ref 0.0–0.4)
Eos: 1 %
Hematocrit: 40.5 % (ref 34.0–46.6)
Hemoglobin: 13.5 g/dL (ref 11.1–15.9)
Immature Grans (Abs): 0 10*3/uL (ref 0.0–0.1)
Immature Granulocytes: 0 %
Lymphocytes Absolute: 3.1 10*3/uL (ref 0.7–3.1)
Lymphs: 37 %
MCH: 27.7 pg (ref 26.6–33.0)
MCHC: 33.3 g/dL (ref 31.5–35.7)
MCV: 83 fL (ref 79–97)
Monocytes Absolute: 0.6 10*3/uL (ref 0.1–0.9)
Monocytes: 7 %
Neutrophils Absolute: 4.5 10*3/uL (ref 1.4–7.0)
Neutrophils: 54 %
Platelets: 358 10*3/uL (ref 150–450)
RBC: 4.88 x10E6/uL (ref 3.77–5.28)
RDW: 13.9 % (ref 11.7–15.4)
WBC: 8.3 10*3/uL (ref 3.4–10.8)

## 2021-07-13 LAB — COMPREHENSIVE METABOLIC PANEL
ALT: 37 IU/L — ABNORMAL HIGH (ref 0–32)
AST: 27 IU/L (ref 0–40)
Albumin/Globulin Ratio: 1.2 (ref 1.2–2.2)
Albumin: 4.1 g/dL (ref 3.9–5.0)
Alkaline Phosphatase: 81 IU/L (ref 44–121)
BUN/Creatinine Ratio: 17 (ref 9–23)
BUN: 14 mg/dL (ref 6–20)
Bilirubin Total: 0.2 mg/dL (ref 0.0–1.2)
CO2: 24 mmol/L (ref 20–29)
Calcium: 9.5 mg/dL (ref 8.7–10.2)
Chloride: 102 mmol/L (ref 96–106)
Creatinine, Ser: 0.84 mg/dL (ref 0.57–1.00)
Globulin, Total: 3.5 g/dL (ref 1.5–4.5)
Glucose: 131 mg/dL — ABNORMAL HIGH (ref 70–99)
Potassium: 4.5 mmol/L (ref 3.5–5.2)
Sodium: 142 mmol/L (ref 134–144)
Total Protein: 7.6 g/dL (ref 6.0–8.5)
eGFR: 101 mL/min/{1.73_m2} (ref >59)

## 2021-07-13 LAB — LIPID PANEL
Chol/HDL Ratio: 5 ratio — ABNORMAL HIGH (ref 0.0–4.4)
Cholesterol, Total: 231 mg/dL — ABNORMAL HIGH (ref 100–199)
HDL: 46 mg/dL (ref >39)
LDL Chol Calc (NIH): 163 mg/dL — ABNORMAL HIGH (ref 0–99)
Triglycerides: 123 mg/dL (ref 0–149)
VLDL Cholesterol Cal: 22 mg/dL (ref 5–40)

## 2021-07-13 LAB — B12 AND FOLATE PANEL
Folate: >20 ng/mL (ref >3.0)
Vitamin B-12: 583 pg/mL (ref 232–1245)

## 2021-07-13 LAB — TSH: TSH: 2.49 u[IU]/mL (ref 0.450–4.500)

## 2021-07-13 LAB — VITAMIN D 25 HYDROXY (VIT D DEFICIENCY, FRACTURES): Vit D, 25-Hydroxy: 24.6 ng/mL — ABNORMAL LOW (ref 30.0–100.0)

## 2021-07-20 NOTE — Assessment & Plan Note (Signed)
BMI significantly elevated in the setting of [name], DM, and sedentary lifestyle. [name] is very limited in her ability to perform physical activity due to her medical conditions and size. She has enjoyed swimming in the past, but is no longer involved in this activity. I do feel that activity would greatly benefit her physically and emotionally and provide and outlet for her. Encouraged dad to look into swimming options for [facility] and encourage increased physical activity to help with her joints, blood sugar, and weight management.  ?

## 2021-07-20 NOTE — Assessment & Plan Note (Signed)
Chronic. Not well controlled at this time despite medication management due to [name]. She is very limited in her physical activity, which is certainly contributing to her elevation in blood sugars as is the compulsive food intake. At this time she is still taking food at any opportunity she has and this will likely be the case due to her condition. Recommend keeping unhealthy snack options out of patients reach or placed where she cannot have access. OK to provide low carbohydrate healthy snack options for her to choose from such as fresh vegetables to help satisfy her needs and improve her overall health. Recommend no soda or juice intake as this significantly impacts blood sugars with no added benefit to her overall health. Will monitor labs today for evaluation and make recommendations based on findings.Father would like to change Trulicity to [place] if insurance will approve. We can certainly try this as it may be effective to help with her blood sugars. Will f/u in 3 months.  ?

## 2021-07-20 NOTE — Assessment & Plan Note (Signed)
Chronic. Continued supervision from family.  ?At this time family is experiencing difficulties with limitations and this is contributing to increased eating.  ?She is not having improvement of picking habits with low dose sertraline. Discussed with father than this behavior may not resolve fully even with medication use, but we will increase the dose to see if this is helpful to control this.  ?Recommend avoidance of giving in with snacks and foods. Recommend healthy snack options when these are provided to keep overall calorie count to a minimum. Most effective foods would be very low carbohydrate and fat options, such as fresh vegetables and limited fruits.  ?

## 2021-07-20 NOTE — Assessment & Plan Note (Signed)
Chronic. Excessive scarring is apparent from repeated picking behaviors. Will trial increase in sertraline to see if this is helpful to reduce these behaviors. Unfortunately, this may not be controllable given her underlying condition and we did discuss this today. We will work to help best manage her condition and behaviors to provide the best expectations and overall health ?

## 2021-08-15 ENCOUNTER — Other Ambulatory Visit (HOSPITAL_BASED_OUTPATIENT_CLINIC_OR_DEPARTMENT_OTHER): Payer: Self-pay

## 2021-08-15 ENCOUNTER — Encounter (HOSPITAL_BASED_OUTPATIENT_CLINIC_OR_DEPARTMENT_OTHER): Payer: Self-pay | Admitting: Nurse Practitioner

## 2021-08-15 DIAGNOSIS — Q8711 Prader-Willi syndrome: Secondary | ICD-10-CM

## 2021-08-15 DIAGNOSIS — F424 Excoriation (skin-picking) disorder: Secondary | ICD-10-CM

## 2021-08-16 MED ORDER — SERTRALINE HCL 100 MG PO TABS: ORAL_TABLET | ORAL | 3 refills | Status: DC

## 2021-09-24 ENCOUNTER — Encounter (HOSPITAL_BASED_OUTPATIENT_CLINIC_OR_DEPARTMENT_OTHER): Payer: Self-pay | Admitting: Nurse Practitioner

## 2021-09-26 ENCOUNTER — Other Ambulatory Visit (HOSPITAL_BASED_OUTPATIENT_CLINIC_OR_DEPARTMENT_OTHER): Payer: Self-pay | Admitting: Nurse Practitioner

## 2021-09-26 ENCOUNTER — Other Ambulatory Visit (HOSPITAL_BASED_OUTPATIENT_CLINIC_OR_DEPARTMENT_OTHER): Payer: Self-pay

## 2021-09-26 DIAGNOSIS — Q8711 Prader-Willi syndrome: Secondary | ICD-10-CM

## 2021-09-26 DIAGNOSIS — F424 Excoriation (skin-picking) disorder: Secondary | ICD-10-CM

## 2021-09-26 MED ORDER — SERTRALINE HCL 100 MG PO TABS
ORAL_TABLET | ORAL | 3 refills | Status: DC
  Filled 2021-09-26: qty 180, 90d supply, fill #0

## 2021-10-05 ENCOUNTER — Encounter (HOSPITAL_BASED_OUTPATIENT_CLINIC_OR_DEPARTMENT_OTHER): Payer: Self-pay | Admitting: Nurse Practitioner

## 2021-10-05 DIAGNOSIS — F424 Excoriation (skin-picking) disorder: Secondary | ICD-10-CM

## 2021-10-05 DIAGNOSIS — Z794 Long term (current) use of insulin: Secondary | ICD-10-CM

## 2021-10-05 DIAGNOSIS — Z6841 Body Mass Index (BMI) 40.0 and over, adult: Secondary | ICD-10-CM

## 2021-10-07 ENCOUNTER — Other Ambulatory Visit (HOSPITAL_BASED_OUTPATIENT_CLINIC_OR_DEPARTMENT_OTHER): Payer: Self-pay | Admitting: Nurse Practitioner

## 2021-10-07 DIAGNOSIS — T148XXA Other injury of unspecified body region, initial encounter: Secondary | ICD-10-CM

## 2021-10-07 MED ORDER — MUPIROCIN 2 % EX OINT: TOPICAL_OINTMENT | CUTANEOUS | 5 refills | Status: DC

## 2021-10-10 ENCOUNTER — Other Ambulatory Visit (HOSPITAL_BASED_OUTPATIENT_CLINIC_OR_DEPARTMENT_OTHER): Payer: Self-pay

## 2021-10-10 MED ORDER — TIRZEPATIDE 7.5 MG/0.5ML ~~LOC~~ SOAJ
7.5000 mg | SUBCUTANEOUS | 2 refills | Status: DC
  Filled 2021-10-10: qty 2, 28d supply, fill #0

## 2021-10-10 MED ORDER — TOPIRAMATE 25 MG PO TABS
25.0000 mg | ORAL_TABLET | Freq: Two times a day (BID) | ORAL | 1 refills | Status: DC
  Filled 2021-10-10: qty 60, 30d supply, fill #0

## 2021-10-11 ENCOUNTER — Other Ambulatory Visit (HOSPITAL_BASED_OUTPATIENT_CLINIC_OR_DEPARTMENT_OTHER): Payer: Self-pay

## 2021-10-11 ENCOUNTER — Telehealth (HOSPITAL_BASED_OUTPATIENT_CLINIC_OR_DEPARTMENT_OTHER): Payer: Medicare Other | Admitting: Nurse Practitioner

## 2021-10-11 ENCOUNTER — Telehealth (HOSPITAL_BASED_OUTPATIENT_CLINIC_OR_DEPARTMENT_OTHER): Payer: Self-pay | Admitting: Nurse Practitioner

## 2021-10-11 NOTE — Telephone Encounter (Signed)
Spoke to pt's father, scheduled 40 min visit for 8/22 @ 1:30 pm.

## 2021-10-11 NOTE — Telephone Encounter (Signed)
Please call patients father, [name], to schedule follow-up in 4 weeks for monitoring of new medications. (49m).  It is ok to make this virtual or in person, whichever is most convenient for them.  Thank you.

## 2021-10-12 ENCOUNTER — Other Ambulatory Visit (HOSPITAL_BASED_OUTPATIENT_CLINIC_OR_DEPARTMENT_OTHER): Payer: Self-pay

## 2021-11-02 ENCOUNTER — Encounter (HOSPITAL_BASED_OUTPATIENT_CLINIC_OR_DEPARTMENT_OTHER): Payer: Self-pay | Admitting: Nurse Practitioner

## 2021-11-07 ENCOUNTER — Other Ambulatory Visit (HOSPITAL_BASED_OUTPATIENT_CLINIC_OR_DEPARTMENT_OTHER): Payer: Self-pay

## 2021-11-07 DIAGNOSIS — T148XXA Other injury of unspecified body region, initial encounter: Secondary | ICD-10-CM

## 2021-11-07 MED ORDER — MUPIROCIN 2 % EX OINT
TOPICAL_OINTMENT | CUTANEOUS | 5 refills | Status: DC
  Filled 2021-11-07: qty 22, 10d supply, fill #0

## 2021-11-15 ENCOUNTER — Other Ambulatory Visit (HOSPITAL_BASED_OUTPATIENT_CLINIC_OR_DEPARTMENT_OTHER): Payer: Self-pay

## 2021-11-15 ENCOUNTER — Ambulatory Visit (INDEPENDENT_AMBULATORY_CARE_PROVIDER_SITE_OTHER): Payer: Medicare Other | Admitting: Nurse Practitioner

## 2021-11-15 ENCOUNTER — Encounter (HOSPITAL_BASED_OUTPATIENT_CLINIC_OR_DEPARTMENT_OTHER): Payer: Self-pay | Admitting: Nurse Practitioner

## 2021-11-15 VITALS — BP 140/74 | HR 74 | Ht 60.0 in | Wt 384.0 lb

## 2021-11-15 DIAGNOSIS — Z6841 Body Mass Index (BMI) 40.0 and over, adult: Secondary | ICD-10-CM

## 2021-11-15 DIAGNOSIS — F424 Excoriation (skin-picking) disorder: Secondary | ICD-10-CM

## 2021-11-15 DIAGNOSIS — T148XXA Other injury of unspecified body region, initial encounter: Secondary | ICD-10-CM | POA: Diagnosis not present

## 2021-11-15 DIAGNOSIS — E559 Vitamin D deficiency, unspecified: Secondary | ICD-10-CM | POA: Diagnosis not present

## 2021-11-15 DIAGNOSIS — Q8711 Prader-Willi syndrome: Secondary | ICD-10-CM | POA: Diagnosis not present

## 2021-11-15 DIAGNOSIS — E1165 Type 2 diabetes mellitus with hyperglycemia: Secondary | ICD-10-CM

## 2021-11-15 DIAGNOSIS — Z794 Long term (current) use of insulin: Secondary | ICD-10-CM

## 2021-11-15 MED ORDER — MUPIROCIN 2 % EX OINT
TOPICAL_OINTMENT | CUTANEOUS | 6 refills | Status: DC
  Filled 2021-11-15: qty 22, 7d supply, fill #0
  Filled 2021-12-09: qty 22, 7d supply, fill #1
  Filled 2022-02-02: qty 22, 7d supply, fill #2
  Filled 2022-03-02: qty 22, 7d supply, fill #3
  Filled 2022-05-22: qty 22, 7d supply, fill #4
  Filled 2022-05-31: qty 22, 7d supply, fill #5

## 2021-11-15 MED ORDER — SERTRALINE HCL 100 MG PO TABS
ORAL_TABLET | ORAL | 3 refills | Status: DC
  Filled 2021-11-15: qty 180, fill #0
  Filled 2021-12-31: qty 180, 90d supply, fill #0

## 2021-11-15 MED ORDER — MOMETASONE FUROATE 0.1 % EX CREA
TOPICAL_CREAM | CUTANEOUS | 11 refills | Status: AC
  Filled 2021-11-15: qty 45, 30d supply, fill #0
  Filled 2021-12-09: qty 45, 30d supply, fill #1
  Filled 2022-03-02: qty 45, 30d supply, fill #2
  Filled 2022-06-23: qty 45, 30d supply, fill #3

## 2021-11-15 MED ORDER — LANTUS SOLOSTAR 100 UNIT/ML ~~LOC~~ SOPN
30.0000 [IU] | PEN_INJECTOR | Freq: Every day | SUBCUTANEOUS | 6 refills | Status: DC
  Filled 2021-11-15 – 2022-02-02 (×3): qty 15, 50d supply, fill #0
  Filled 2022-03-02: qty 15, 50d supply, fill #1

## 2021-11-15 MED ORDER — TIRZEPATIDE 10 MG/0.5ML ~~LOC~~ SOAJ
10.0000 mg | SUBCUTANEOUS | 3 refills | Status: DC
  Filled 2021-11-15: qty 2, 28d supply, fill #0
  Filled 2021-12-09: qty 2, 28d supply, fill #1

## 2021-11-15 NOTE — Progress Notes (Signed)
Rebekah Clamp, DNP, AGNP-c [facility] [address] [place], [place] [address] [phone] Office [phone] Fax  ESTABLISHED PATIENT- Chronic Health and/or Follow-Up Visit  Vitals:   11/15/21 1337  BP: (!) 140/74  Pulse: 74  Height: 5' (1.524 m)  Weight: (!) 384 lb (174.2 kg)  SpO2: 99%  BMI (Calculated): 74.99     [name] is a 21 y.o. year old female presenting today for evaluation and management of the following: Follow-up (Patient presents today for follow up. Dad is concerned about her weight, she is still picking. Dad states she is stealing food. )   BRIEF HISTORY, IMPRESSION, RECOMMENDATIONS, and ROS  1. Prader-Willi syndrome Her dad tells me that she has compulsive issues with picking of the skin and stealing food being the primary concerns.  She predominantly steals food from the kitchen when dad is taking a nap or when he goes to the restroom.   2. Compulsive skin picking [name] is still taking sertraline 200mg  to help with compulsive skin picking associated with prader willi syndrome. She has been  She was tried on topamax, but her dad tells me that about 3 days after starting the medicatoin the picking became worse. He did stop the medication due to that side effect. No continued symptoms. [name] tells me that she does not know why she starts picking. She reports that the areas "just make me want to pick".  3. Type 2 diabetes mellitus with hyperglycemia, with long-term current use of insulin [facility]) [name] is due for a foot exam, urine micro albumin, eye exam, and labs today.  Her dad tells me that she is frequently stealing food from the kitchen and over eating causing her blood sugars to spike. When she is eating the amount given to her her blood sugars are in the 140's on average. He reports that when he goes to bed or leaves the room she immediately goes to try to steal food from the kitchen. The house has  other family members (teenagers) present and keeping food locked in cabinets is not feasible.   4. BMI 70 and over, adult [facility]) Dad tells me that [name] primarily sedentary. She does walk with assistance of her walker when going longer distances, but even with the assistance device she still does not move around much.    All ROS negative with exception of what is listed above.   PHYSICAL EXAM Physical Exam Vitals and nursing note reviewed.  Constitutional:      Appearance: She is obese.  HENT:     Head: Atraumatic.     Right Ear: Hearing normal.     Left Ear: Hearing normal.     Nose: Nose normal.     Mouth/Throat:     Lips: Pink.     Mouth: Mucous membranes are moist.  Eyes:     General: Vision grossly intact.  Neck:     Vascular: No carotid bruit.  Cardiovascular:     Rate and Rhythm: Normal rate and regular rhythm. No extrasystoles are present.    Pulses: No decreased pulses.  Pulmonary:     Effort: Pulmonary effort is normal.     Breath sounds: Normal breath sounds and air entry.  Abdominal:     General: Abdomen is protuberant. Bowel sounds are increased.     Tenderness: There is no abdominal tenderness.  Musculoskeletal:     Cervical back: Full passive range of motion without pain.     Right lower  leg: Edema present.     Left lower leg: Edema present.  Skin:    General: Skin is warm and dry.     Findings: Lesion present.     Comments: Multiple scattered scars and lesions on the skin including arms, abdomen, and legs from scratching and picking at the skin obsessively. Various stages of healing present. No signs of infection.   Neurological:     Mental Status: She is alert. Mental status is at baseline.     Motor: Weakness present.     Gait: Gait abnormal.     PLAN Problem List Items Addressed This Visit     Prader-Willi syndrome - Primary    Chronic. Continue close supervision. Encourage interaction with family and friends. Increase physical activity. Attempt  to keep food items put out of access for her. Allow foods such as vegetables for snacks.       Relevant Medications   insulin glargine (LANTUS SOLOSTAR) 100 UNIT/ML Solostar Pen   sertraline (ZOLOFT) 100 MG tablet   Compulsive skin picking    Chronic. Behavior associated with [name]. No significant change with sertraline. Unable to tolerate topiramate- caused increased picking behaviors. Continue sertraline and mupirocin. Start steroid cream on healed areas to see if this helps reduce recurrent picking.       Relevant Medications   mometasone (ELOCON) 0.1 % cream   sertraline (ZOLOFT) 100 MG tablet   Other Relevant Orders   CBC With Diff/Platelet (Completed)   Comprehensive metabolic panel (Completed)   Hemoglobin A1c (Completed)   VITAMIN D 25 Hydroxy (Vit-D Deficiency, Fractures) (Completed)   Type 2 diabetes mellitus with hyperglycemia, with long-term current use of insulin (HCC)    Chronic. Difficult to control due to underlying conditions with Prader Willi Syndrome. Unfortunately, it is very difficult for the family to control her intake at all times due to food obsession and sneaking behaviors. This has contributed to significant weight gain and poor control of her diabetes. We have had some luck with Mounjaro, however, no specific appetite reduction has been noted. We will plan to continue [facility] and continue Lantus 30U at bedtime. Encouraged increased physical activity and attempt to keep foods out of her ability to reach when not supervised.       Relevant Medications   insulin glargine (LANTUS SOLOSTAR) 100 UNIT/ML Solostar Pen   Other Relevant Orders   CBC With Diff/Platelet (Completed)   Comprehensive metabolic panel (Completed)   Hemoglobin A1c (Completed)   VITAMIN D 25 Hydroxy (Vit-D Deficiency, Fractures) (Completed)   POCT UA - Microalbumin   HM DIABETES FOOT EXAM (Completed)   Open wound of skin    Chronic picking with multiple open wounds present. Continue  using mupirocin to help reduce chance of infection. Consider the areas may be itching. Trial of topical corticosteroid on the areas that have healed to see if this is helpful to prevent picking and scratching.       Relevant Medications   mupirocin ointment (BACTROBAN) 2 %   BMI 70 and over, adult (HCC)    Chronic. Prader [name] syndrome with inability to stop eating and habits of sneaking foods when not closely supervised. Patient is also very inactive, which contributes. Encourage increased activity to help keep her mind off of food and burn excess energy. Food limitations to appropriate caloric amount and size. Continue with mounjaro.       Relevant Orders   CBC With Diff/Platelet (Completed)   Comprehensive metabolic panel (Completed)   Hemoglobin  A1c (Completed)   VITAMIN D 25 Hydroxy (Vit-D Deficiency, Fractures) (Completed)   POCT UA - Microalbumin   HM DIABETES FOOT EXAM (Completed)   Other Visit Diagnoses     Vitamin D deficiency       Relevant Medications   Vitamin D, Ergocalciferol, (DRISDOL) 1.25 MG (50000 UNIT) CAPS capsule         FOLLOW-UP Return in about 4 months (around 03/17/2022) for Diabetes and Prader Willi Follow-up.   Worthy Keeler, DNP, AGNP-c

## 2021-11-15 NOTE — Patient Instructions (Addendum)
It was a pleasure seeing you today. I hope your time spent with [place] was pleasant and helpful. Please let us know if there is anything we can do to improve the service you receive.   Today we discussed concerns with:  Prader-Willi syndrome  Compulsive skin picking  Type 2 diabetes mellitus with hyperglycemia, with long-term current use of insulin (HCC)  BMI 70 and over, adult [facility])  We will plan to update her labs today. I sent refills on her medications today to the pharmacy downstairs. I have sent in a cream called mometasone for the skin picking     Important Office Information Lab Results If labs were ordered, please note that you will see results through MyChart as soon as they come available from [facility].  It takes up to 5 business days for the results to be routed to me and for me to review them once all of the lab results have come through from [facility]. I will make recommendations based on your results and send these through MyChart or someone from the office will call you to discuss. If your labs are abnormal, we may contact you to schedule a visit to discuss the results and make recommendations.  If you have not heard from [place] within 5 business days or you have waited longer than a week and your lab results have not come through on MyChart, please feel free to call the office or send a message through MyChart to follow-up on these labs.   Referrals If referrals were placed today, the office where the referral was sent will contact you either by phone or through MyChart to set up scheduling. Please note that it can take up to a week for the referral office to contact you. If you do not hear from them in a week, please contact the referral office directly to inquire about scheduling.   Condition Treated If your condition worsens or you begin to have new symptoms, please schedule a follow-up appointment for further evaluation. If you are not sure if an appointment is needed, you may  call the office to leave a message for the nurse and someone will contact you with recommendations.  If you have an urgent or life threatening emergency, please do not call the office, but seek emergency evaluation by calling 911 or going to the nearest emergency room for evaluation.   MyChart and Phone Calls Please do not use MyChart for urgent messages. It may take up to 3 business days for MyChart messages to be read by staff and if they are unable to handle the request, an additional 3 business days for them to be routed to me and for my response.  Messages sent to the provider through MyChart do not come directly to the provider, please allow time for these messages to be routed and for me to respond.  We get a large volume of MyChart messages daily and these are responded to in the order received.   For urgent messages, please call the office at [phone] and speak with the front office staff or leave a message on the line of my assistant for guidance.  We are seeing patients from the hours of 8:00 am through 5:00 pm and calls directly to the nurse may not be answered immediately due to seeing patients, but your call will be returned as soon as possible.  Phone  messages received after 4:00 PM Monday through Thursday may not be returned until the following business day. Phone messages  received after 11:00 AM on Friday may not be returned until Monday.   After Hours We share on call hours with providers from other offices. If you have an urgent need after hours that cannot wait until the next business day, please contact the on call provider by calling the office number. A nurse will speak with you and contact the provider if needed for recommendations.  If you have an urgent or life threatening emergency after hours, please do not call the on call provider, but seek emergency evaluation by calling 911 or going to the nearest emergency room for evaluation.   Paperwork All paperwork requires  a minimum of 5 days to complete and return to you or the designated personnel. Please keep this in mind when bringing in forms or sending requests for paperwork completion to the office.

## 2021-11-16 LAB — COMPREHENSIVE METABOLIC PANEL
ALT: 33 IU/L — ABNORMAL HIGH (ref 0–32)
AST: 25 IU/L (ref 0–40)
Albumin/Globulin Ratio: 1.2 (ref 1.2–2.2)
Albumin: 4.2 g/dL (ref 4.0–5.0)
Alkaline Phosphatase: 84 IU/L (ref 44–121)
BUN/Creatinine Ratio: 14 (ref 9–23)
BUN: 11 mg/dL (ref 6–20)
Bilirubin Total: 0.3 mg/dL (ref 0.0–1.2)
CO2: 23 mmol/L (ref 20–29)
Calcium: 9.5 mg/dL (ref 8.7–10.2)
Chloride: 102 mmol/L (ref 96–106)
Creatinine, Ser: 0.77 mg/dL (ref 0.57–1.00)
Globulin, Total: 3.5 g/dL (ref 1.5–4.5)
Glucose: 102 mg/dL — ABNORMAL HIGH (ref 70–99)
Potassium: 5.1 mmol/L (ref 3.5–5.2)
Sodium: 141 mmol/L (ref 134–144)
Total Protein: 7.7 g/dL (ref 6.0–8.5)
eGFR: 112 mL/min/{1.73_m2} (ref >59)

## 2021-11-16 LAB — CBC WITH DIFF/PLATELET
Basophils Absolute: 0.1 10*3/uL (ref 0.0–0.2)
Basos: 1 %
EOS (ABSOLUTE): 0.2 10*3/uL (ref 0.0–0.4)
Eos: 2 %
Hematocrit: 40.4 % (ref 34.0–46.6)
Hemoglobin: 12.7 g/dL (ref 11.1–15.9)
Immature Grans (Abs): 0 10*3/uL (ref 0.0–0.1)
Immature Granulocytes: 0 %
Lymphocytes Absolute: 3.1 10*3/uL (ref 0.7–3.1)
Lymphs: 35 %
MCH: 25.8 pg — ABNORMAL LOW (ref 26.6–33.0)
MCHC: 31.4 g/dL — ABNORMAL LOW (ref 31.5–35.7)
MCV: 82 fL (ref 79–97)
Monocytes Absolute: 0.8 10*3/uL (ref 0.1–0.9)
Monocytes: 9 %
Neutrophils Absolute: 4.8 10*3/uL (ref 1.4–7.0)
Neutrophils: 53 %
Platelets: 389 10*3/uL (ref 150–450)
RBC: 4.93 x10E6/uL (ref 3.77–5.28)
RDW: 15.6 % — ABNORMAL HIGH (ref 11.7–15.4)
WBC: 9 10*3/uL (ref 3.4–10.8)

## 2021-11-16 LAB — HEMOGLOBIN A1C
Est. average glucose Bld gHb Est-mCnc: 174 mg/dL
Hgb A1c MFr Bld: 7.7 % — ABNORMAL HIGH (ref 4.8–5.6)

## 2021-11-16 LAB — VITAMIN D 25 HYDROXY (VIT D DEFICIENCY, FRACTURES): Vit D, 25-Hydroxy: 20.4 ng/mL — ABNORMAL LOW (ref 30.0–100.0)

## 2021-11-18 ENCOUNTER — Other Ambulatory Visit (HOSPITAL_BASED_OUTPATIENT_CLINIC_OR_DEPARTMENT_OTHER): Payer: Self-pay

## 2021-11-18 MED ORDER — VITAMIN D (ERGOCALCIFEROL) 1.25 MG (50000 UNIT) PO CAPS
50000.0000 [IU] | ORAL_CAPSULE | ORAL | 3 refills | Status: DC
  Filled 2021-11-18: qty 12, 84d supply, fill #0
  Filled 2022-02-02: qty 12, 84d supply, fill #1
  Filled 2022-03-02 – 2022-04-11 (×2): qty 12, 84d supply, fill #2
  Filled 2022-05-22: qty 12, 84d supply, fill #3

## 2021-12-09 ENCOUNTER — Other Ambulatory Visit (HOSPITAL_BASED_OUTPATIENT_CLINIC_OR_DEPARTMENT_OTHER): Payer: Self-pay

## 2021-12-12 ENCOUNTER — Other Ambulatory Visit (HOSPITAL_BASED_OUTPATIENT_CLINIC_OR_DEPARTMENT_OTHER): Payer: Self-pay

## 2021-12-12 ENCOUNTER — Encounter (HOSPITAL_BASED_OUTPATIENT_CLINIC_OR_DEPARTMENT_OTHER): Payer: Self-pay | Admitting: Nurse Practitioner

## 2021-12-12 ENCOUNTER — Other Ambulatory Visit (HOSPITAL_BASED_OUTPATIENT_CLINIC_OR_DEPARTMENT_OTHER): Payer: Self-pay | Admitting: Nurse Practitioner

## 2021-12-12 DIAGNOSIS — Z7689 Persons encountering health services in other specified circumstances: Secondary | ICD-10-CM

## 2021-12-12 DIAGNOSIS — E1165 Type 2 diabetes mellitus with hyperglycemia: Secondary | ICD-10-CM

## 2021-12-12 MED ORDER — TIRZEPATIDE 12.5 MG/0.5ML ~~LOC~~ SOAJ
12.5000 mg | SUBCUTANEOUS | 1 refills | Status: DC
  Filled 2021-12-12: qty 2, 28d supply, fill #0
  Filled 2022-01-02: qty 2, 28d supply, fill #1

## 2021-12-23 NOTE — Assessment & Plan Note (Signed)
Chronic. Behavior associated with [name]. No significant change with sertraline. Unable to tolerate topiramate- caused increased picking behaviors. Continue sertraline and mupirocin. Start steroid cream on healed areas to see if this helps reduce recurrent picking.

## 2021-12-23 NOTE — Assessment & Plan Note (Signed)
Chronic. Difficult to control due to underlying conditions with Prader Willi Syndrome. Unfortunately, it is very difficult for the family to control her intake at all times due to food obsession and sneaking behaviors. This has contributed to significant weight gain and poor control of her diabetes. We have had some luck with Mounjaro, however, no specific appetite reduction has been noted. We will plan to continue [facility] and continue Lantus 30U at bedtime. Encouraged increased physical activity and attempt to keep foods out of her ability to reach when not supervised.

## 2021-12-23 NOTE — Assessment & Plan Note (Signed)
Chronic picking with multiple open wounds present. Continue using mupirocin to help reduce chance of infection. Consider the areas may be itching. Trial of topical corticosteroid on the areas that have healed to see if this is helpful to prevent picking and scratching.

## 2021-12-23 NOTE — Assessment & Plan Note (Signed)
Chronic. Continue close supervision. Encourage interaction with family and friends. Increase physical activity. Attempt to keep food items put out of access for her. Allow foods such as vegetables for snacks.

## 2021-12-23 NOTE — Assessment & Plan Note (Signed)
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 Patient is also very inactive, which contributes. Encourage increased activity to help keep her mind off of food and burn excess energy. Food limitations to appropriate caloric amount and size. Continue with mounjaro.

## 2022-01-01 ENCOUNTER — Other Ambulatory Visit (HOSPITAL_BASED_OUTPATIENT_CLINIC_OR_DEPARTMENT_OTHER): Payer: Self-pay

## 2022-01-02 ENCOUNTER — Other Ambulatory Visit (HOSPITAL_BASED_OUTPATIENT_CLINIC_OR_DEPARTMENT_OTHER): Payer: Self-pay | Admitting: Nurse Practitioner

## 2022-01-02 ENCOUNTER — Other Ambulatory Visit (HOSPITAL_BASED_OUTPATIENT_CLINIC_OR_DEPARTMENT_OTHER): Payer: Self-pay

## 2022-01-02 DIAGNOSIS — Z6841 Body Mass Index (BMI) 40.0 and over, adult: Secondary | ICD-10-CM

## 2022-01-02 DIAGNOSIS — E7800 Pure hypercholesterolemia, unspecified: Secondary | ICD-10-CM

## 2022-01-02 DIAGNOSIS — R030 Elevated blood-pressure reading, without diagnosis of hypertension: Secondary | ICD-10-CM

## 2022-01-02 DIAGNOSIS — E1165 Type 2 diabetes mellitus with hyperglycemia: Secondary | ICD-10-CM

## 2022-01-02 MED ORDER — TIRZEPATIDE 15 MG/0.5ML ~~LOC~~ SOAJ
15.0000 mg | SUBCUTANEOUS | 2 refills | Status: DC
  Filled 2022-01-02: qty 6, 84d supply, fill #0
  Filled 2022-03-02: qty 6, 84d supply, fill #1
  Filled 2022-03-03: qty 2, 28d supply, fill #1
  Filled 2022-03-03 – 2022-03-06 (×3): qty 6, 84d supply, fill #1

## 2022-01-03 ENCOUNTER — Other Ambulatory Visit (HOSPITAL_BASED_OUTPATIENT_CLINIC_OR_DEPARTMENT_OTHER): Payer: Self-pay

## 2022-01-30 ENCOUNTER — Encounter (HOSPITAL_BASED_OUTPATIENT_CLINIC_OR_DEPARTMENT_OTHER): Payer: Self-pay

## 2022-02-02 ENCOUNTER — Other Ambulatory Visit (HOSPITAL_BASED_OUTPATIENT_CLINIC_OR_DEPARTMENT_OTHER): Payer: Self-pay

## 2022-02-28 ENCOUNTER — Ambulatory Visit: Payer: Medicare Other | Admitting: Nurse Practitioner

## 2022-03-02 ENCOUNTER — Other Ambulatory Visit (HOSPITAL_BASED_OUTPATIENT_CLINIC_OR_DEPARTMENT_OTHER): Payer: Self-pay

## 2022-03-03 ENCOUNTER — Encounter (HOSPITAL_BASED_OUTPATIENT_CLINIC_OR_DEPARTMENT_OTHER): Payer: Self-pay | Admitting: Pharmacist

## 2022-03-03 ENCOUNTER — Other Ambulatory Visit (HOSPITAL_BASED_OUTPATIENT_CLINIC_OR_DEPARTMENT_OTHER): Payer: Self-pay

## 2022-03-06 ENCOUNTER — Encounter: Payer: Self-pay | Admitting: Nurse Practitioner

## 2022-03-06 ENCOUNTER — Ambulatory Visit (INDEPENDENT_AMBULATORY_CARE_PROVIDER_SITE_OTHER): Payer: Medicare Other | Admitting: Nurse Practitioner

## 2022-03-06 ENCOUNTER — Other Ambulatory Visit (HOSPITAL_BASED_OUTPATIENT_CLINIC_OR_DEPARTMENT_OTHER): Payer: Self-pay

## 2022-03-06 VITALS — BP 128/84 | HR 93 | Temp 98.3°F | Wt 386.4 lb

## 2022-03-06 DIAGNOSIS — E785 Hyperlipidemia, unspecified: Secondary | ICD-10-CM | POA: Diagnosis not present

## 2022-03-06 DIAGNOSIS — R030 Elevated blood-pressure reading, without diagnosis of hypertension: Secondary | ICD-10-CM

## 2022-03-06 DIAGNOSIS — Z6841 Body Mass Index (BMI) 40.0 and over, adult: Secondary | ICD-10-CM | POA: Diagnosis not present

## 2022-03-06 DIAGNOSIS — E1165 Type 2 diabetes mellitus with hyperglycemia: Secondary | ICD-10-CM | POA: Diagnosis not present

## 2022-03-06 DIAGNOSIS — Q8711 Prader-Willi syndrome: Secondary | ICD-10-CM | POA: Diagnosis not present

## 2022-03-06 DIAGNOSIS — E7800 Pure hypercholesterolemia, unspecified: Secondary | ICD-10-CM

## 2022-03-06 DIAGNOSIS — E1169 Type 2 diabetes mellitus with other specified complication: Secondary | ICD-10-CM | POA: Diagnosis not present

## 2022-03-06 DIAGNOSIS — Z794 Long term (current) use of insulin: Secondary | ICD-10-CM | POA: Diagnosis not present

## 2022-03-06 DIAGNOSIS — F424 Excoriation (skin-picking) disorder: Secondary | ICD-10-CM

## 2022-03-06 LAB — POCT URINALYSIS DIP (CLINITEK)
Bilirubin, UA: NEGATIVE
Blood, UA: NEGATIVE
Glucose, UA: NEGATIVE mg/dL
Ketones, POC UA: NEGATIVE mg/dL
Leukocytes, UA: NEGATIVE
Nitrite, UA: NEGATIVE
POC PROTEIN,UA: NEGATIVE
Spec Grav, UA: <=1.005 — AB (ref 1.010–1.025)
Urobilinogen, UA: 0.2 E.U./dL
pH, UA: 6 (ref 5.0–8.0)

## 2022-03-06 MED ORDER — SERTRALINE HCL 100 MG PO TABS: ORAL_TABLET | ORAL | 3 refills | Status: DC

## 2022-03-06 MED ORDER — TIRZEPATIDE 15 MG/0.5ML ~~LOC~~ SOAJ: 15.0000 mg | SUBCUTANEOUS | 3 refills | Status: DC

## 2022-03-06 MED ORDER — LANTUS SOLOSTAR 100 UNIT/ML ~~LOC~~ SOPN: 30.0000 [IU] | PEN_INJECTOR | Freq: Every day | SUBCUTANEOUS | 6 refills | Status: DC

## 2022-03-06 MED ORDER — BD PEN NEEDLE MINI U/F 31G X 5 MM MISC: 3 refills | Status: DC

## 2022-03-06 NOTE — Assessment & Plan Note (Signed)
BMI 75.46 in office today. I am hopeful that plastic surgery will have options that may be of benefit to help with weight reduction and improved overall management of chronic conditions. Unfortunately, she still has intermittent access to food and her inability to stop eating will lead to chronic overeating unless food is made completely inaccessible. I am hopeful with some weight loss she will be able to walk more and we can work on improved weight management. Will follow.

## 2022-03-06 NOTE — Progress Notes (Signed)
[name], DNP, AGNP-c [facility]  [address] [place], [place] [address] [phone]  ESTABLISHED PATIENT- Chronic Health and/or Follow-Up Visit  Blood pressure 128/84, pulse 93, temperature 98.3 F (36.8 C), weight (!) 386 lb 6.4 oz (175.3 kg).    [name] is a 21 y.o. year old female presenting today for evaluation and management of the following:  Diabetes Mellitus: Patient presents for follow up of diabetes. Symptoms: increase appetite, polydipsia, and polyuria. Symptoms have waxed and waned but are better overall. Patient denies vomitting and weight loss.  Evaluation to date has been included: fasting blood sugar, fasting lipid panel, hemoglobin A1C, and microalbuminuria.  Home sugars: 110-130. Treatment to date:  Insulin, metformin, mounjaro . Working to drink water only and not getting anything to drink after 6pm. She is wetting the bed, but this is chronic for her.  Her blood sugars at home are much better controlled on Mounjaro 15mg  and Lantus.  Prader  Skin Picking It has slowed down, but is still present. She typically picks when she does not get her way or does not have control of the situation. No new or open wounds at this time. Working to keep close monitoring and nails trimmed.   Weight Appetite difficult to control. Still getting food from the kitchen when family is not looking. Has an appt on 12/26 with plastic surgeon to help discuss weight loss measures that may help with her weight management. Her dad is hoping that this would help so that she can have surgery on her knee and eventually help with increasing her exercise and activity levels.   All ROS negative with exception of what is listed above.   PHYSICAL EXAM Physical Exam Vitals and nursing note reviewed.  Constitutional:      General: She is not in acute distress.    Appearance: Normal appearance. She is obese.  HENT:     Head: Normocephalic.  Eyes:      Extraocular Movements: Extraocular movements intact.     Conjunctiva/sclera: Conjunctivae normal.     Pupils: Pupils are equal, round, and reactive to light.  Neck:     Vascular: No carotid bruit.  Cardiovascular:     Rate and Rhythm: Normal rate and regular rhythm.     Pulses: Normal pulses.     Heart sounds: Normal heart sounds. No murmur heard. Pulmonary:     Effort: Pulmonary effort is normal.     Breath sounds: Normal breath sounds. No wheezing.  Abdominal:     General: Bowel sounds are normal. There is no distension.     Palpations: Abdomen is soft.     Tenderness: There is no abdominal tenderness. There is no guarding.  Musculoskeletal:        General: Normal range of motion.     Cervical back: Normal range of motion and neck supple.     Right lower leg: No edema.     Left lower leg: No edema.  Skin:    General: Skin is warm and dry.     Capillary Refill: Capillary refill takes less than 2 seconds.  Neurological:     Mental Status: She is alert. Mental status is at baseline.  Psychiatric:        Mood and Affect: Mood normal.        Behavior: Behavior normal.        Thought Content: Thought content normal.        Judgment: Judgment normal.     PLAN  Problem List Items Addressed This Visit     Prader-Willi syndrome - Primary    Chronic concerns with appetite, weight, and skin picking. Some of these have improved with medication management. She is tolerating sertraline well with no SE. At this time, recommend continuation. Recommend keeping food locked and out of reach to prevent overeating, daily walking to help with weight management, monitor skin for signs of infection or wounds. Will f/u in 4 months.       Relevant Medications   sertraline (ZOLOFT) 100 MG tablet   insulin glargine (LANTUS SOLOSTAR) 100 UNIT/ML Solostar Pen   Other Relevant Orders   CBC with Differential/Platelet   Comprehensive metabolic panel   Hemoglobin A1c   Lipid panel   TSH   T4, free    Compulsive skin picking    Chronic. Improved with sertraline. No open wounds. Will continue to monitor.       Relevant Medications   sertraline (ZOLOFT) 100 MG tablet   Type 2 diabetes mellitus with hyperglycemia, with long-term current use of insulin (HCC)    Home fasting BG look very good. I am hopeful that this will be a good indicator of well controlled A1c. She is still struggling with nocturia, however, this is likely due to developmental delay as opposed to BG. Will monitor A1c today. Continue mounjaro 15mg  and lantus 30u daily.       Relevant Medications   tirzepatide (MOUNJARO) 15 MG/0.5ML Pen   insulin glargine (LANTUS SOLOSTAR) 100 UNIT/ML Solostar Pen   B-D UF III MINI PEN NEEDLES 31G X 5 MM MISC   Other Relevant Orders   CBC with Differential/Platelet   Comprehensive metabolic panel   Hemoglobin A1c   Lipid panel   TSH   T4, free   POCT URINALYSIS DIP (CLINITEK) (Completed)   BMI 70 and over, adult (HCC)    BMI 75.46 in office today. I am hopeful that plastic surgery will have options that may be of benefit to help with weight reduction and improved overall management of chronic conditions. Unfortunately, she still has intermittent access to food and her inability to stop eating will lead to chronic overeating unless food is made completely inaccessible. I am hopeful with some weight loss she will be able to walk more and we can work on improved weight management. Will follow.       Relevant Medications   tirzepatide (MOUNJARO) 15 MG/0.5ML Pen   Other Relevant Orders   CBC with Differential/Platelet   Comprehensive metabolic panel   Hemoglobin A1c   Lipid panel   TSH   T4, free   Elevated BP without diagnosis of hypertension    Goal BP <130/85. At this time she is within goal. No medications at this time. I am hopeful that with weight management, her BP will naturally come down. Continue to monitor.       Relevant Medications   tirzepatide (MOUNJARO) 15 MG/0.5ML  Pen   Other Relevant Orders   CBC with Differential/Platelet   Comprehensive metabolic panel   Hemoglobin A1c   Lipid panel   TSH   T4, free   Elevated cholesterol    Repeat labs today for monitoring. Diet and exercise recommendations provided.       Relevant Medications   tirzepatide (MOUNJARO) 15 MG/0.5ML Pen   Other Relevant Orders   CBC with Differential/Platelet   Comprehensive metabolic panel   Hemoglobin A1c   Lipid panel   TSH   T4, free    Return in  about 4 months (around 07/06/2022) for Diabetes.   [name], DNP, AGNP-c 03/06/2022 10:05 AM

## 2022-03-06 NOTE — Assessment & Plan Note (Signed)
Chronic. Improved with sertraline. No open wounds. Will continue to monitor.

## 2022-03-06 NOTE — Assessment & Plan Note (Signed)
Goal BP <130/85. At this time she is within goal. No medications at this time. I am hopeful that with weight management, her BP will naturally come down. Continue to monitor.

## 2022-03-06 NOTE — Assessment & Plan Note (Signed)
Chronic concerns with appetite, weight, and skin picking. Some of these have improved with medication management. She is tolerating sertraline well with no SE. At this time, recommend continuation. Recommend keeping food locked and out of reach to prevent overeating, daily walking to help with weight management, monitor skin for signs of infection or wounds. Will f/u in 4 months.

## 2022-03-06 NOTE — Patient Instructions (Addendum)
I want to keep the mounjaro on board for her. Her blood sugars are looking great!!  Let me know how the visit with plastic surgery goes. I am anxious to see what they say and what options are available for her.   I have sent refills to the [facility] for you. Let me know if you have any issues.

## 2022-03-06 NOTE — Assessment & Plan Note (Signed)
Home fasting BG look very good. I am hopeful that this will be a good indicator of well controlled A1c. She is still struggling with nocturia, however, this is likely due to developmental delay as opposed to BG. Will monitor A1c today. Continue mounjaro 15mg  and lantus 30u daily.

## 2022-03-06 NOTE — Assessment & Plan Note (Signed)
Repeat labs today for monitoring. Diet and exercise recommendations provided.

## 2022-03-07 LAB — CBC WITH DIFFERENTIAL/PLATELET
Basophils Absolute: 0 10*3/uL (ref 0.0–0.2)
Basos: 1 %
EOS (ABSOLUTE): 0.2 10*3/uL (ref 0.0–0.4)
Eos: 2 %
Hematocrit: 37.2 % (ref 34.0–46.6)
Hemoglobin: 12.3 g/dL (ref 11.1–15.9)
Immature Grans (Abs): 0 10*3/uL (ref 0.0–0.1)
Immature Granulocytes: 0 %
Lymphocytes Absolute: 2.6 10*3/uL (ref 0.7–3.1)
Lymphs: 34 %
MCH: 26.6 pg (ref 26.6–33.0)
MCHC: 33.1 g/dL (ref 31.5–35.7)
MCV: 81 fL (ref 79–97)
Monocytes Absolute: 0.6 10*3/uL (ref 0.1–0.9)
Monocytes: 8 %
Neutrophils Absolute: 4.1 10*3/uL (ref 1.4–7.0)
Neutrophils: 55 %
Platelets: 334 10*3/uL (ref 150–450)
RBC: 4.62 x10E6/uL (ref 3.77–5.28)
RDW: 14.7 % (ref 11.7–15.4)
WBC: 7.5 10*3/uL (ref 3.4–10.8)

## 2022-03-07 LAB — COMPREHENSIVE METABOLIC PANEL
ALT: 25 IU/L (ref 0–32)
AST: 20 IU/L (ref 0–40)
Albumin/Globulin Ratio: 1.1 — ABNORMAL LOW (ref 1.2–2.2)
Albumin: 3.9 g/dL — ABNORMAL LOW (ref 4.0–5.0)
Alkaline Phosphatase: 80 IU/L (ref 44–121)
BUN/Creatinine Ratio: 18 (ref 9–23)
BUN: 13 mg/dL (ref 6–20)
Bilirubin Total: 0.2 mg/dL (ref 0.0–1.2)
CO2: 23 mmol/L (ref 20–29)
Calcium: 9 mg/dL (ref 8.7–10.2)
Chloride: 103 mmol/L (ref 96–106)
Creatinine, Ser: 0.71 mg/dL (ref 0.57–1.00)
Globulin, Total: 3.4 g/dL (ref 1.5–4.5)
Glucose: 129 mg/dL — ABNORMAL HIGH (ref 70–99)
Potassium: 4.6 mmol/L (ref 3.5–5.2)
Sodium: 138 mmol/L (ref 134–144)
Total Protein: 7.3 g/dL (ref 6.0–8.5)
eGFR: 123 mL/min/{1.73_m2} (ref >59)

## 2022-03-07 LAB — TSH: TSH: 3.1 u[IU]/mL (ref 0.450–4.500)

## 2022-03-07 LAB — HEMOGLOBIN A1C
Est. average glucose Bld gHb Est-mCnc: 154 mg/dL
Hgb A1c MFr Bld: 7 % — ABNORMAL HIGH (ref 4.8–5.6)

## 2022-03-07 LAB — LIPID PANEL
Chol/HDL Ratio: 4.7 ratio — ABNORMAL HIGH (ref 0.0–4.4)
Cholesterol, Total: 227 mg/dL — ABNORMAL HIGH (ref 100–199)
HDL: 48 mg/dL (ref >39)
LDL Chol Calc (NIH): 156 mg/dL — ABNORMAL HIGH (ref 0–99)
Triglycerides: 126 mg/dL (ref 0–149)
VLDL Cholesterol Cal: 23 mg/dL (ref 5–40)

## 2022-03-07 LAB — T4, FREE: Free T4: 1.01 ng/dL (ref 0.82–1.77)

## 2022-03-07 MED ORDER — ATORVASTATIN CALCIUM 20 MG PO TABS: 20.0000 mg | ORAL_TABLET | Freq: Every day | ORAL | 3 refills | Status: DC

## 2022-03-07 NOTE — Addendum Note (Signed)
Addended by: Taquita Demby, Huntley Dec E on: 03/07/2022 08:09 PM   Modules accepted: Orders

## 2022-03-17 ENCOUNTER — Ambulatory Visit (HOSPITAL_BASED_OUTPATIENT_CLINIC_OR_DEPARTMENT_OTHER): Payer: Medicare Other | Admitting: Nurse Practitioner

## 2022-03-21 ENCOUNTER — Encounter: Payer: Self-pay | Admitting: Plastic Surgery

## 2022-03-21 ENCOUNTER — Ambulatory Visit (INDEPENDENT_AMBULATORY_CARE_PROVIDER_SITE_OTHER): Payer: Medicare Other | Admitting: Plastic Surgery

## 2022-03-21 VITALS — BP 135/91 | HR 98 | Ht 60.0 in | Wt 380.0 lb

## 2022-03-21 DIAGNOSIS — Z713 Dietary counseling and surveillance: Secondary | ICD-10-CM | POA: Diagnosis not present

## 2022-03-21 DIAGNOSIS — Z6841 Body Mass Index (BMI) 40.0 and over, adult: Secondary | ICD-10-CM | POA: Diagnosis not present

## 2022-03-21 NOTE — Progress Notes (Signed)
[name] is a 21 year old female who accompanies her father today.  Both she and her father are here to discuss options for weight loss.  I discussed extensively with her father the fact that liposuction played no role in significant weight loss.  I also discussed there are options including diet and exercise, bariatric surgery, the new class of weight loss drugs which may be options for weight loss.  They were not interested in these options at this time.  No follow-up is arranged.

## 2022-04-11 ENCOUNTER — Other Ambulatory Visit (HOSPITAL_BASED_OUTPATIENT_CLINIC_OR_DEPARTMENT_OTHER): Payer: Self-pay

## 2022-05-12 ENCOUNTER — Emergency Department (HOSPITAL_COMMUNITY)
Admission: EM | Admit: 2022-05-12 | Discharge: 2022-05-13 | Disposition: A | Discharge status: Home / Self Care | Payer: 59 | Attending: Emergency Medicine | Admitting: Emergency Medicine

## 2022-05-12 ENCOUNTER — Encounter (HOSPITAL_COMMUNITY): Payer: Self-pay | Admitting: Emergency Medicine

## 2022-05-12 ENCOUNTER — Other Ambulatory Visit: Payer: Self-pay

## 2022-05-12 ENCOUNTER — Encounter: Payer: Self-pay | Admitting: Nurse Practitioner

## 2022-05-12 DIAGNOSIS — E119 Type 2 diabetes mellitus without complications: Secondary | ICD-10-CM | POA: Diagnosis not present

## 2022-05-12 DIAGNOSIS — Z91040 Latex allergy status: Secondary | ICD-10-CM | POA: Insufficient documentation

## 2022-05-12 DIAGNOSIS — Z794 Long term (current) use of insulin: Secondary | ICD-10-CM | POA: Diagnosis not present

## 2022-05-12 DIAGNOSIS — L03115 Cellulitis of right lower limb: Secondary | ICD-10-CM | POA: Insufficient documentation

## 2022-05-12 DIAGNOSIS — R590 Localized enlarged lymph nodes: Secondary | ICD-10-CM | POA: Diagnosis not present

## 2022-05-12 DIAGNOSIS — M25461 Effusion, right knee: Secondary | ICD-10-CM | POA: Diagnosis not present

## 2022-05-12 DIAGNOSIS — S71101A Unspecified open wound, right thigh, initial encounter: Secondary | ICD-10-CM | POA: Diagnosis not present

## 2022-05-12 DIAGNOSIS — L02415 Cutaneous abscess of right lower limb: Secondary | ICD-10-CM | POA: Diagnosis not present

## 2022-05-12 LAB — CBC
HCT: 37.8 % (ref 36.0–46.0)
Hemoglobin: 11.5 g/dL — ABNORMAL LOW (ref 12.0–15.0)
MCH: 26 pg (ref 26.0–34.0)
MCHC: 30.4 g/dL (ref 30.0–36.0)
MCV: 85.5 fL (ref 80.0–100.0)
Platelets: 328 10*3/uL (ref 150–400)
RBC: 4.42 MIL/uL (ref 3.87–5.11)
RDW: 15.4 % (ref 11.5–15.5)
WBC: 12.3 10*3/uL — ABNORMAL HIGH (ref 4.0–10.5)
nRBC: 0 % (ref 0.0–0.2)

## 2022-05-12 LAB — BASIC METABOLIC PANEL
Anion gap: 10 (ref 5–15)
BUN: 14 mg/dL (ref 6–20)
CO2: 27 mmol/L (ref 22–32)
Calcium: 8.9 mg/dL (ref 8.9–10.3)
Chloride: 100 mmol/L (ref 98–111)
Creatinine, Ser: 0.85 mg/dL (ref 0.44–1.00)
GFR, Estimated: >60 mL/min (ref >60)
Glucose, Bld: 126 mg/dL — ABNORMAL HIGH (ref 70–99)
Potassium: 3.8 mmol/L (ref 3.5–5.1)
Sodium: 137 mmol/L (ref 135–145)

## 2022-05-12 NOTE — ED Triage Notes (Signed)
Pt BIB via POV with Father Shirlean Mylar, who states concern for wound to right upper thigh.  Per Father, pt has been picking at wound opening the area as of Wednesday. Wound has clear discharge and foul odor. Father has been cleaning wound with Mupirocin ointment and hydrogen peroxide. HX T2DM.

## 2022-05-12 NOTE — ED Provider Triage Note (Signed)
Emergency Medicine Provider Triage Evaluation Note  Paula Compton , a 22 y.o. female  was evaluated in triage.  Pt here for wound check.  Patient has multiple wounds on her body's due to self picking.  She has a deep wound on the right thigh that has been oozing with foul odor that need to be evaluated.  Dad said he has put mupirocin topical ointment on her wound.  History of Prader-Willi disease and diabetes.  Review of Systems  Positive: As above Negative: As above  Physical Exam  BP (!) 130/103   Pulse (!) 107   Temp 99 F (37.2 C) (Oral)   Resp 19   Ht 5' (1.524 m)   Wt (!) 172.4 kg   SpO2 99%   BMI 74.21 kg/m  Gen:   Awake, no distress   Resp:  Normal effort  MSK:   Moves extremities without difficulty  Other:    Medical Decision Making  Medically screening exam initiated at 9:12 PM.  Appropriate orders placed.  KARMEN KLEIN was informed that the remainder of the evaluation will be completed by another provider, this initial triage assessment does not replace that evaluation, and the importance of remaining in the ED until their evaluation is complete.    Rex Kras, [place] 05/13/22 [phone]

## 2022-05-13 ENCOUNTER — Emergency Department (HOSPITAL_COMMUNITY): Payer: 59

## 2022-05-13 ENCOUNTER — Encounter (HOSPITAL_COMMUNITY): Payer: Self-pay | Admitting: Emergency Medicine

## 2022-05-13 ENCOUNTER — Encounter: Payer: Self-pay | Admitting: Nurse Practitioner

## 2022-05-13 DIAGNOSIS — R590 Localized enlarged lymph nodes: Secondary | ICD-10-CM | POA: Diagnosis not present

## 2022-05-13 DIAGNOSIS — M25461 Effusion, right knee: Secondary | ICD-10-CM | POA: Diagnosis not present

## 2022-05-13 DIAGNOSIS — L02415 Cutaneous abscess of right lower limb: Secondary | ICD-10-CM | POA: Diagnosis not present

## 2022-05-13 DIAGNOSIS — S71101A Unspecified open wound, right thigh, initial encounter: Secondary | ICD-10-CM | POA: Diagnosis not present

## 2022-05-13 LAB — HCG, SERUM, QUALITATIVE: Preg, Serum: NEGATIVE

## 2022-05-13 MED ORDER — LEVOFLOXACIN 750 MG PO TABS
750.0000 mg | ORAL_TABLET | Freq: Once | ORAL | Status: AC
  Administered 2022-05-13: 750 mg via ORAL
  Filled 2022-05-13: qty 1

## 2022-05-13 MED ORDER — LEVOFLOXACIN 750 MG PO TABS: ORAL_TABLET | ORAL | 0 refills | Status: DC

## 2022-05-13 MED ORDER — FLUCONAZOLE 150 MG PO TABS: ORAL_TABLET | ORAL | 0 refills | Status: DC

## 2022-05-13 MED ORDER — LEVOFLOXACIN 750 MG PO TABS: 750.0000 mg | ORAL_TABLET | Freq: Once | ORAL | Status: DC

## 2022-05-13 MED ORDER — IOHEXOL 350 MG/ML SOLN
100.0000 mL | Freq: Once | INTRAVENOUS | Status: AC | PRN
  Administered 2022-05-13: 100 mL via INTRAVENOUS

## 2022-05-13 NOTE — ED Notes (Signed)
Wound irrigated with 324m NS per Dr order, pt tolerated well

## 2022-05-13 NOTE — ED Triage Notes (Signed)
Unable to visualize wound in triage as pt has difficulty ambulating.

## 2022-05-13 NOTE — ED Notes (Signed)
Wound care provided prior to discharge. Pt wound on right leg packed with 1/4 inch idoform, covered with zeroform and telfa. Guaze applied and wrapped with an ace bandage to keep dressing in place. Discussed with parent importance of daily dressing changes with clean technique and follow up with wound care clinic asap. Parent instructed on how to perform dressing changes, verbalized understanding. Abx given prior to discharge and prescription reviewed. No further questions by parent.

## 2022-05-13 NOTE — ED Provider Notes (Signed)
[facility] DEPT Provider Note: Georgena Spurling, MD, FACEP  CSN: [id number] MRN: [id number] ARRIVAL: 05/12/22 at 2044 ROOM: WA09/WA09   CHIEF COMPLAINT  Wound Check   HISTORY OF PRESENT ILLNESS  05/13/22 12:29 AM [name] is a 22 y.o. female with Prader-Willi syndrome and diabetes.  She has a habit of "picking" at her skin causing superficial wounds.  She is here with a wound to her right thigh.  She has been picking at that wound for the past 3 days.  The wound has become deep and is foul-smelling.  It has had a foul-smelling discharge as well.  Her father has been cleaning the wound with mupirocin ointment and hydrogen peroxide.  The patient states the wound is painful but does not appear to be in significant discomfort.   Past Medical History:  Diagnosis Date   Body mass index (BMI) of 70 or greater in adult [facility]) 07/20/2020   DM (diabetes mellitus), type 2, uncontrolled with complications    Open wound of skin 07/20/2020   Pain in joint involving pelvic region and thigh, bilateral 07/20/2020   Prader-Willi syndrome    Scoliosis    Tibia vara of left lower extremity     Past Surgical History:  Procedure Laterality Date   KNEE SURGERY     MYRINGOTOMY Bilateral     Family History  Problem Relation Age of Onset   Hypertension Father    Heart disease Maternal Grandmother    Cancer Paternal Grandfather     Social History   Tobacco Use   Smoking status: Never   Smokeless tobacco: Never  Vaping Use   Vaping Use: Never used  Substance Use Topics   Alcohol use: No   Drug use: Never    Prior to Admission medications   Medication Sig Start Date End Date Taking? Authorizing Provider  atorvastatin (LIPITOR) 20 MG tablet Take 1 tablet (20 mg total) by mouth at bedtime. 03/07/22  Yes Early, Coralee Pesa, NP  fluconazole (DIFLUCAN) 150 MG tablet Take 1 tablet as needed for vaginal yeast infection.  May repeat in 3 days if symptoms persist. 05/13/22  Yes Mirielle Byrum, MD   insulin glargine (LANTUS SOLOSTAR) 100 UNIT/ML Solostar Pen Inject 30 Units into the skin daily. 03/06/22  Yes Early, Coralee Pesa, NP  levofloxacin (LEVAQUIN) 750 MG tablet Take 1 tablet daily starting 05/14/2022. 05/13/22  Yes Shannel Zahm, MD  mometasone (ELOCON) 0.1 % cream Apply to areas of itching twice a day. Patient taking differently: Apply 1 Application topically daily as needed (itching). 11/15/21  Yes Early, Coralee Pesa, NP  mupirocin ointment (BACTROBAN) 2 % APPLY TOPICALLY TO THE AFFECTED AREA 2 TO 3 TIMES DAILY AS DIRECTED 11/15/21  Yes Early, Coralee Pesa, NP  sertraline (ZOLOFT) 100 MG tablet Take 2 tablets (259m) by mouth at bedtime for anxiety, picking, and compulsive disorders. 03/06/22  Yes Early, SCoralee Pesa NP  Vitamin D, Ergocalciferol, (DRISDOL) 1.25 MG (50000 UNIT) CAPS capsule Take 1 capsule (50,000 Units total) by mouth every 7 (seven) days. 11/18/21  Yes Early, SCoralee Pesa NP  Accu-Chek FastClix Lancets MISC SMARTSIG:Topical 6 Times Daily 11/07/20   [provider]  ACCU-CHEK GUIDE test strip USE TO CHECK BLOOD SUGAR UP TO 6 TIMES DAILY AS DIRECTED 10/29/20   [provider]  AMBULATORY NON FORMULARY MEDICATION Rolling walker with seat and hand breaks for ambulatory assistance based on insurance coverage and patient fit. 04/13/21   EOrma Render NP  B-D UF III MINI PEN  NEEDLES 31G X 5 MM MISC SMARTSIG:Injection 03/06/22   Early, Coralee Pesa, NP  tirzepatide [facility]) 15 MG/0.5ML Pen Inject 15 mg into the skin once a week. 03/06/22   Orma Render, NP    Allergies Latex   REVIEW OF SYSTEMS  Negative except as noted here or in the History of Present Illness.   PHYSICAL EXAMINATION  Initial Vital Signs Blood pressure (!) 130/103, pulse (!) 107, temperature 99 F (37.2 C), temperature source Oral, resp. rate 19, height 5' (1.524 m), weight (!) 172.4 kg, SpO2 99 %.  Examination General: Well-developed, high BMI female in no acute distress; appearance consistent with age of  record HENT: normocephalic; atraumatic Eyes: Normal appearance Neck: supple Heart: regular rate and rhythm Lungs: clear to auscultation bilaterally Abdomen: soft; nondistended; nontender Extremities: No deformity; full range of motion; pulses normal Neurologic: Awake, alert; motor function intact in all extremities and symmetric; no facial droop Skin: Warm and dry; multiple hypopigmented wounds of various stages of healing; deep wound (approximately 1.5 inches) of right lateral thigh with foul-smelling, gray exudate (sent for culture) Psychiatric: Normal mood and affect   RESULTS  Summary of this visit's results, reviewed and interpreted by myself:   EKG Interpretation  Date/Time:    Ventricular Rate:    PR Interval:    QRS Duration:   QT Interval:    QTC Calculation:   R Axis:     Text Interpretation:         Laboratory Studies: Results for orders placed or performed during the hospital encounter of 05/12/22 (from the past 24 hour(s))  CBC     Status: Abnormal   Collection Time: 05/12/22  9:39 PM  Result Value Ref Range   WBC 12.3 (H) 4.0 - 10.5 K/uL   RBC 4.42 3.87 - 5.11 MIL/uL   Hemoglobin 11.5 (L) 12.0 - 15.0 g/dL   HCT 37.8 36.0 - 46.0 %   MCV 85.5 80.0 - 100.0 fL   MCH 26.0 26.0 - 34.0 pg   MCHC 30.4 30.0 - 36.0 g/dL   RDW 15.4 11.5 - 15.5 %   Platelets 328 150 - 400 K/uL   nRBC 0.0 0.0 - 0.2 %  Basic metabolic panel     Status: Abnormal   Collection Time: 05/12/22  9:39 PM  Result Value Ref Range   Sodium 137 135 - 145 mmol/L   Potassium 3.8 3.5 - 5.1 mmol/L   Chloride 100 98 - 111 mmol/L   CO2 27 22 - 32 mmol/L   Glucose, Bld 126 (H) 70 - 99 mg/dL   BUN 14 6 - 20 mg/dL   Creatinine, Ser 0.85 0.44 - 1.00 mg/dL   Calcium 8.9 8.9 - 10.3 mg/dL   GFR, Estimated >60 >60 mL/min   Anion gap 10 5 - 15  hCG, serum, qualitative     Status: None   Collection Time: 05/13/22  2:08 AM  Result Value Ref Range   Preg, Serum NEGATIVE NEGATIVE   Imaging  Studies: CT EXTREMITY LOWER RIGHT W CONTRAST  Result Date: 05/13/2022 CLINICAL DATA:  Soft tissue infection suspected in right thigh with wound. EXAM: CT OF THE LOWER RIGHT EXTREMITY WITH CONTRAST TECHNIQUE: Multidetector CT imaging of the lower right extremity was performed according to the standard protocol following intravenous contrast administration. RADIATION DOSE REDUCTION: This exam was performed according to the departmental dose-optimization program which includes automated exposure control, adjustment of the mA and/or kV according to patient size and/or use of iterative reconstruction technique.  CONTRAST:  117m OMNIPAQUE IOHEXOL 350 MG/ML SOLN COMPARISON:  None Available. FINDINGS: Bones/Joint/Cartilage No acute fracture or dislocation. No periosteal elevation or bony erosion. Small joint effusion is noted at the knee. Ligaments Suboptimally assessed by CT. Muscles and Tendons No intramuscular edema, hematoma, or abscess. Soft tissues Subcutaneous fat stranding and skin thickening is noted in the anterolateral thigh. Two complex collections containing debris are present in the subcutaneous soft tissues measuring 4.5 x 1.8 x 2.1 cm and 1.1 x 1.1 x 2.7 cm. Enlarged lymph nodes are present in the medial right thigh and along the iliac chain on the left, likely reactive. IMPRESSION: Subcutaneous fat stranding and skin thickening in the anteromedial thigh. Two collections containing air and debris are noted in the region, possible developing abscesses. No intramuscular involvement. Electronically Signed   By: LBrett FairyM.D.   On: 05/13/2022 04:27    ED COURSE and MDM  Nursing notes, initial and subsequent vitals signs, including pulse oximetry, reviewed and interpreted by myself.  Vitals:   05/13/22 0145 05/13/22 0200 05/13/22 0215 05/13/22 0230  BP: 134/74 131/72 (!) 127/90 123/76  Pulse: 89 89 (!) 107 100  Resp:    18  Temp:      TempSrc:      SpO2: 100% 94% 96% 94%  Weight:       Height:       Medications  levofloxacin (LEVAQUIN) tablet 750 mg (has no administration in time range)  iohexol (OMNIPAQUE) 350 MG/ML injection 100 mL (100 mLs Intravenous Contrast Given 05/13/22 0408)   The CT findings are consistent with the cellulitis with draining abscesses.  On physical exam there is a drainage and it has been sent for culture.  The foul smell is reminiscent of Pseudomonas.  The patient does not appear systemically ill or toxic appearing.  I believe she could be treated with outpatient levofloxacin to cover for Pseudomonas and follow-up with her PCP or return if symptoms worsen.  Nursing staff instructed the patient's father on how to care for the wound.   PROCEDURES  Procedures   ED DIAGNOSES     ICD-10-CM   1. Cellulitis and abscess of right lower extremity  L03.115    L02.415          Taeshaun Rames, JJenny Reichmann MD 05/13/22 [phone]

## 2022-05-16 ENCOUNTER — Ambulatory Visit (INDEPENDENT_AMBULATORY_CARE_PROVIDER_SITE_OTHER): Payer: 59 | Admitting: Nurse Practitioner

## 2022-05-16 ENCOUNTER — Encounter: Payer: Self-pay | Admitting: Nurse Practitioner

## 2022-05-16 VITALS — BP 134/82 | HR 105 | Wt 379.0 lb

## 2022-05-16 DIAGNOSIS — Z6841 Body Mass Index (BMI) 40.0 and over, adult: Secondary | ICD-10-CM

## 2022-05-16 DIAGNOSIS — E1165 Type 2 diabetes mellitus with hyperglycemia: Secondary | ICD-10-CM

## 2022-05-16 DIAGNOSIS — Z794 Long term (current) use of insulin: Secondary | ICD-10-CM | POA: Diagnosis not present

## 2022-05-16 DIAGNOSIS — F424 Excoriation (skin-picking) disorder: Secondary | ICD-10-CM | POA: Diagnosis not present

## 2022-05-16 DIAGNOSIS — Z5189 Encounter for other specified aftercare: Secondary | ICD-10-CM | POA: Diagnosis not present

## 2022-05-16 LAB — AEROBIC/ANAEROBIC CULTURE W GRAM STAIN (SURGICAL/DEEP WOUND)

## 2022-05-16 MED ORDER — TRAMADOL HCL 50 MG PO TABS: 50.0000 mg | ORAL_TABLET | Freq: Every evening | ORAL | 0 refills | Status: DC | PRN

## 2022-05-16 MED ORDER — TIRZEPATIDE 12.5 MG/0.5ML ~~LOC~~ SOAJ
12.5000 mg | SUBCUTANEOUS | 1 refills | Status: DC
  Filled 2022-10-23: qty 6, 84d supply, fill #0

## 2022-05-16 NOTE — Assessment & Plan Note (Signed)
At this time diabetes is fairly well controlled with mounjaro. Side effects of loose stools are present. We will reduce the dose to 12.6m a week and see if that helps with the side effects. Given her current wound, recommend tight glycemic control to ensure that healing is optimal.

## 2022-05-16 NOTE — Assessment & Plan Note (Addendum)
[name] presents with a significant wound that has been previously packed and treated at the hospital but is now being managed at home. The wound is deep, with a history of drainage and infection, and has been packed twice daily due to leakage and odor. There is evidence of granulation tissue, indicating healing, but the wound requires ongoing care to prevent infection and promote healing from the inside out. Plan: - Continue wound packing with sterile technique, using appropriate width packing material. - Gradually reduce packing by cutting off an inch of the external portion daily, leaving the remainder inside to aid internal healing. - Clean the wound with Dermal wound cleanser and Betadine for the surrounding skin to prevent infection. - Apply Mupirocin ointment to the wound to promote healing. - Prescribe additional wound care supplies, including ABD pads, paper tape, cloth tape, sterile cotton tip applicators, and Iodoform gauze. - Refer [name] to a wound care center for specialized assessment and management. - Monitor for signs of infection or changes in wound appearance and odor, and advise immediate contact if these occur. - Encourage elevation of the affected limb to reduce swelling and promote circulation. - Prescribe an appropriate analgesic for pain management, considering [name]'s overall health and risk of dependency or side effects. - Administer pain medication at bedtime to ensure comfort and promote restful sleep. - Reassess pain levels regularly and adjust medication as needed.

## 2022-05-16 NOTE — Progress Notes (Signed)
Orma Render, DNP, AGNP-c [place], [place] [address] [phone]  Subjective:   [name] is a 22 y.o. female presents to day for evaluation of: The patient, [name], presents with a significant wound that has been present for over a week, initially managed in the ED and now under home care. The wound is described as draining with a history of yellow and green discharge, indicating possible infection. [name]'s father reports that they first noticed the wound last week when they detected an odor. She was seen in the ED for management. Since then he has provided twice-daily dressing changes due to drainage and odor. They have been using a dermal wound cleanser for cleaning. The wound is self-inflicted from either scratching with subsequent infection or possibly with glass from a broken mirror. 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   [name] is experiencing considerable pain and anxiety, particularly at night, which exacerbates her discomfort. The pain is severe enough to cause distress throughout the day. Her father has been managing the wound with clean technique, including the use of sterilized scissors, tweezers, and surgical Q-tips, followed by cleaning of the instruments with antibacterial soap, peroxide, and boiling water to prevent recurrent infection.  The patient's caregiver has exhausted the supply of wound care materials provided by the hospital, including ABD pads, gauze, and tape, and is in need of additional supplies. The caregiver has improvised with available materials, including the use of Mupirocin ointment, to manage the wound. The wound is deep and is requiring packing with iodoform gauze.   [name]'s dad expresses concern about her tendency to pick at the wound, which is attributed to anxiety. [name] is currently taking an antibiotic course prescribed for  10 days and is on Sertraline for anxiety, with the dosage at the maximum allowable limit. He would like to know if there is additional medication that may be helpful for management of her obsessive behaviors.   PMH, Medications, and Allergies reviewed and updated in chart as appropriate.   ROS negative except for what is listed in HPI. Objective:  BP 134/82   Pulse (!) 105   Wt (!) 379 lb (171.9 kg)   BMI 74.02 kg/m  Physical Exam Vitals and nursing note reviewed.  Constitutional:      General: She is not in acute distress.    Appearance: Normal appearance. She is obese.  HENT:     Head: Normocephalic.  Eyes:     Pupils: Pupils are equal, round, and reactive to light.  Cardiovascular:     Rate and Rhythm: Normal rate and regular rhythm.     Pulses: Normal pulses.     Heart sounds: Normal heart sounds.  Pulmonary:     Effort: Pulmonary effort is normal.     Breath sounds: Normal breath sounds.  Skin:    General: Skin is warm and dry.     Capillary Refill: Capillary refill takes less than 2 seconds.     Findings: Wound present.       Neurological:     Mental Status: She is alert. Mental status is at baseline.           Assessment & Plan:   Problem List Items Addressed This Visit     Compulsive skin picking    [name] is exhibiting signs of anxiety, which may be contributing to her picking at the wound. This behavior can impede healing and  increase the risk of infection. We have tried sertraline to help with behaviors, however, there has not been much change noted.  Plan: - Continue current dose of Sertraline for anxiety management at this time while provider explores additional behavioral or pharmacological interventions to address anxiety and prevent picking behavior. - Consider a referral to a mental health specialist for further evaluation and treatment of anxiety if able to find specialist that can help with her specific behavioral concerns.  - Discuss  non-pharmacological strategies to manage anxiety, such as relaxation techniques or distraction methods.      Relevant Orders   AMB referral to wound care center   Type 2 diabetes mellitus with hyperglycemia, with long-term current use of insulin ([place])    At this time diabetes is fairly well controlled with mounjaro. Side effects of loose stools are present. We will reduce the dose to 12.1m a week and see if that helps with the side effects. Given her current wound, recommend tight glycemic control to ensure that healing is optimal.       Relevant Medications   tirzepatide (MOUNJARO) 12.5 MG/0.5ML Pen   Other Relevant Orders   AMB referral to wound care center   Wound check, abscess - Primary    [name] presents with a significant wound that has been previously packed and treated at the hospital but is now being managed at home. The wound is deep, with a history of drainage and infection, and has been packed twice daily due to leakage and odor. There is evidence of granulation tissue, indicating healing, but the wound requires ongoing care to prevent infection and promote healing from the inside out. Plan: - Continue wound packing with sterile technique, using appropriate width packing material. - Gradually reduce packing by cutting off an inch of the external portion daily, leaving the remainder inside to aid internal healing. - Clean the wound with Dermal wound cleanser and Betadine for the surrounding skin to prevent infection. - Apply Mupirocin ointment to the wound to promote healing. - Prescribe additional wound care supplies, including ABD pads, paper tape, cloth tape, sterile cotton tip applicators, and Iodoform gauze. - Refer [name] to a wound care center for specialized assessment and management. - Monitor for signs of infection or changes in wound appearance and odor, and advise immediate contact if these occur. - Encourage elevation of the affected limb to reduce swelling and  promote circulation. - Prescribe an appropriate analgesic for pain management, considering [name]'s overall health and risk of dependency or side effects. - Administer pain medication at bedtime to ensure comfort and promote restful sleep. - Reassess pain levels regularly and adjust medication as needed.      Relevant Medications   traMADol (ULTRAM) 50 MG tablet   BMI 70 and over, adult ([facility]   Relevant Medications   tirzepatide (MOUNJARO) 12.5 MG/0.5ML Pen    Time: 55 minutes, >50% spent counseling, care coordination, chart review, and documentation.    SOrma Render DNP, AGNP-c 05/16/2022  6:53 PM    History, Medications, Surgery, SDOH, and Family History reviewed and updated as appropriate.

## 2022-05-16 NOTE — Patient Instructions (Signed)
Pull the strip out 1 inch a day until it comes all the away out. If it is still deep, then repack.   Change the dressing twice a day.

## 2022-05-16 NOTE — Assessment & Plan Note (Signed)
[name] is exhibiting signs of anxiety, which may be contributing to her picking at the wound. This behavior can impede healing and increase the risk of infection. We have tried sertraline to help with behaviors, however, there has not been much change noted.  Plan: - Continue current dose of Sertraline for anxiety management at this time while provider explores additional behavioral or pharmacological interventions to address anxiety and prevent picking behavior. - Consider a referral to a mental health specialist for further evaluation and treatment of anxiety if able to find specialist that can help with her specific behavioral concerns.  - Discuss non-pharmacological strategies to manage anxiety, such as relaxation techniques or distraction methods.

## 2022-05-17 ENCOUNTER — Telehealth (HOSPITAL_BASED_OUTPATIENT_CLINIC_OR_DEPARTMENT_OTHER): Payer: Self-pay | Admitting: Emergency Medicine

## 2022-05-17 NOTE — Telephone Encounter (Signed)
Post ED Visit - Positive Culture Follow-up  Culture report reviewed by antimicrobial stewardship pharmacist: [place] [facility] [x]$  Elenor Quinones, Pharm.D. []$  Heide Guile, Pharm.D., BCPS AQ-ID []$  Parks Neptune, Pharm.D., BCPS []$  Alycia Rossetti, Pharm.D., BCPS []$  [place], [place].D., BCPS, AAHIVP []$  Legrand Como, Pharm.D., BCPS, AAHIVP []$  Salome Arnt, PharmD, BCPS []$  Johnnette Gourd, PharmD, BCPS []$  Hughes Better, PharmD, BCPS []$  Leeroy Cha, PharmD []$  Laqueta Linden, PharmD, BCPS []$  Albertina Parr, PharmD  [place] Team []$  Leodis Sias, PharmD []$  Lindell Spar, PharmD []$  Royetta Asal, PharmD []$  Graylin Shiver, Rph []$  Rema Fendt) Glennon Mac, PharmD []$  Arlyn Dunning, PharmD []$  Netta Cedars, PharmD []$  Dia Sitter, PharmD []$  Leone Haven, PharmD []$  Gretta Arab, PharmD []$  Theodis Shove, PharmD []$  Peggyann Juba, PharmD []$  Reuel Boom, PharmD   Positive wound culture Treated with fluconazole and levofloxacin, organism sensitive to the same and no further patient follow-up is required at this time.  Hazle Nordmann 05/17/2022, 8:34 AM

## 2022-05-18 ENCOUNTER — Telehealth: Payer: Self-pay | Admitting: Family Medicine

## 2022-05-18 NOTE — Telephone Encounter (Signed)
PA Forms filled out and faxed for [facility].

## 2022-05-21 ENCOUNTER — Telehealth: Payer: Self-pay | Admitting: Nurse Practitioner

## 2022-05-21 NOTE — Telephone Encounter (Signed)
Halford Chessman approved til 03/27/23, sent mychart message

## 2022-05-22 ENCOUNTER — Other Ambulatory Visit (HOSPITAL_BASED_OUTPATIENT_CLINIC_OR_DEPARTMENT_OTHER): Payer: Self-pay

## 2022-05-23 ENCOUNTER — Encounter (HOSPITAL_BASED_OUTPATIENT_CLINIC_OR_DEPARTMENT_OTHER): Payer: 59 | Attending: Internal Medicine | Admitting: General Surgery

## 2022-05-23 DIAGNOSIS — E1165 Type 2 diabetes mellitus with hyperglycemia: Secondary | ICD-10-CM | POA: Diagnosis not present

## 2022-05-23 DIAGNOSIS — S71101A Unspecified open wound, right thigh, initial encounter: Secondary | ICD-10-CM | POA: Diagnosis not present

## 2022-05-23 DIAGNOSIS — E11622 Type 2 diabetes mellitus with other skin ulcer: Secondary | ICD-10-CM | POA: Diagnosis not present

## 2022-05-23 DIAGNOSIS — Z6841 Body Mass Index (BMI) 40.0 and over, adult: Secondary | ICD-10-CM | POA: Diagnosis not present

## 2022-05-23 DIAGNOSIS — F424 Excoriation (skin-picking) disorder: Secondary | ICD-10-CM | POA: Insufficient documentation

## 2022-05-23 DIAGNOSIS — Q8711 Prader-Willi syndrome: Secondary | ICD-10-CM | POA: Insufficient documentation

## 2022-05-23 DIAGNOSIS — L97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: Secondary | ICD-10-CM | POA: Insufficient documentation

## 2022-05-24 ENCOUNTER — Encounter: Payer: Self-pay | Admitting: Nurse Practitioner

## 2022-05-25 NOTE — Progress Notes (Signed)
[name], [name] ([id number]) [id number] Page 1 of 7 Visit Report for 05/23/2022 Allergy List Details Patient Name: Date of Service: DO [name], [name] 05/23/2022 12:30 PM Medical Record Number: [id number] Patient Account Number: [id number] Date of Birth/Sex: Treating RN: 2000/10/03 (22 y.o. [name] Primary Care [name]: [name] Other Clinician: Referring [name]: Treating [name]/Extender: [name] in Treatment: 0 Allergies Active Allergies gloves, latex Allergy Notes Electronic Signature(s) Signed: 05/24/2022 3:42:50 PM By: Adline Peals Entered By: Adline Peals on 05/23/2022 13:37:10 -------------------------------------------------------------------------------- Arrival Information Details Patient Name: Date of Service: DO Rebekah Sellers, MA [name] 05/23/2022 12:30 PM Medical Record Number: [id number] Patient Account Number: [id number] Date of Birth/Sex: Treating RN: 02/01/2001 (22 y.o. [name] Primary Care [name]: [name] Other Clinician: Referring [name]: Treating Brenee Gajda/Extender: [name] in Treatment: 0 Visit Information Patient Arrived: [name] Time: 12:27 Accompanied By: father Transfer Assistance: Manual Patient Identification Verified: Yes Secondary Verification Process Completed: Yes Patient Requires Transmission-Based Precautions: No Electronic Signature(s) Signed: 05/24/2022 3:42:50 PM By: Adline Peals Entered By: Adline Peals on 05/23/2022 13:37:01 -------------------------------------------------------------------------------- Encounter Discharge Information Details Patient Name: Date of Service: DO Rebekah Sellers, MA [name] 05/23/2022 12:30 PM Medical Record Number: [id number] Patient Account Number: [id number] Date of Birth/Sex: Treating RN: 2000/05/21 (22 y.o. [name] Primary Care Brandley Aldrete:  [name] Other Clinician: Referring [name]: Treating Reynol Black/Extender: [name] in Treatment: 0 Encounter Discharge Information Items Post Procedure Vitals Discharge Condition: Stable Temperature (F): 98 Ambulatory Status: Walker Pulse (bpm): 97 Discharge Destination: Home Respiratory Rate (breaths/min): 18 Transportation: Private Auto Blood Pressure (mmHg): 127/74 Accompanied By: father Schedule Follow-up Appointment: Yes Clinical Summary of Care: Patient [name], [name] ([id number]) [phone].pdf Page 2 of 7 Electronic Signature(s) Signed: 05/24/2022 3:42:50 PM By: Sabas Sous By: Adline Peals on 05/23/2022 13:39:47 -------------------------------------------------------------------------------- Lower Extremity Assessment Details Patient Name: Date of Service: DO [name], [place] [name] 05/23/2022 12:30 PM Medical Record Number: [id number] Patient Account Number: [id number] Date of Birth/Sex: Treating RN: 2000/04/22 (22 y.o. [name] Primary Care [name]: [name] Other Clinician: Referring [name]: Treating [name]/Extender: [name] in Treatment: 0 Electronic Signature(s) Signed: 05/24/2022 3:42:50 PM By: Sabas Sous By: Adline Peals on 05/23/2022 13:37:49 -------------------------------------------------------------------------------- Multi Wound Chart Details Patient Name: Date of Service: DO Rebekah Sellers, MA [name] 05/23/2022 12:30 PM Medical Record Number: [id number] Patient Account Number: [id number] Date of Birth/Sex: Treating RN: 2000/04/10 (22 y.o. F) Primary Care [name]: [name] Other Clinician: Referring [name]: Treating [name]/Extender: [name] in Treatment: 0 Vital Signs Height(in): 60 Capillary Blood Glucose(mg/dl): 142 Weight(lbs): 380 Pulse(bpm): 97 Body Mass  Index(BMI): 74.2 Blood Pressure(mmHg): 127/74 Temperature(F): 98 Respiratory Rate(breaths/min): 18 [1:Photos: No Photos Right, Lateral Upper Leg Wound Location: Trauma Wounding Event: Trauma, Other Primary Etiology: Type II Diabetes Comorbid History: 04/07/2022 Date Acquired: 0 Weeks of Treatment: Open Wound Status: No Wound Recurrence: 2x7x4.9 Measurements L x W  x D (cm) 10.996 A (cm) : rea 53.878 Volume (cm) : Full Thickness Without Exposed Classification: Support Structures Medium Exudate A mount: Serosanguineous Exudate Type: red, brown Exudate Color: Distinct, outline attached Wound Margin: Large  (67-100%) Granulation A mount: Red, Pink Granulation Quality: Small (1-33%) Necrotic A mount: Eschar, Adherent Slough Necrotic Tissue: Fat Layer (Subcutaneous Tissue): Yes N/A Exposed Structures: Fascia: No Tendon: No Muscle: No Joint: No Bone: No Small  (1-33%) Epithelialization: Debridement - Excisional Debridement: Pre-procedure  Verification/Time Out 13:11 Taken: Lidocaine 4% Topical Solution Pain Control: Subcutaneous, Slough Tissue Debrided: Skin/Subcutaneous Tissue Level: 14 Debridement A (sq cm):  rea] [N/A:N/A N/A N/A N/A N/A N/A N/A N/A N/A N/A N/A N/A N/A N/A N/A N/A N/A N/A N/A N/A N/A N/A N/A N/A N/A N/A N/A N/A] CALIXTO, [name] ([id number]) [1:Curette Instrument: Minimum Bleeding: Pressure Hemostasis A chieved: Procedure was tolerated well Debridement Treatment Response: 2x7x4.9 Post Debridement Measurements L x W x D (cm) 53.878 Post Debridement Volume: (cm) Scarring: Yes Periwound Skin  Texture: Dry/Scaly: Yes Periwound Skin Moisture: No Abnormalities Noted Periwound Skin Color: No Abnormality Temperature: Debridement Procedures Performed:] [N/A:N/A N/A N/A N/A N/A N/A N/A N/A N/A N/A N/A] Treatment Notes Wound #1 (Upper Leg) Wound Laterality: Right, Lateral Cleanser Soap and Water Discharge Instruction: May shower and wash wound with dial antibacterial soap and water prior to  dressing change. Wound Cleanser Discharge Instruction: Cleanse the wound with wound cleanser prior to applying a clean dressing using gauze sponges, not tissue or cotton balls. Peri-Wound Care Skin Prep Discharge Instruction: Use skin prep as directed Sween Lotion (Moisturizing lotion) Discharge Instruction: Apply moisturizing lotion as directed Topical Primary Dressing Iodoform packing strip 1/2 (in) Discharge Instruction: Lightly pack as instructed Sorbalgon AG Dressing 2x2 (in/in) Discharge Instruction: Apply to wound bed as instructed Secondary Dressing ABD Pad, 5x9 Discharge Instruction: Apply over primary dressing as directed. Zetuvit Plus Silicone Border Dressing 4x4 (in/in) Discharge Instruction: Apply silicone border over primary dressing as directed. Secured With 11M Medipore H Soft Cloth Surgical T ape, 4 x 10 (in/yd) Discharge Instruction: Secure with tape as directed. Compression Wrap Compression Stockings Add-Ons Electronic Signature(s) Signed: 05/24/2022 3:42:50 PM By: Adline Peals Previous Signature: 05/23/2022 1:34:13 PM Version By: Fredirick Maudlin MD FACS Entered By: Adline Peals on 05/23/2022 13:40:54 -------------------------------------------------------------------------------- Multi-Disciplinary Care Plan Details Patient Name: Date of Service: DO Rebekah Sellers, MA [name] 05/23/2022 12:30 PM Medical Record Number: [id number] Patient Account Number: [id number] Date of Birth/Sex: Treating RN: [birth year] (22 y.o. [name] Primary Care Evangaline Jou: [name] Other Clinician: Referring Lindsie Simar: Treating Inika Bellanger/Extender: [name] in Treatment: [address] [place], [place] ([id number]) [id number] Page 4 of 7 Necrotic Tissue Nursing Diagnoses: Impaired tissue integrity related to necrotic/devitalized tissue Knowledge deficit related to management of necrotic/devitalized  tissue Goals: Necrotic/devitalized tissue will be minimized in the wound bed Date Initiated: 05/23/2022 Target Resolution Date: 06/30/2022 Goal Status: Active Patient/caregiver will verbalize understanding of reason and process for debridement of necrotic tissue Date Initiated: 05/23/2022 Target Resolution Date: 06/30/2022 Goal Status: Active Interventions: Assess patient pain level pre-, during and post procedure and prior to discharge Provide education on necrotic tissue and debridement process Treatment Activities: Apply topical anesthetic as ordered : 05/23/2022 Notes: Wound/Skin Impairment Nursing Diagnoses: Impaired tissue integrity Knowledge deficit related to ulceration/compromised skin integrity Goals: Patient/caregiver will verbalize understanding of skin care regimen Date Initiated: 05/23/2022 Target Resolution Date: 06/30/2022 Goal Status: Active Interventions: Assess patient/caregiver ability to perform ulcer/skin care regimen upon admission and as needed Assess ulceration(s) every visit Treatment Activities: Skin care regimen initiated : 05/23/2022 Topical wound management initiated : 05/23/2022 Notes: Electronic Signature(s) Signed: 05/24/2022 3:42:50 PM By: Adline Peals Entered By: Adline Peals on 05/23/2022 13:39:13 -------------------------------------------------------------------------------- Pain Assessment Details Patient Name: Date of Service: DO Rebekah Sellers, MA [name] 05/23/2022 12:30 PM Medical Record Number: [id number] Patient Account Number: [id number] Date of Birth/Sex: Treating RN: 21-Mar-2001 (22 y.o. [name] Primary Care Peace Jost: [name] Other Clinician: Referring  Micheale Schlack: Treating Sincerity Cedar/Extender: [name] in Treatment: 0 Active Problems Location of Pain Severity and Description of Pain Patient Has Paino No Site Locations Rate the pain. [name], [name] ([id number])  [id number] Page 5 of 7 Rate the pain. Current Pain Level: 0 Pain Management and Medication Current Pain Management: Electronic Signature(s) Signed: 05/24/2022 3:42:50 PM By: Adline Peals Entered By: Adline Peals on 05/23/2022 13:37:58 -------------------------------------------------------------------------------- Patient/Caregiver Education Details Patient Name: Date of Service: DO [name], Franchot Erichsen. 2/27/2024andnbsp12:30 PM Medical Record Number: [id number] Patient Account Number: [id number] Date of Birth/Gender: Treating RN: Feb 28, 2001 (22 y.o. [name] Primary Care Physician: [name] Other Clinician: Referring Physician: Treating Physician/Extender: [name] in Treatment: 0 Education Assessment Education Provided To: Patient Education Topics Provided Welcome T [facility] Packet: o Methods: Explain/Verbal Responses: Reinforcements needed, State content correctly Electronic Signature(s) Signed: 05/24/2022 3:42:50 PM By: Adline Peals Entered By: Adline Peals on 05/23/2022 13:38:24 -------------------------------------------------------------------------------- Wound Assessment Details Patient Name: Date of Service: DO Rebekah Sellers, MA [name] 05/23/2022 12:30 PM Medical Record Number: [id number] Patient Account Number: [id number] Date of Birth/Sex: Treating RN: 17-Jul-2000 (22 y.o. [name] Primary Care Maizie Garno: [name] Other Clinician: Referring Mozella Rexrode: Treating Damontay Alred/Extender: [name] in Treatment: 0 Wound Status Wound Number: 1 Primary Etiology: Trauma, Other Wound Location: Right, Lateral Upper Leg Wound Status: Open Wounding Event: Trauma Comorbid History: Type II Diabetes Date Acquired: 04/07/2022 Weeks Of Treatment: 0 Clustered Wound: No [place], [name] ([id number])  [id number] Page 6 of 7 Wound Measurements Length: (cm) 2 Width: (cm) 7 Depth: (cm) 4.9 Area: (cm) 10.996 Volume: (cm) 53.878 % Reduction in Area: % Reduction in Volume: Epithelialization: Small (1-33%) Tunneling: No Undermining: No Wound Description Classification: Full Thickness Without Exposed Support Structures Wound Margin: Distinct, outline attached Exudate Amount: Medium Exudate Type: Serosanguineous Exudate Color: red, brown Foul Odor After Cleansing: No Slough/Fibrino Yes Wound Bed Granulation Amount: Large (67-100%) Exposed Structure Granulation Quality: Red, Pink Fascia Exposed: No Necrotic Amount: Small (1-33%) Fat Layer (Subcutaneous Tissue) Exposed: Yes Necrotic Quality: Eschar, Adherent Slough Tendon Exposed: No Muscle Exposed: No Joint Exposed: No Bone Exposed: No Periwound Skin Texture Texture Color No Abnormalities Noted: No No Abnormalities Noted: Yes Scarring: Yes Temperature / Pain Temperature: No Abnormality Moisture No Abnormalities Noted: No Dry / Scaly: Yes Treatment Notes Wound #1 (Upper Leg) Wound Laterality: Right, Lateral Cleanser Soap and Water Discharge Instruction: May shower and wash wound with dial antibacterial soap and water prior to dressing change. Wound Cleanser Discharge Instruction: Cleanse the wound with wound cleanser prior to applying a clean dressing using gauze sponges, not tissue or cotton balls. Peri-Wound Care Skin Prep Discharge Instruction: Use skin prep as directed Sween Lotion (Moisturizing lotion) Discharge Instruction: Apply moisturizing lotion as directed Topical Primary Dressing Iodoform packing strip 1/2 (in) Discharge Instruction: Lightly pack as instructed Sorbalgon AG Dressing 2x2 (in/in) Discharge Instruction: Apply to wound bed as instructed Secondary Dressing ABD Pad, 5x9 Discharge Instruction: Apply over primary dressing as directed. Zetuvit Plus Silicone Border  Dressing 4x4 (in/in) Discharge Instruction: Apply silicone border over primary dressing as directed. Secured With 38M Medipore H Soft Cloth Surgical T ape, 4 x 10 (in/yd) Discharge Instruction: Secure with tape as directed. Compression Wrap Compression Stockings Add-Ons [name], [name] ([id number]) [id number] Page 7 of 7 Electronic Signature(s) Signed: 05/24/2022 3:42:50 PM By: Sabas Sous By: Adline Peals on 05/23/2022 13:38:45 -------------------------------------------------------------------------------- Vitals Details Patient Name: Date of Service:  DO Rebekah Sellers, MA [name] 05/23/2022 12:30 PM Medical Record Number: [id number] Patient Account Number: [id number] Date of Birth/Sex: Treating RN: 2000/04/30 (22 y.o. [name] Primary Care Ennis Heavner: [name] Other Clinician: Referring Habeeb Puertas: Treating Kaya Klausing/Extender: [name] in Treatment: 0 Vital Signs Time Taken: 12:32 Temperature (F): 98 Height (in): 60 Pulse (bpm): 97 Source: Stated Respiratory Rate (breaths/min): 18 Weight (lbs): 380 Blood Pressure (mmHg): 127/74 Source: Stated Capillary Blood Glucose (mg/dl): 142 Body Mass Index (BMI): 74.2 Reference Range: 80 - 120 mg / dl Electronic Signature(s) Signed: 05/24/2022 3:42:50 PM By: Adline Peals Entered By: Adline Peals on 05/23/2022 13:37:06

## 2022-05-25 NOTE — Progress Notes (Signed)
Rebekah, Black ([id number]) [id number] Page 1 of 8 Visit Report for 05/23/2022 Chief Complaint Document Details Patient Name: Date of Service: DO [name], [name] 05/23/2022 12:30 PM Medical Record Number: [id number] Patient Account Number: [id number] Date of Birth/Sex: Treating RN: 10/11/2000 (22 y.o. F) Primary Care Provider: [name] Other Clinician: Referring Provider: Treating Provider/Extender: [name] in Treatment: 0 Information Obtained from: Patient Chief Complaint Patient seen for complaints of Non-Healing Wound. Electronic Signature(s) Signed: 05/23/2022 1:34:22 PM By: Fredirick Maudlin MD FACS Previous Signature: 05/23/2022 12:45:26 PM Version By: Fredirick Maudlin MD FACS Entered By: Fredirick Maudlin on 05/23/2022 13:34:21 -------------------------------------------------------------------------------- Debridement Details Patient Name: Date of Service: DO Rebekah Sellers, [name] 05/23/2022 12:30 PM Medical Record Number: [id number] Patient Account Number: [id number] Date of Birth/Sex: Treating RN: October 24, 2000 (22 y.o. [name] Primary Care Provider: [name] Other Clinician: Referring Provider: Treating Provider/Extender: [name] in Treatment: 0 Debridement Performed for Assessment: Wound #1 Right,Lateral Upper Leg Performed By: Physician Fredirick Maudlin, MD Debridement Type: Debridement Level of Consciousness (Pre-procedure): Awake and Alert Pre-procedure Verification/Time Out Yes - 13:11 Taken: Start Time: 13:11 Pain Control: Lidocaine 4% T opical Solution T Area Debrided (L x W): otal 2 (cm) x 7 (cm) = 14 (cm) Tissue and other material debrided: Non-Viable, Slough, Subcutaneous, Slough Level: Skin/Subcutaneous Tissue Debridement Description: Excisional Instrument: Curette Bleeding: Minimum Hemostasis Achieved: Pressure Response to Treatment: Procedure was  tolerated well Level of Consciousness (Post- Awake and Alert procedure): Post Debridement Measurements of Total Wound Length: (cm) 2 Width: (cm) 7 Depth: (cm) 4.9 Volume: (cm) 53.878 Character of Wound/Ulcer Post Debridement: Improved Post Procedure Diagnosis Same as Pre-procedure Notes scribed for Dr. Celine Ahr by Adline Peals, RN Electronic Signature(s) Signed: 05/23/2022 1:41:09 PM By: Fredirick Maudlin MD FACS Signed: 05/24/2022 3:42:50 PM By: Adline Peals Entered By: Adline Peals on 05/23/2022 13:38:05 Goddard, Teddi R ([id number]) [id number] Page 2 of 8 -------------------------------------------------------------------------------- HPI Details Patient Name: Date of Service: DO [name], [name]. 05/23/2022 12:30 PM Medical Record Number: [id number] Patient Account Number: [id number] Date of Birth/Sex: Treating RN: [birth year] (22 y.o. F) Primary Care Provider: [name] Other Clinician: Referring Provider: Treating Provider/Extender: [name] in Treatment: 0 History of Present Illness HPI Description: ADMISSION 05/23/2022 This is a 22 year old young woman with Prader-Willi syndrome and the usual accompanying super morbid obesity, diabetes, and skin picking disorder. She picked a large wound in her right thigh that resulted in an emergency department visit on 17 February. Apparently her father was cleaning the wound with mupirocin and hydrogen peroxide. A culture was taken that grew out Proteus and methicillin sensitive Staph aureus. She was prescribed levofloxacin. She saw her primary care provider a few days later and based on the depth and appearance of the wound, she was referred to the wound care center for further evaluation and management. There is a large area on the patient's upper right thigh that is excoriated and scaly with dry skin that has obviously been picked at. The primary wound is  4-1/2 cm in depth. It is much cleaner than described in the ED notes. There is also a linear ulcer adjacent to this deep site with slough accumulation. Electronic Signature(s) Signed: 05/23/2022 1:35:33 PM By: Fredirick Maudlin MD FACS Previous Signature: 05/23/2022 12:48:15 PM Version By: Fredirick Maudlin MD FACS Entered By: Fredirick Maudlin on 05/23/2022 13:35:33 -------------------------------------------------------------------------------- Physical Exam Details Patient Name: Date of Service: DO Rebekah Sellers, MA DISO N  R. 05/23/2022 12:30 PM Medical Record Number: [id number] Patient Account Number: [id number] Date of Birth/Sex: Treating RN: 2001-01-26 (22 y.o. F) Primary Care Provider: [name] Other Clinician: Referring Provider: Treating Provider/Extender: [name] in Treatment: 0 Constitutional . . . . No acute distress. Respiratory Normal work of breathing on room air. Notes 05/23/2022: There is a large area on the patient's upper right thigh that is excoriated and scaly with dry skin that has obviously been picked at. The primary wound is 4-1/2 cm in depth. It is much cleaner than described in the ED notes. There is also a linear ulcer adjacent to this deep site with slough accumulation. Electronic Signature(s) Signed: 05/23/2022 1:36:10 PM By: Fredirick Maudlin MD FACS Entered By: Fredirick Maudlin on 05/23/2022 13:36:10 -------------------------------------------------------------------------------- Physician Orders Details Patient Name: Date of Service: DO Rebekah Sellers, [name] 05/23/2022 12:30 PM Medical Record Number: [id number] Patient Account Number: [id number] Date of Birth/Sex: Treating RN: 08-30-2000 (22 y.o. 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 - Dr. Celine Ahr - room 2 Return A Anesthetic (In clinic) Topical Lidocaine 4% applied to wound bed Bathing/ Shower/ Hygiene May shower and wash wound with soap and water. Wound Treatment Wound #1 - Upper Leg Wound Laterality: Right, Lateral Cleanser: Soap and Water 1 x Per Day/30 Days Discharge Instructions: May shower and wash wound with dial antibacterial soap and water prior to dressing change. Cleanser: Wound Cleanser 1 x Per Day/30 Days Discharge Instructions: Cleanse the wound with wound cleanser prior to applying a clean dressing using gauze sponges, not tissue or cotton balls. Peri-Wound Care: Skin Prep 1 x Per Day/30 Days Discharge Instructions: Use skin prep as directed Peri-Wound Care: Sween Lotion (Moisturizing lotion) 1 x Per Day/30 Days Discharge Instructions: Apply moisturizing lotion as directed Prim Dressing: Iodoform packing strip 1/2 (in) 1 x Per Day/30 Days ary Discharge Instructions: Lightly pack as instructed Prim Dressing: Sorbalgon AG Dressing 2x2 (in/in) 1 x Per Day/30 Days ary Discharge Instructions: Apply to wound bed as instructed Secondary Dressing: ABD Pad, 5x9 1 x Per Day/30 Days Discharge Instructions: Apply over primary dressing as directed. Secondary Dressing: Woven Gauze Sponge, Non-Sterile 4x4 in 1 x Per Day/30 Days Discharge Instructions: Apply over primary dressing as directed. Secured With: 51M Medipore H Soft Cloth Surgical T ape, 4 x 10 (in/yd) 1 x Per Day/30 Days Discharge Instructions: Secure with tape as directed. Patient Medications llergies: gloves,  latex A Notifications Medication Indication Start End 05/24/2022 lidocaine DOSE topical 4 % cream - cream topical Electronic Signature(s) Signed: 05/23/2022 1:41:09 PM By: Fredirick Maudlin MD FACS Signed: 05/24/2022 3:42:50 PM By: Adline Peals Previous Signature: 05/23/2022 1:36:42 PM Version By: Fredirick Maudlin MD FACS Entered By: Adline Peals on 05/23/2022 13:38:16 -------------------------------------------------------------------------------- Problem List Details Patient Name: Date of Service: DO Rebekah Sellers, [name] 05/23/2022 12:30 PM Medical Record Number: [id number] Patient Account Number: [id number] Date of Birth/Sex: Treating RN: [birth year] (22 y.o. 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 05/23/2022 12:30 PM Medical Record Number: [id number] Patient Account Number: [id number] Date of Birth/Sex: Treating RN: 12-06-2000 (22 y.o. F) Primary Care Provider: [name] Other Clinician: Referring Provider: Treating Provider/Extender: [name] in Treatment: 0 Subjective Chief Complaint Information obtained from Patient Patient seen for complaints of Non-Healing Wound. History of Present Illness (HPI) ADMISSION 05/23/2022 This is a 22 year old young woman with Prader-Willi syndrome and the usual accompanying super morbid obesity, diabetes, and skin picking disorder. She picked a large wound in her right thigh that resulted in an emergency department visit on 17 February. Apparently her father was cleaning the wound with mupirocin and hydrogen peroxide. A culture was taken that grew out Proteus and methicillin sensitive Staph aureus. She was prescribed levofloxacin. She saw her primary care provider a few days later and based on the depth and appearance of the wound, she was referred to the wound care center for further evaluation and management. There is a large area on the patient's upper right thigh that is excoriated and scaly with dry skin that has obviously been picked at. The primary wound is 4-1/2 cm in depth. It is much cleaner than described in the ED notes. There is also a linear ulcer adjacent to this deep site with slough accumulation. Patient History Information obtained from Caregiver. Allergies gloves, latex Family History Hypertension - Father, Thyroid Problems - Father, No family history of Cancer, Diabetes, Heart Disease, Hereditary Spherocytosis, Kidney Disease, Lung Disease, Seizures, Stroke, Tuberculosis. Social History Never smoker, Marital Status - Single, Alcohol Use - Never, Drug Use - No History, Caffeine Use - Never. Medical History Endocrine YOLONDA, RYMAN ([id number]) [id number] Page 5 of 8 Patient has history of Type II Diabetes Patient is treated with Insulin. Blood sugar is tested. Hospitalization/Surgery History - knee surgery. - myringotomy. Medical A Surgical History Notes nd Constitutional Symptoms (General  Health) obese Musculoskeletal scoliosis Neurologic Prader-willi syndrome Psychiatric compulsive skin picking Review of Systems (ROS) Eyes Denies complaints or symptoms of Dry Eyes, Vision Changes, Glasses / Contacts. Ear/Nose/Mouth/Throat Denies complaints or symptoms of Chronic sinus problems or rhinitis. Respiratory Denies complaints or symptoms of Chronic or frequent coughs, Shortness of Breath. Cardiovascular Denies complaints or symptoms of Chest pain. Gastrointestinal Denies complaints or symptoms of Frequent diarrhea, Nausea, Vomiting. Genitourinary Denies complaints or symptoms of Frequent urination. Integumentary (Skin) Complains or has symptoms of Wounds. Musculoskeletal Complains or has symptoms of Muscle Weakness. Psychiatric Denies complaints or symptoms of Claustrophobia. Objective Constitutional No acute distress. Vitals Time Taken: 12:32 PM, Height: 60 in, Source: Stated, Weight: 380 lbs, Source: Stated, BMI: 74.2, Temperature: 98 F, Pulse: 97 bpm, Respiratory Rate: 18 breaths/min, Blood Pressure: 127/74 mmHg, Capillary Blood Glucose: 142 mg/dl. Respiratory Normal work of breathing on room air. General Notes: 05/23/2022: There is a large area on the patient's upper right thigh that is excoriated and scaly with dry skin that  has obviously been picked at. The primary wound is 4-1/2 cm in depth. It is much cleaner than described in the ED notes. There is also a linear ulcer adjacent to this deep site with slough accumulation. Integumentary (Hair, Skin) Wound #1 status is Open. Original cause of wound was Trauma. The date acquired was: 04/07/2022. The wound is located on the Right,Lateral Upper Leg. The wound measures 2cm length x 7cm width x 4.9cm depth; 10.996cm^2 area and 53.878cm^3 volume. There is Fat Layer (Subcutaneous Tissue) exposed. There is no tunneling or undermining noted. There is a medium amount of serosanguineous drainage noted. The wound margin is  distinct with the outline attached to the wound base. There is large (67-100%) red, pink granulation within the wound bed. There is a small (1-33%) amount of necrotic tissue within the wound bed including Eschar and Adherent Slough. The periwound skin appearance had no abnormalities noted for color. The periwound skin appearance exhibited: Scarring, Dry/Scaly. Periwound temperature was noted as No Abnormality. 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 There was a Excisional Skin/Subcutaneous Tissue Debridement with a total area of 14 sq cm performed by Fredirick Maudlin, MD. With the following instrument(s): Curette to remove Non-Viable tissue/material. Material removed includes Subcutaneous Tissue and Slough and after achieving pain control using Lidocaine 4% T opical Solution. No specimens were taken. A time out was conducted at 13:11, prior to the start of the procedure. A Minimum amount of bleeding was controlled with Pressure. The procedure was tolerated well. Post Debridement Measurements: 2cm length x 7cm width x 4.9cm depth; 53.878cm^3 volume. Character of Wound/Ulcer Post Debridement is improved. Post procedure Diagnosis Wound #1: Same as Pre-Procedure General Notes: scribed for Dr. Celine Ahr by Adline Peals, RN. Plan Follow-up Appointments: Return Appointment in 2 weeks. - Dr. Celine Ahr - room 2 Anesthetic: (In clinic) Topical Lidocaine 4% applied to wound bed Bathing/ Shower/ Hygiene: May shower and wash wound with soap and water. The following medication(s) was prescribed: lidocaine  topical 4 % cream cream topical was prescribed at facility WOUND #1: - Upper Leg Wound Laterality: Right, Lateral Cleanser: Soap and Water 1 x Per Day/30 Days Discharge Instructions: May shower and wash wound with dial antibacterial soap and water prior to dressing change. Cleanser: Wound Cleanser 1 x Per Day/30 Days Discharge Instructions: Cleanse the wound with wound cleanser prior to applying a clean dressing using gauze sponges, not tissue or cotton balls. Peri-Wound Care: Skin Prep 1 x Per Day/30 Days Discharge Instructions: Use skin prep as directed Peri-Wound Care: Sween Lotion (Moisturizing lotion) 1 x Per Day/30 Days Discharge Instructions: Apply moisturizing lotion as directed Prim Dressing: Iodoform packing strip 1/2 (in) 1 x Per Day/30 Days ary Discharge Instructions: Lightly pack as instructed Prim Dressing: Sorbalgon AG Dressing 2x2 (in/in) 1 x Per Day/30 Days ary Discharge Instructions: Apply to wound bed as instructed Secondary Dressing: ABD Pad, 5x9 1 x Per Day/30 Days Discharge Instructions: Apply over primary dressing as directed. Secondary Dressing: Woven Gauze Sponge, Non-Sterile 4x4 in 1 x Per Day/30 Days Discharge Instructions: Apply over primary dressing as directed. Secured With: 27M Medipore H Soft Cloth Surgical T ape, 4 x 10 (in/yd) 1 x Per Day/30 Days Discharge  Instructions: Secure with tape as directed. 05/23/2022: THis is a 22 year-old woman with Prader-Willi and a wound secondary to trauma (skin-picking). There is a large area on the patient's upper right thigh that is excoriated and scaly with dry skin that has obviously been picked at. The primary wound is 4-1/2 cm in depth. It is much cleaner than described in the ED notes. There is also a linear ulcer adjacent to this deep site with slough accumulation. I used a curette to debride slough and nonviable subcutaneous tissue from her wound. We will continue to pack the deep ulcer with half-inch iodoform  packing strips. We will apply silver alginate to the more superficial wounds and liberally moisturize the surrounding skin, hoping that by keeping the skin from getting dry and flaky, this will decrease the patient's desire to pick at it. Her father has done an excellent job of managing the wound so far. They will follow-up in 2 weeks. Electronic Signature(s) Signed: 05/23/2022 1:38:50 PM By: Fredirick Maudlin MD FACS Entered By: Fredirick Maudlin on 05/23/2022 13:38:49 -------------------------------------------------------------------------------- HxROS Details Patient Name: Date of Service: DO Rebekah Sellers, [name] 05/23/2022 12:30 PM Medical Record Number: [id number] Patient Account Number: [id number] Date of Birth/Sex: Treating RN: 11/12/2000 (22 y.o. [name] Primary Care Provider: [name] Other Clinician: Referring Provider: Treating Provider/Extender: [name] in Treatment: 0 Information Obtained From Caregiver Eyes Complaints and Symptoms: Negative for: Dry Eyes; Vision Changes; Glasses / Contacts [employer], Cassiopeia R ([id number]) [id number] Page 7 of 8 Ear/Nose/Mouth/Throat Complaints and Symptoms: Negative for: Chronic sinus problems or rhinitis Respiratory Complaints and Symptoms: Negative for: Chronic or frequent coughs; Shortness of Breath Cardiovascular Complaints and Symptoms: Negative for: Chest pain Gastrointestinal Complaints and Symptoms: Negative for: Frequent diarrhea; Nausea; Vomiting Genitourinary Complaints and Symptoms: Negative for: Frequent urination Integumentary (Skin) Complaints and Symptoms: Positive for: Wounds Musculoskeletal Complaints and Symptoms: Positive for: Muscle Weakness Medical History: Past Medical History Notes: scoliosis Psychiatric Complaints and Symptoms: Negative for: Claustrophobia Medical History: Past Medical History Notes: compulsive skin  picking Constitutional Symptoms (General Health) Medical History: Past Medical History Notes: obese Hematologic/Lymphatic Endocrine Medical History: Positive for: Type II Diabetes Time with diabetes: 3 yrs Treated with: Insulin Blood sugar tested every day: Yes Tested : Immunological Neurologic Medical History: Past Medical History Notes: Prader-willi syndrome Oncologic Immunizations Pneumococcal Vaccine: Received Pneumococcal Vaccination: No HAREEM, FATHEREE R ([id number]) [id number] Page 8 of 8 Implantable Devices None Hospitalization / Surgery History Type of Hospitalization/Surgery knee surgery myringotomy Family and Social History Cancer: No; Diabetes: No; Heart Disease: No; Hereditary Spherocytosis: No; Hypertension: Yes - Father; Kidney Disease: No; Lung Disease: No; Seizures: No; Stroke: No; Thyroid Problems: Yes - Father; Tuberculosis: No; Never smoker; Marital Status - Single; Alcohol Use: Never; Drug Use: No History; Caffeine Use: Never; Financial Concerns: No; Food, Clothing or Shelter Needs: No; Support System Lacking: No; Transportation Concerns: No Electronic Signature(s) Signed: 05/23/2022 1:41:09 PM By: Fredirick Maudlin MD FACS Signed: 05/24/2022 3:42:50 PM By: Adline Peals Entered By: Adline Peals on 05/23/2022 13:37:15 -------------------------------------------------------------------------------- SuperBill Details Patient Name: Date of Service: DO Rebekah Sellers, [name] 05/23/2022 Medical Record Number: [id number] Patient Account Number: [id number] Date of Birth/Sex: Treating RN: 11-15-2000 (22 y.o. F) Primary Care Provider: [name] Other Clinician: Referring Provider: Treating Provider/Extender: [name] in Treatment: 0 Diagnosis Coding ICD-10 Codes Code Description [id number] Non-pressure chronic ulcer of right thigh with fat layer exposed F42.4 Excoriation (skin-picking)  disorder Q87.11 Prader-Willi syndrome Z68.45 Body  mass index [BMI] 70 or greater, adult E11.65 Type 2 diabetes mellitus with hyperglycemia Facility Procedures : CPT4 Code: [id number] Description: [id number] - DEB SUBQ TISSUE 20 SQ CM/< ICD-10 Diagnosis Description [id number] Non-pressure chronic ulcer of right thigh with fat layer exposed Modifier: Quantity: 1 Physician Procedures : CPT4 Code Description Modifier [id number] - WC PHYS LEVEL 4 - NEW PT 25 ICD-10 Diagnosis Description [id number] 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

## 2022-05-25 NOTE — Progress Notes (Signed)
KAYTEE, JENNIGES ([id number]) [phone].pdf Page 1 of 4 Visit Report for 05/23/2022 Abuse Risk Screen Details Patient Name: Date of Service: DO [name], [name] 05/23/2022 12:30 PM Medical Record Number: [id number] Patient Account Number: [id number] Date of Birth/Sex: Treating RN: [birth year] (22 y.o. [name] Primary Care [name]: [name] Other Clinician: Referring [name]: Treating [name]/Extender: [name] in Treatment: 0 Abuse Risk Screen Items Answer ABUSE RISK SCREEN: Has anyone close to you tried to hurt or harm you recentlyo No Do you feel uncomfortable with anyone in your familyo No Has anyone forced you do things that you didnt want to doo No Electronic Signature(s) Signed: 05/24/2022 3:42:50 PM By: Adline Peals Entered By: Adline Peals on 05/23/2022 13:37:22 -------------------------------------------------------------------------------- Activities of Daily Living Details Patient Name: Date of Service: DO [name], [name] 05/23/2022 12:30 PM Medical Record Number: [id number] Patient Account Number: [id number] Date of Birth/Sex: Treating RN: 2001-02-09 (22 y.o. [name] Primary Care [name]: [name] Other Clinician: Referring [name]: Treating [name]/Extender: [name] in Treatment: 0 Activities of Daily Living Items Answer Activities of Daily Living (Please select one for each item) Drive Automobile Not Able T Medications ake Need Assistance Use T elephone Need Assistance Care for Appearance Need Assistance Use T oilet Need Assistance Bath / Shower Need Assistance Dress Self Need Assistance Feed Self Need Assistance Walk Need Assistance Get In / Out Bed Need Assistance Housework Need Assistance Prepare Meals Need Assistance Handle Money Need Assistance Shop for Self Need Assistance Electronic Signature(s) Signed:  05/24/2022 3:42:50 PM By: Adline Peals Entered By: Adline Peals on 05/23/2022 13:37:27 -------------------------------------------------------------------------------- Education Screening Details Patient Name: Date of Service: DO Rebekah Sellers, [name] 05/23/2022 12:30 PM Medical Record Number: [id number] Patient Account Number: [id number] Date of Birth/Sex: Treating RN: 10-22-2000 (22 y.o. [name] Primary Care [name]: [name] Other Clinician: Referring [name]: Treating [name]/Extender: [name] in Treatment: 0 [place], [place] ([id number]) [phone].pdf Page 2 of 4 Primary Learner Assessed: Patient Learning Preferences/Education Level/Primary Language Learning Preference: Explanation, Demonstration, Video, Printed Material Highest Education Level: High School Preferred Language: Diplomatic Services operational officer Language Barrier: No Translator Needed: No Memory Deficit: No Emotional Barrier: No Cultural/Religious Beliefs Affecting Medical Care: No Physical Barrier Impaired Vision: No Impaired Hearing: No Decreased Hand dexterity: No Knowledge/Comprehension Knowledge Level: Medium Comprehension Level: Medium Ability to understand written instructions: Medium Ability to understand verbal instructions: Medium Motivation Anxiety Level: Calm Cooperation: Cooperative Education Importance: Acknowledges Need Interest in Health Problems: Asks Questions Perception: Coherent Willingness to Engage in Self-Management Medium Activities: Readiness to Engage in Self-Management Medium Activities: Electronic Signature(s) Signed: 05/24/2022 3:42:50 PM By: Adline Peals Entered By: Adline Peals on 05/23/2022 13:37:31 -------------------------------------------------------------------------------- Fall Risk Assessment Details Patient Name: Date of Service: DO Rebekah Sellers, [name] 05/23/2022 12:30  PM Medical Record Number: [id number] Patient Account Number: [id number] Date of Birth/Sex: Treating RN: May 14, 2000 (22 y.o. [name] Primary Care [name]: [name] Other Clinician: Referring [name]: Treating [name]/Extender: [name] in Treatment: 0 Fall Risk Assessment Items Have you had 2 or more falls in the last 12 monthso 0 No Have you had any fall that resulted in injury in the last 12 monthso 0 No FALLS RISK SCREEN History of falling - immediate or within 3 months 0 No Secondary diagnosis (Do you have 2 or more medical diagnoseso) 15 Yes Ambulatory aid None/bed rest/wheelchair/nurse 0 No  Crutches/cane/walker 15 Yes Furniture 0 No Intravenous therapy Access/Saline/Heparin Lock 0 No Gait/Transferring Normal/ bed rest/ wheelchair 0 No Weak (short steps with or without shuffle, stooped but able to lift head while walking, may seek 10 Yes support from furniture) Impaired (short steps with shuffle, may have difficulty arising from chair, head down, impaired 20 Yes balance) Mental Status Oriented to own ability 0 Yes Overestimates or forgets limitations 0 No Risk Level: High Risk Score: 60 Gramajo, Rebekah R ([id number]) [id number].pdf Page 3 of 4 Electronic Signature(s) -------------------------------------------------------------------------------- Foot Assessment Details Patient Name: Date of Service: DO [name], [name]. 05/23/2022 12:30 PM Medical Record Number: [id number] Patient Account Number: [id number] Date of Birth/Sex: Treating RN: September 23, 2000 (22 y.o. [name] Primary Care Ranee Black: [name] Other Clinician: Referring [name]: Treating [name]/Extender: [name] in Treatment: 0 Foot Assessment Items Site Locations + = Sensation present, - = Sensation absent, C = Callus, U = Ulcer R = Redness, W = Warmth, M = Maceration, PU = Pre-ulcerative  lesion F = Fissure, S = Swelling, D = Dryness Assessment Right: Left: Other Deformity: No No Prior Foot Ulcer: No No Prior Amputation: No No Charcot Joint: No No Ambulatory Status: Gait: Electronic Signature(s) Signed: 05/24/2022 3:42:50 PM By: Adline Peals Entered By: Adline Peals on 05/23/2022 13:37:45 -------------------------------------------------------------------------------- Nutrition Risk Screening Details Patient Name: Date of Service: DO [name], [place] R. 05/23/2022 12:30 PM Medical Record Number: [id number] Patient Account Number: [id number] Date of Birth/Sex: Treating RN: [birth year] (22 y.o. [name] Primary Care Rielly Brunn: [name] Other Clinician: Referring Ekaterini Capitano: Treating Mahati Vajda/Extender: [name] in Treatment: 0 Height (in): 60 Weight (lbs): 380 Body Mass Index (BMI): 74.2 Eichinger, [name] ([id number]) [id number].pdf Page 4 of 4 Nutrition Risk Screening Items Score Screening NUTRITION RISK SCREEN: I have an illness or condition that made me change the kind and/or amount of food I eat 2 Yes I eat fewer than two meals per day 0 No I eat few fruits and vegetables, or milk products 0 No I have three or more drinks of beer, liquor or wine almost every day 0 No I have tooth or mouth problems that make it hard for me to eat 0 No I don't always have enough money to buy the food I need 0 No I eat alone most of the time 0 No I take three or more different prescribed or over-the-counter drugs a day 1 Yes Without wanting to, I have lost or gained 10 pounds in the last six months 0 No I am not always physically able to shop, cook and/or feed myself 2 Yes Nutrition Protocols Good Risk Protocol Moderate Risk Protocol 0 Provide education on nutrition High Risk Proctocol Risk Level: Moderate Risk Score: 5 Electronic Signature(s) Signed: 05/24/2022 3:42:50 PM By: Adline Peals Entered By: Adline Peals on 05/23/2022 13:37:40

## 2022-05-31 ENCOUNTER — Encounter: Payer: Self-pay | Admitting: Nurse Practitioner

## 2022-06-01 ENCOUNTER — Other Ambulatory Visit: Payer: Self-pay

## 2022-06-01 ENCOUNTER — Other Ambulatory Visit (HOSPITAL_BASED_OUTPATIENT_CLINIC_OR_DEPARTMENT_OTHER): Payer: Self-pay

## 2022-06-01 DIAGNOSIS — T148XXA Other injury of unspecified body region, initial encounter: Secondary | ICD-10-CM

## 2022-06-01 MED ORDER — MUPIROCIN 2 % EX OINT
TOPICAL_OINTMENT | CUTANEOUS | 6 refills | Status: DC
  Filled 2022-06-02: qty 22, 14d supply, fill #0
  Filled 2022-06-14 – 2022-06-15 (×2): qty 22, 14d supply, fill #1
  Filled 2022-06-23 – 2022-06-26 (×3): qty 22, 14d supply, fill #2

## 2022-06-02 ENCOUNTER — Other Ambulatory Visit (HOSPITAL_BASED_OUTPATIENT_CLINIC_OR_DEPARTMENT_OTHER): Payer: Self-pay

## 2022-06-03 ENCOUNTER — Other Ambulatory Visit (HOSPITAL_BASED_OUTPATIENT_CLINIC_OR_DEPARTMENT_OTHER): Payer: Self-pay

## 2022-06-05 ENCOUNTER — Ambulatory Visit (HOSPITAL_BASED_OUTPATIENT_CLINIC_OR_DEPARTMENT_OTHER): Payer: 59 | Admitting: Internal Medicine

## 2022-06-06 ENCOUNTER — Ambulatory Visit (HOSPITAL_BASED_OUTPATIENT_CLINIC_OR_DEPARTMENT_OTHER): Payer: 59 | Admitting: General Surgery

## 2022-06-09 ENCOUNTER — Encounter (HOSPITAL_BASED_OUTPATIENT_CLINIC_OR_DEPARTMENT_OTHER): Payer: 59 | Attending: General Surgery | Admitting: Internal Medicine

## 2022-06-09 DIAGNOSIS — L97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: Secondary | ICD-10-CM | POA: Diagnosis not present

## 2022-06-09 DIAGNOSIS — Q8711 Prader-Willi syndrome: Secondary | ICD-10-CM | POA: Diagnosis not present

## 2022-06-09 DIAGNOSIS — E1165 Type 2 diabetes mellitus with hyperglycemia: Secondary | ICD-10-CM | POA: Insufficient documentation

## 2022-06-09 DIAGNOSIS — E11622 Type 2 diabetes mellitus with other skin ulcer: Secondary | ICD-10-CM | POA: Diagnosis not present

## 2022-06-09 DIAGNOSIS — F424 Excoriation (skin-picking) disorder: Secondary | ICD-10-CM | POA: Diagnosis not present

## 2022-06-10 NOTE — Progress Notes (Signed)
KEILIN, DOUGHTEN ([id number]) [id number] Page 1 of 8 Visit Report for 06/09/2022 Arrival Information Details Patient Name: Date of Service: DO [name], [name] 06/09/2022 2:15 PM Medical Record Number: [id number] Patient Account Number: [id number] Date of Birth/Sex: Treating RN: Aug 08, 2000 (22 y.o. F) Primary Care Sulay Brymer: [name] Other Clinician: Referring Loucile Posner: Treating Ayda Tancredi/Extender: Claudie Leach in Treatment: 2 Visit Information History Since Last Visit All ordered tests and consults were completed: No Patient Arrived: Gilford Rile Added or deleted any medications: No Arrival Time: 14:30 Any new allergies or adverse reactions: No Accompanied By: father Had a fall or experienced change in No Transfer Assistance: None activities of daily living that may affect Patient Identification Verified: Yes risk of falls: Secondary Verification Process Completed: Yes Signs or symptoms of abuse/neglect since last visito No Patient Requires Transmission-Based Precautions: No Hospitalized since last visit: No Implantable device outside of the clinic excluding No cellular tissue based products placed in the center since last visit: Pain Present Now: No Electronic Signature(s) Signed: 06/09/2022 3:35:49 PM By: Worthy Rancher Entered By: Worthy Rancher on 06/09/2022 14:31:35 -------------------------------------------------------------------------------- Clinic Level of Care Assessment Details Patient Name: Date of Service: DO [name], [name]. 06/09/2022 2:15 PM Medical Record Number: [id number] Patient Account Number: [id number] Date of Birth/Sex: Treating RN: 06/01/2000 (22 y.o. America Brown Primary Care Alawna Graybeal: [name] Other Clinician: Referring Jacqeline Broers: Treating Kadeidra Coryell/Extender: Claudie Leach in Treatment: 2 Clinic Level of Care Assessment Items TOOL 4 Quantity Score X- 1 0 Use when only  an EandM is performed on FOLLOW-UP visit ASSESSMENTS - Nursing Assessment / Reassessment X- 1 10 Reassessment of Co-morbidities (includes updates in patient status) X- 1 5 Reassessment of Adherence to Treatment Plan ASSESSMENTS - Wound and Skin A ssessment / Reassessment X - Simple Wound Assessment / Reassessment - one wound 1 5 []  - 0 Complex Wound Assessment / Reassessment - multiple wounds []  - 0 Dermatologic / Skin Assessment (not related to wound area) ASSESSMENTS - Focused Assessment []  - 0 Circumferential Edema Measurements - multi extremities []  - 0 Nutritional Assessment / Counseling / Intervention []  - 0 Lower Extremity Assessment (monofilament, tuning fork, pulses) []  - 0 Peripheral Arterial Disease Assessment (using hand held doppler) ASSESSMENTS - Ostomy and/or Continence Assessment and Care []  - 0 Incontinence Assessment and Management []  - 0 Ostomy Care Assessment and Management (repouching, etc.) PROCESS - Coordination of Care X - Simple Patient / Family Education for ongoing care 1 15 [place], [place] ([id number]) [id number] Page 2 of 8 []  - 0 Complex (extensive) Patient / Family Education for ongoing care X- 1 10 Staff obtains Consents, Records, T Results / Process Orders est X- 1 10 Staff telephones HHA, Nursing Homes / Clarify orders / etc []  - 0 Routine Transfer to another Facility (non-emergent condition) []  - 0 Routine Hospital Admission (non-emergent condition) []  - 0 New Admissions / Biomedical engineer / Ordering NPWT Apligraf, etc. , []  - 0 Emergency Hospital Admission (emergent condition) X- 1 10 Simple Discharge Coordination []  - 0 Complex (extensive) Discharge Coordination PROCESS - Special Needs []  - 0 Pediatric / Minor Patient Management []  - 0 Isolation Patient Management []  - 0 Hearing / Language / Visual special needs []  - 0 Assessment of Community assistance (transportation, D/C planning,  etc.) []  - 0 Additional assistance / Altered mentation []  - 0 Support Surface(s) Assessment (bed, cushion, seat, etc.) INTERVENTIONS - Wound Cleansing / Measurement X - Simple Wound Cleansing -  one wound 1 5 []  - 0 Complex Wound Cleansing - multiple wounds X- 1 5 Wound Imaging (photographs - any number of wounds) []  - 0 Wound Tracing (instead of photographs) X- 1 5 Simple Wound Measurement - one wound []  - 0 Complex Wound Measurement - multiple wounds INTERVENTIONS - Wound Dressings X - Small Wound Dressing one or multiple wounds 1 10 []  - 0 Medium Wound Dressing one or multiple wounds []  - 0 Large Wound Dressing one or multiple wounds []  - 0 Application of Medications - topical []  - 0 Application of Medications - injection INTERVENTIONS - Miscellaneous []  - 0 External ear exam []  - 0 Specimen Collection (cultures, biopsies, blood, body fluids, etc.) []  - 0 Specimen(s) / Culture(s) sent or taken to Lab for analysis []  - 0 Patient Transfer (multiple staff / Civil Service fast streamer / Similar devices) []  - 0 Simple Staple / Suture removal (25 or less) []  - 0 Complex Staple / Suture removal (26 or more) []  - 0 Hypo / Hyperglycemic Management (close monitor of Blood Glucose) []  - 0 Ankle / Brachial Index (ABI) - do not check if billed separately X- 1 5 Vital Signs Has the patient been seen at the hospital within the last three years: Yes Total Score: 95 Level Of Care: New/Established - Level 3 Electronic Signature(s) Signed: 06/09/2022 4:45:55 PM By: Dellie Catholic RN Entered By: Dellie Catholic on 06/09/2022 15:38:01 Coakley, Dyanne R ([id number]) [id number] Page 3 of 8 -------------------------------------------------------------------------------- Encounter Discharge Information Details Patient Name: Date of Service: DO [name], [name] 06/09/2022 2:15 PM Medical Record Number: [id number] Patient Account Number: [id number] Date of Birth/Sex:  Treating RN: [birth year] (22 y.o. America Brown Primary Care Chelcy Bolda: [name] Other Clinician: Referring Daliah Chaudoin: Treating Zonie Crutcher/Extender: Claudie Leach in Treatment: 2 Encounter Discharge Information Items Discharge Condition: Stable Ambulatory Status: Walker Discharge Destination: Home Transportation: Private Auto Accompanied By: dad Schedule Follow-up Appointment: Yes Clinical Summary of Care: Patient Declined Electronic Signature(s) Signed: 06/09/2022 4:45:55 PM By: Dellie Catholic RN Entered By: Dellie Catholic on 06/09/2022 15:40:18 -------------------------------------------------------------------------------- Lower Extremity Assessment Details Patient Name: Date of Service: DO [name], Vangie Bicker R. 06/09/2022 2:15 PM Medical Record Number: [id number] Patient Account Number: [id number] Date of Birth/Sex: Treating RN: [birth year] (22 y.o. America Brown Primary Care An Schnabel: [name] Other Clinician: Referring Tristyn Demarest: Treating Jailynne Opperman/Extender: Claudie Leach in Treatment: 2 Electronic Signature(s) Signed: 06/09/2022 4:45:55 PM By: Dellie Catholic RN Entered By: Dellie Catholic on 06/09/2022 14:52:20 -------------------------------------------------------------------------------- Multi Wound Chart Details Patient Name: Date of Service: DO Rebekah Sellers, [name] 06/09/2022 2:15 PM Medical Record Number: [id number] Patient Account Number: [id number] Date of Birth/Sex: Treating RN: Jan 31, 2001 (22 y.o. F) Primary Care Dariya Gainer: [name] Other Clinician: Referring Kingslee Mairena: Treating Jo Cerone/Extender: Claudie Leach in Treatment: 2 Vital Signs Height(in): 60 Capillary Blood Glucose(mg/dl): 123 Weight(lbs): 380 Pulse(bpm): 101 Body Mass Index(BMI): 74.2 Blood Pressure(mmHg): 153/125 Temperature(F): 97.4 Respiratory Rate(breaths/min): 20 [1:Photos:] [N/A:N/A] DELISIA, VANALSTINE  ([id number]) [1:Right, Lateral Upper Leg Wound Location: Trauma Wounding Event: Trauma, Other Primary Etiology: Type II Diabetes Comorbid History: 04/07/2022 Date Acquired: 2 Weeks of Treatment: Open Wound Status: No Wound Recurrence: 0.9x2.5x4.5 Measurements L x W x  D (cm) 1.767 A (cm) : rea 7.952 Volume (cm) : 83.90% % Reduction in A rea: 85.20% % Reduction in Volume: Full Thickness Without Exposed Classification: Support Structures Medium Exudate A mount: Serosanguineous Exudate Type: red, brown Exudate Color:  Distinct, outline attached  Wound Margin: Large (67-100%) Granulation Amount: Red, Pink Granulation Quality: Small (1-33%) Necrotic Amount: Eschar, Adherent Slough Necrotic Tissue: Fat Layer (Subcutaneous Tissue): Yes N/A Exposed Structures: Fascia: No  Tendon: No Muscle: No Joint: No Bone: No Small (1-33%) Epithelialization: Scarring: Yes Periwound Skin Texture: Dry/Scaly: Yes Periwound Skin Moisture: No Abnormalities Noted Periwound Skin Color: No Abnormality Temperature:] [N/A:N/A N/A N/A N/A N/A N/A  N/A N/A N/A N/A N/A N/A N/A N/A N/A N/A N/A N/A N/A N/A N/A N/A N/A N/A N/A N/A N/A] Treatment Notes Electronic Signature(s) Signed: 06/09/2022 3:54:15 PM By: Kalman Shan DO Entered By: Kalman Shan on 06/09/2022 15:21:18 -------------------------------------------------------------------------------- Multi-Disciplinary Care Plan Details Patient Name: Date of Service: DO Rebekah Sellers, [name] 06/09/2022 2:15 PM Medical Record Number: [id number] Patient Account Number: [id number] Date of Birth/Sex: Treating RN: 05-27-2000 (22 y.o. America Brown Primary Care Brynnlie Unterreiner: [name] Other Clinician: Referring Euphemia Lingerfelt: Treating Lauralynn Loeb/Extender: Claudie Leach in Treatment: 2 Active Inactive Necrotic Tissue Nursing Diagnoses: Impaired tissue integrity related to necrotic/devitalized tissue Knowledge deficit related to management of necrotic/devitalized  tissue Goals: Necrotic/devitalized tissue will be minimized in the wound bed Date Initiated: 05/23/2022 Target Resolution Date: 11/30/2022 Goal Status: Active Patient/caregiver will verbalize understanding of reason and process for debridement of necrotic tissue Date Initiated: 05/23/2022 Target Resolution Date: 11/30/2022 Goal Status: Active Interventions: Assess patient pain level pre-, during and post procedure and prior to discharge Provide education on necrotic tissue and debridement process Treatment Activities: Apply topical anesthetic as ordered : 05/23/2022 ADRAYA, HOWTON ([id number]) [id number] Page 5 of 8 Notes: Wound/Skin Impairment Nursing Diagnoses: Impaired tissue integrity Knowledge deficit related to ulceration/compromised skin integrity Goals: Patient/caregiver will verbalize understanding of skin care regimen Date Initiated: 05/23/2022 Target Resolution Date: 11/30/2022 Goal Status: Active Interventions: Assess patient/caregiver ability to perform ulcer/skin care regimen upon admission and as needed Assess ulceration(s) every visit Treatment Activities: Skin care regimen initiated : 05/23/2022 Topical wound management initiated : 05/23/2022 Notes: Electronic Signature(s) Signed: 06/09/2022 4:45:55 PM By: Dellie Catholic RN Entered By: Dellie Catholic on 06/09/2022 15:35:19 -------------------------------------------------------------------------------- Pain Assessment Details Patient Name: Date of Service: DO Rebekah Sellers, [name] 06/09/2022 2:15 PM Medical Record Number: [id number] Patient Account Number: [id number] Date of Birth/Sex: Treating RN: 2000-10-28 (22 y.o. F) Primary Care Berley Gambrell: [name] Other Clinician: Referring Kearie Mennen: Treating Avalie Oconnor/Extender: Claudie Leach in Treatment: 2 Active Problems Location of Pain Severity and Description of Pain Patient Has Paino No Site Locations Pain  Management and Medication Current Pain Management: Electronic Signature(s) Signed: 06/09/2022 3:35:49 PM By: Worthy Rancher Entered By: Worthy Rancher on 06/09/2022 14:33:47 Frett, Dellia R ([id number]) [id number] Page 6 of 8 -------------------------------------------------------------------------------- Patient/Caregiver Education Details Patient Name: Date of Service: DO [name], [name] 3/15/2024andnbsp2:15 PM Medical Record Number: [id number] Patient Account Number: [id number] Date of Birth/Gender: Treating RN: 08-17-2000 (22 y.o. America Brown Primary Care Physician: [name] Other Clinician: Referring Physician: Treating Physician/Extender: Claudie Leach in Treatment: 2 Education Assessment Education Provided To: Patient Education Topics Provided Wound/Skin Impairment: Methods: Explain/Verbal Responses: Return demonstration correctly Electronic Signature(s) Signed: 06/09/2022 4:45:55 PM By: Dellie Catholic RN Entered By: Dellie Catholic on 06/09/2022 15:35:53 -------------------------------------------------------------------------------- Wound Assessment Details Patient Name: Date of Service: DO Rebekah Sellers, [name] 06/09/2022 2:15 PM Medical Record Number: [id number] Patient Account Number: [id number] Date of Birth/Sex: Treating RN: 2000/05/03 (22 y.o. F) Primary Care Trebor Galdamez: [name] Other Clinician: Referring Dontrail Blackwell: Treating Aztlan Coll/Extender: Claudie Leach  in Treatment: 2 Wound Status Wound Number: 1 Primary Etiology: Trauma, Other Wound Location: Right, Lateral Upper Leg Wound Status: Open Wounding Event: Trauma Comorbid History: Type II Diabetes Date Acquired: 04/07/2022 Weeks Of Treatment: 2 Clustered Wound: No Photos Wound Measurements Length: (cm) 0.9 Width: (cm) 2.5 Depth: (cm) 4.5 Area: (cm) 1.767 Volume: (cm) 7.952 % Reduction in Area: 83.9% % Reduction in  Volume: 85.2% Epithelialization: Small (1-33%) Tunneling: No Undermining: No Wound Description Classification: Full Thickness Without Exposed Support Structures Wound Margin: Distinct, outline attached Exudate Amount: Medium Exudate Type: Serosanguineous Exudate Color: red, brown Messler, Maeli R ([id number]) Foul Odor After Cleansing: No Slough/Fibrino Yes [id number] Page 7 of 8 Wound Bed Granulation Amount: Large (67-100%) Exposed Structure Granulation Quality: Red, Pink Fascia Exposed: No Necrotic Amount: Small (1-33%) Fat Layer (Subcutaneous Tissue) Exposed: Yes Necrotic Quality: Eschar, Adherent Slough Tendon Exposed: No Muscle Exposed: No Joint Exposed: No Bone Exposed: No Periwound Skin Texture Texture Color No Abnormalities Noted: No No Abnormalities Noted: Yes Scarring: Yes Temperature / Pain Temperature: No Abnormality Moisture No Abnormalities Noted: No Dry / Scaly: Yes Treatment Notes Wound #1 (Upper Leg) Wound Laterality: Right, Lateral Cleanser Soap and Water Discharge Instruction: May shower and wash wound with dial antibacterial soap and water prior to dressing change. Wound Cleanser Discharge Instruction: Cleanse the wound with wound cleanser prior to applying a clean dressing using gauze sponges, not tissue or cotton balls. Peri-Wound Care Skin Prep Discharge Instruction: Use skin prep as directed Sween Lotion (Moisturizing lotion) Discharge Instruction: Apply moisturizing lotion as directed Topical Primary Dressing Iodoform packing strip 1/2 (in) Discharge Instruction: Lightly pack as instructed Sorbalgon AG Dressing 2x2 (in/in) Discharge Instruction: Apply to wound bed as instructed Secondary Dressing ABD Pad, 5x9 Discharge Instruction: Apply over primary dressing as directed. Woven Gauze Sponge, Non-Sterile 4x4 in Discharge Instruction: Apply over primary dressing as directed. Secured With 43M Medipore H Soft  Cloth Surgical T ape, 4 x 10 (in/yd) Discharge Instruction: Secure with tape as directed. Compression Wrap Compression Stockings Add-Ons Electronic Signature(s) Signed: 06/09/2022 4:45:55 PM By: Dellie Catholic RN Entered By: Dellie Catholic on 06/09/2022 15:05:21 -------------------------------------------------------------------------------- Vitals Details Patient Name: Date of Service: DO Rebekah Sellers, [name] 06/09/2022 2:15 PM Medical Record Number: [id number] Patient Account Number: [id number] Date of Birth/Sex: Treating RN: 06/14/2000 (22 y.o. F) Primary Care Henslee Lottman: [name] Other Clinician: Referring Lillianna Sabel: Treating Ralene Gasparyan/Extender: Claudie Leach in Treatment: [place], [place] ([id number]) [id number] Page 8 of 8 Vital Signs Time Taken: 02:31 Temperature (F): 97.4 Height (in): 60 Pulse (bpm): 101 Weight (lbs): 380 Respiratory Rate (breaths/min): 20 Body Mass Index (BMI): 74.2 Blood Pressure (mmHg): 153/125 Capillary Blood Glucose (mg/dl): 123 Reference Range: 80 - 120 mg / dl Electronic Signature(s) Signed: 06/09/2022 3:35:49 PM By: Worthy Rancher Entered By: Worthy Rancher on 06/09/2022 14:33:29

## 2022-06-10 NOTE — Progress Notes (Signed)
STEPH, DOCKUM ([id number]) [id number] Page 1 of 6 Visit Report for 06/09/2022 Chief Complaint Document Details Patient Name: Date of Service: DO [place], [name] 06/09/2022 2:15 PM Medical Record Number: [id number] Patient Account Number: [id number] Date of Birth/Sex: Treating RN: 30-Sep-2000 (22 y.o. F) Primary Care Provider: [name] Other Clinician: Referring Provider: Treating Provider/Extender: Claudie Leach in Treatment: 2 Information Obtained from: Patient Chief Complaint Patient seen for complaints of Non-Healing Wound. Electronic Signature(s) Signed: 06/09/2022 3:54:15 PM By: Kalman Shan DO Entered By: Kalman Shan on 06/09/2022 15:21:25 -------------------------------------------------------------------------------- HPI Details Patient Name: Date of Service: DO [name], [name] 06/09/2022 2:15 PM Medical Record Number: [id number] Patient Account Number: [id number] Date of Birth/Sex: Treating RN: [birth year] (22 y.o. F) Primary Care Provider: [name] Other Clinician: Referring Provider: Treating Provider/Extender: Claudie Leach in Treatment: 2 History of Present Illness HPI Description: ADMISSION 05/23/2022 This is a 22 year old young woman with Prader-Willi syndrome and the usual accompanying super morbid obesity, diabetes, and skin picking disorder. She picked a large wound in her right thigh that resulted in an emergency department visit on 17 February. Apparently her father was cleaning the wound with mupirocin and hydrogen peroxide. A culture was taken that grew out Proteus and methicillin sensitive Staph aureus. She was prescribed levofloxacin. She saw her primary care provider a few days later and based on the depth and appearance of the wound, she was referred to the wound care center for further evaluation and management. There is a large area on the patient's upper right  thigh that is excoriated and scaly with dry skin that has obviously been picked at. The primary wound is 4-1/2 cm in depth. It is much cleaner than described in the ED notes. There is also a linear ulcer adjacent to this deep site with slough accumulation. 3/15; patient presents for follow-up. She has been using iodoform packing followed by silver alginate to the wound bed. Mother is present during the encounter. There is been improvement in wound healing. Electronic Signature(s) Signed: 06/09/2022 3:54:15 PM By: Kalman Shan DO Entered By: Kalman Shan on 06/09/2022 15:22:03 -------------------------------------------------------------------------------- Physical Exam Details Patient Name: Date of Service: DO [name], [name] 06/09/2022 2:15 PM Medical Record Number: [id number] Patient Account Number: [id number] Date of Birth/Sex: Treating RN: [birth year] (22 y.o. F) Primary Care Provider: [name] Other Clinician: Referring Provider: Treating Provider/Extender: Claudie Leach in Treatment: 2 Constitutional respirations regular, non-labored and within target range for patient.. Cardiovascular 2+ dorsalis pedis/posterior tibialis pulses. ALEYCIA, AUGUSTIN ([id number]) [id number] Page 2 of 6 Psychiatric pleasant and cooperative. Notes There is an open wound upper right thigh with increased and granulation tissue at the opening. Electronic Signature(s) Signed: 06/09/2022 3:54:15 PM By: Kalman Shan DO Entered By: Kalman Shan on 06/09/2022 15:28:46 -------------------------------------------------------------------------------- Physician Orders Details Patient Name: Date of Service: DO [name], [name] 06/09/2022 2:15 PM Medical Record Number: [id number] Patient Account Number: [id number] Date of Birth/Sex: Treating RN: [birth year] (22 y.o. [place] Primary Care Provider: [name] Other  Clinician: Referring Provider: Treating Provider/Extender: Claudie Leach in Treatment: 2 Verbal / Phone Orders: No Diagnosis Coding Follow-up Appointments ppointment in 2 weeks. - Dr. Celine Ahr - Room 2 Return A Anesthetic (In clinic) Topical Lidocaine 4% applied to wound bed Bathing/ Shower/ Hygiene May shower and wash wound with soap and water. Wound Treatment Wound #1 - Upper Leg Wound Laterality: Right, Lateral Cleanser:  Soap and Water 1 x Per Day/30 Days Discharge Instructions: May shower and wash wound with dial antibacterial soap and water prior to dressing change. Cleanser: Wound Cleanser (DME) (Generic) 1 x Per Day/30 Days Discharge Instructions: Cleanse the wound with wound cleanser prior to applying a clean dressing using gauze sponges, not tissue or cotton balls. Peri-Wound Care: Skin Prep (DME) (Generic) 1 x Per Day/30 Days Discharge Instructions: Use skin prep as directed Peri-Wound Care: Sween Lotion (Moisturizing lotion) 1 x Per Day/30 Days Discharge Instructions: Apply moisturizing lotion as directed Prim Dressing: Iodoform packing strip 1/2 (in) (DME) (Generic) 1 x Per Day/30 Days ary Discharge Instructions: Lightly pack as instructed Prim Dressing: Sorbalgon AG Dressing 2x2 (in/in) (DME) (Generic) 1 x Per Day/30 Days ary Discharge Instructions: Apply to wound bed as instructed Secondary Dressing: ABD Pad, 5x9 (DME) (Generic) 1 x Per Day/30 Days Discharge Instructions: Apply over primary dressing as directed. Secondary Dressing: Woven Gauze Sponge, Non-Sterile 4x4 in (DME) (Generic) 1 x Per Day/30 Days Discharge Instructions: Apply over primary dressing as directed. Secured With: 83M Medipore H Soft Cloth Surgical T ape, 4 x 10 (in/yd) (DME) (Generic) 1 x Per Day/30 Days Discharge Instructions: Secure with tape as directed. Electronic Signature(s) Signed: 06/09/2022 3:54:15 PM By: Kalman Shan DO Entered By: Kalman Shan on 06/09/2022  15:28:57 Problem List Details -------------------------------------------------------------------------------- Paula Compton ([id number]) [id number] Page 3 of 6 Patient Name: Date of Service: DO [name], [name] 06/09/2022 2:15 PM Medical Record Number: [id number] Patient Account Number: [id number] Date of Birth/Sex: Treating RN: 05-12-2000 (22 y.o. 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 06/09/2022 2:15 PM Medical Record Number: [id number] Patient Account Number: [id number] Date of Birth/Sex: Treating RN: June 07, 2000 (22 y.o. F) Primary Care Provider: [name] Other Clinician: Referring Provider: Treating Provider/Extender: Claudie Leach in Treatment: 2 Subjective Chief Complaint Information obtained from Patient Patient seen for complaints of Non-Healing Wound. History of Present Illness (HPI) ADMISSION 05/23/2022 This is a 22 year old young woman with Prader-Willi syndrome and the usual accompanying super morbid obesity, diabetes, and skin picking disorder. She picked a large wound in  her right thigh that resulted in an emergency department visit on 17 February. Apparently her father was cleaning the wound with mupirocin and hydrogen peroxide. A culture was taken that grew out Proteus and methicillin sensitive Staph aureus. She was prescribed levofloxacin. She saw her primary care provider a few days later and based on the depth and appearance of the wound, she was referred to the wound care center for further evaluation and management. There is a large area on the patient's upper right thigh that is excoriated and scaly with dry skin that has obviously been picked at. The primary wound is 4-1/2 cm in depth. It is much cleaner than described in the ED notes. There is also a linear ulcer adjacent to  this deep site with slough accumulation. 3/15; patient presents for follow-up. She has been using iodoform packing followed by silver alginate to the wound bed. Mother is present during the encounter. There is been improvement in wound healing. Patient History DEANI, MELBOURNE ([id number]) [id number] Page 4 of 6 Information obtained from Caregiver. Family History Hypertension - Father, Thyroid Problems - Father, No family history of Cancer, Diabetes, Heart Disease, Hereditary Spherocytosis, Kidney Disease, Lung Disease, Seizures, Stroke, Tuberculosis. Social History Never smoker, Marital Status - Single, Alcohol Use - Never, Drug Use - No History, Caffeine Use - Never. Medical History Endocrine Patient has history of Type II Diabetes Hospitalization/Surgery History - knee surgery. - myringotomy. Medical A Surgical History Notes nd Constitutional Symptoms (General Health) obese Musculoskeletal scoliosis Neurologic Prader-willi syndrome Psychiatric compulsive skin picking Objective Constitutional respirations regular, non-labored and within target range for patient.. Vitals Time Taken: 2:31 AM, Height: 60 in, Weight: 380 lbs, BMI: 74.2,  Temperature: 97.4 F, Pulse: 101 bpm, Respiratory Rate: 20 breaths/min, Blood Pressure: 153/125 mmHg, Capillary Blood Glucose: 123 mg/dl. Cardiovascular 2+ dorsalis pedis/posterior tibialis pulses. Psychiatric pleasant and cooperative. General Notes: There is an open wound upper right thigh with increased and granulation tissue at the opening. Integumentary (Hair, Skin) Wound #1 status is Open. Original cause of wound was Trauma. The date acquired was: 04/07/2022. The wound has been in treatment 2 weeks. The wound is located on the Right,Lateral Upper Leg. The wound measures 0.9cm length x 2.5cm width x 4.5cm depth; 1.767cm^2 area and 7.952cm^3 volume. There is Fat Layer (Subcutaneous Tissue) exposed. There is no tunneling or undermining noted. There is a medium amount of serosanguineous drainage noted. The wound margin is distinct with the outline attached to the wound base. There is large (67-100%) red, pink granulation within the wound bed. There is a small (1-33%) amount of necrotic tissue within the wound bed including Eschar and Adherent Slough. The periwound skin appearance had no abnormalities noted for color. The periwound skin appearance exhibited: Scarring, Dry/Scaly. Periwound temperature was noted as No Abnormality. 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 I recommended continuing iodoform packing and silver alginate to the wound bed. Follow-up in 2 weeks. Plan Follow-up Appointments: Return Appointment in 2 weeks. - Dr. Celine Ahr - Room 2 Anesthetic: REANNON, URBAN ([id number]) [id number] Page 5 of 6 (In clinic) Topical Lidocaine 4% applied to wound  bed Bathing/ Shower/ Hygiene: May shower and wash wound with soap and water. WOUND #1: - Upper Leg Wound Laterality: Right, Lateral Cleanser: Soap and Water 1 x Per Day/30 Days Discharge Instructions: May shower and wash wound with dial antibacterial soap and water prior to dressing change. Cleanser: Wound Cleanser (DME) (Generic) 1 x Per Day/30 Days Discharge Instructions: Cleanse the wound with wound cleanser prior to applying a clean dressing using gauze sponges, not tissue or cotton balls. Peri-Wound Care: Skin Prep (DME) (Generic) 1 x Per Day/30 Days Discharge Instructions: Use skin prep as directed Peri-Wound Care: Sween Lotion (Moisturizing lotion) 1 x Per Day/30 Days Discharge Instructions: Apply moisturizing lotion as directed Prim Dressing: Iodoform packing strip 1/2 (in) (DME) (Generic) 1 x Per Day/30 Days ary Discharge Instructions: Lightly pack as instructed Prim Dressing: Sorbalgon AG Dressing 2x2 (in/in) (DME) (Generic) 1 x Per Day/30 Days ary Discharge Instructions: Apply  to wound bed as instructed Secondary Dressing: ABD Pad, 5x9 (DME) (Generic) 1 x Per Day/30 Days Discharge Instructions: Apply over primary dressing as directed. Secondary Dressing: Woven Gauze Sponge, Non-Sterile 4x4 in (DME) (Generic) 1 x Per Day/30 Days Discharge Instructions: Apply over primary dressing as directed. Secured With: 76M Medipore H Soft Cloth Surgical T ape, 4 x 10 (in/yd) (DME) (Generic) 1 x Per Day/30 Days Discharge Instructions: Secure with tape as directed. 1. Iodoform packing with silver alginate 2. Follow-up in 2 weeks Electronic Signature(s) Signed: 06/09/2022 3:54:15 PM By: Kalman Shan DO Entered By: Kalman Shan on 06/09/2022 15:30:25 -------------------------------------------------------------------------------- HxROS Details Patient Name: Date of Service: DO [name], [name] 06/09/2022 2:15 PM Medical Record Number: [id number] Patient Account Number:  [id number] Date of Birth/Sex: Treating RN: [birth year] (22 y.o. F) Primary Care Provider: [name] Other Clinician: Referring Provider: Treating Provider/Extender: Claudie Leach in Treatment: 2 Information Obtained From Caregiver Constitutional Symptoms (General Health) Medical History: Past Medical History Notes: obese Endocrine Medical History: Positive for: Type II Diabetes Time with diabetes: 3 yrs Treated with: Insulin Blood sugar tested every day: Yes Tested : Musculoskeletal Medical History: Past Medical History Notes: scoliosis Neurologic Medical History: Past Medical History Notes: Prader-willi syndrome Psychiatric Medical History: Past Medical History Notes: compulsive skin picking JOFFE, Naeemah R ([id number]) [id number] Page 6 of 6 Immunizations Pneumococcal Vaccine: Received Pneumococcal Vaccination: No Implantable Devices None Hospitalization / Surgery History Type of Hospitalization/Surgery knee surgery myringotomy Family and Social History Cancer: No; Diabetes: No; Heart Disease: No; Hereditary Spherocytosis: No; Hypertension: Yes - Father; Kidney Disease: No; Lung Disease: No; Seizures: No; Stroke: No; Thyroid Problems: Yes - Father; Tuberculosis: No; Never smoker; Marital Status - Single; Alcohol Use: Never; Drug Use: No History; Caffeine Use: Never; Financial Concerns: No; Food, Clothing or Shelter Needs: No; Support System Lacking: No; Transportation Concerns: No Electronic Signature(s) Signed: 06/09/2022 3:54:15 PM By: Kalman Shan DO Entered By: Kalman Shan on 06/09/2022 15:22:12 -------------------------------------------------------------------------------- SuperBill Details Patient Name: Date of Service: DO [name], [name] 06/09/2022 Medical Record Number: [id number] Patient Account Number: [id number] Date of Birth/Sex: Treating RN: 2000-06-21 (22 y.o. F) Primary Care  Provider: [name] Other Clinician: Referring Provider: Treating Provider/Extender: Claudie Leach in Treatment: 2 Diagnosis Coding ICD-10 Codes Code Description [id number] Non-pressure chronic ulcer of right thigh with fat layer exposed F42.4 Excoriation (skin-picking) disorder Q87.11 Prader-Willi syndrome Z68.45 Body mass index [BMI] 70 or greater, adult E11.65 Type 2 diabetes mellitus with hyperglycemia Facility Procedures : CPT4 Code: [id number] Description: 99213 - WOUND CARE VISIT-LEV 3 EST PT Modifier: Quantity: 1 Physician Procedures : CPT4 Code Description Modifier [id number] - WC PHYS LEVEL 3 - EST PT ICD-10 Diagnosis Description [id number] Non-pressure chronic ulcer of right thigh with fat layer exposed F42.4 Excoriation (skin-picking) disorder Q87.11 Prader-Willi syndrome E11.65  Type 2 diabetes mellitus with hyperglycemia Quantity: 1 Electronic Signature(s) Signed: 06/09/2022 3:54:15 PM By: Kalman Shan DO Signed: 06/09/2022 4:45:55 PM By: Dellie Catholic RN Entered By: Dellie Catholic on 06/09/2022 15:38:24

## 2022-06-13 DIAGNOSIS — S71101A Unspecified open wound, right thigh, initial encounter: Secondary | ICD-10-CM | POA: Diagnosis not present

## 2022-06-14 ENCOUNTER — Other Ambulatory Visit (HOSPITAL_BASED_OUTPATIENT_CLINIC_OR_DEPARTMENT_OTHER): Payer: Self-pay

## 2022-06-15 ENCOUNTER — Other Ambulatory Visit (HOSPITAL_BASED_OUTPATIENT_CLINIC_OR_DEPARTMENT_OTHER): Payer: Self-pay

## 2022-06-16 ENCOUNTER — Other Ambulatory Visit (HOSPITAL_BASED_OUTPATIENT_CLINIC_OR_DEPARTMENT_OTHER): Payer: Self-pay

## 2022-06-23 ENCOUNTER — Other Ambulatory Visit: Payer: Self-pay

## 2022-06-23 ENCOUNTER — Other Ambulatory Visit (HOSPITAL_BASED_OUTPATIENT_CLINIC_OR_DEPARTMENT_OTHER): Payer: Self-pay

## 2022-06-23 ENCOUNTER — Encounter (HOSPITAL_BASED_OUTPATIENT_CLINIC_OR_DEPARTMENT_OTHER): Payer: 59 | Admitting: General Surgery

## 2022-06-23 DIAGNOSIS — S71101A Unspecified open wound, right thigh, initial encounter: Secondary | ICD-10-CM | POA: Diagnosis not present

## 2022-06-23 DIAGNOSIS — E1165 Type 2 diabetes mellitus with hyperglycemia: Secondary | ICD-10-CM | POA: Diagnosis not present

## 2022-06-23 DIAGNOSIS — L97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: Secondary | ICD-10-CM | POA: Diagnosis not present

## 2022-06-23 DIAGNOSIS — E11622 Type 2 diabetes mellitus with other skin ulcer: Secondary | ICD-10-CM | POA: Diagnosis not present

## 2022-06-23 DIAGNOSIS — Q8711 Prader-Willi syndrome: Secondary | ICD-10-CM | POA: Diagnosis not present

## 2022-06-23 NOTE — Progress Notes (Signed)
TIMIKA, SETARO ([id number]) [id number] Page 1 of 7 Visit Report for 06/23/2022 Chief Complaint Document Details Patient Name: Date of Service: DO [place], [name] 06/23/2022 9:30 A M Medical Record Number: [id number] Patient Account Number: [id number] Date of Birth/Sex: Treating RN: 2000-06-05 (23 y.o. F) Primary Care Provider: [name] Other Clinician: Referring Provider: Treating Provider/Extender: [name] in Treatment: 4 Information Obtained from: Patient Chief Complaint Patient seen for complaints of Non-Healing Wound. Electronic Signature(s) Signed: 06/23/2022 9:52:14 AM By: Fredirick Maudlin MD FACS Entered By: Fredirick Maudlin on 06/23/2022 09:52:14 -------------------------------------------------------------------------------- HPI Details Patient Name: Date of Service: DO [name], MA [name]. 06/23/2022 9:30 A M Medical Record Number: [id number] Patient Account Number: [id number] Date of Birth/Sex: Treating RN: March 25, 2001 (22 y.o. F) Primary Care Provider: [name] Other Clinician: Referring Provider: Treating Provider/Extender: [name] in Treatment: 4 History of Present Illness HPI Description: ADMISSION 05/23/2022 This is a 22 year old young woman with Prader-Willi syndrome and the usual accompanying super morbid obesity, diabetes, and skin picking disorder. She picked a large wound in her right thigh that resulted in an emergency department visit on 17 February. Apparently her father was cleaning the wound with mupirocin and hydrogen peroxide. A culture was taken that grew out Proteus and methicillin sensitive Staph aureus. She was prescribed levofloxacin. She saw her primary care provider a few days later and based on the depth and appearance of the wound, she was referred to the wound care center for further evaluation and management. There is a large area on the patient's  upper right thigh that is excoriated and scaly with dry skin that has obviously been picked at. The primary wound is 4-1/2 cm in depth. It is much cleaner than described in the ED notes. There is also a linear ulcer adjacent to this deep site with slough accumulation. 3/15; patient presents for follow-up. She has been using iodoform packing followed by silver alginate to the wound bed. Mother is present during the encounter. There is been improvement in wound healing. 06/23/2022: The depth of the wound has come in further. The periwound skin is in improved condition with very few open areas. Electronic Signature(s) Signed: 06/23/2022 9:53:25 AM By: Fredirick Maudlin MD FACS Entered By: Fredirick Maudlin on 06/23/2022 09:53:25 -------------------------------------------------------------------------------- Physical Exam Details Patient Name: Date of Service: DO [name], MA [name]. 06/23/2022 9:30 A M Medical Record Number: [id number] Patient Account Number: [id number] Date of Birth/Sex: Treating RN: [birth year] (22 y.o. F) Primary Care Provider: [name] Other Clinician: Referring Provider: Treating Provider/Extender: [name] in Treatment: 4 Constitutional Hypertensive, asymptomatic. Slightly tachycardic. . . no acute distress. [name], [name] ([id number]) [id number] Page 2 of 7 Respiratory Normal work of breathing on room air. Notes 06/23/2022: The depth of the wound has come in further. The periwound skin is in improved condition with very few open areas. Electronic Signature(s) Signed: 06/23/2022 9:53:59 AM By: Fredirick Maudlin MD FACS Entered By: Fredirick Maudlin on 06/23/2022 09:53:59 -------------------------------------------------------------------------------- Physician Orders Details Patient Name: Date of Service: DO [name], MA [name]. 06/23/2022 9:30 A M Medical Record Number: [id number] Patient Account Number:  [id number] Date of Birth/Sex: Treating RN: 10/16/2000 (22 y.o. 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 - Dr. Celine Ahr - room 2 Bathing/ Shower/ Hygiene May shower and wash wound with soap and water. Wound Treatment Wound #1 - Upper Leg Wound Laterality: Right, Lateral Cleanser: Soap and Water 1 x Per Day/30 Days Discharge Instructions: May shower and wash wound with dial antibacterial soap and water prior to dressing change. Cleanser: Wound Cleanser (Generic) 1 x Per Day/30 Days Discharge Instructions: Cleanse the wound with wound cleanser prior to applying a clean dressing using gauze sponges, not tissue or cotton balls. Peri-Wound Care: Skin Prep (Generic) 1 x Per Day/30 Days Discharge Instructions: Use skin prep as directed Peri-Wound Care: Sween Lotion (Moisturizing lotion) 1 x Per Day/30 Days Discharge Instructions: Apply moisturizing lotion as directed Topical: Mupirocin Ointment 1 x Per Day/30 Days Discharge Instructions: Apply Mupirocin (Bactroban) as instructed Prim Dressing: Iodoform packing strip 1/2 (in) (Generic) 1 x Per Day/30 Days ary Discharge Instructions: Lightly pack as instructed Prim Dressing: Sorbalgon AG Dressing 2x2 (in/in) (Generic) 1 x Per Day/30 Days ary Discharge Instructions: Apply to wound bed as instructed Secondary Dressing: ABD Pad, 5x9 (Generic) 1 x Per Day/30 Days Discharge Instructions: Apply over primary dressing as directed. Secondary Dressing:  Woven Gauze Sponge, Non-Sterile 4x4 in (Generic) 1 x Per Day/30 Days Discharge Instructions: Apply over primary dressing as directed. Secured With: 25M Medipore H Soft Cloth Surgical T ape, 4 x 10 (in/yd) (Generic) 1 x Per Day/30 Days Discharge Instructions: Secure with tape as directed. [name], [name] ([id number]) [id number] Page 3 of 7 Electronic Signature(s) Signed: 06/23/2022 9:55:16 AM By: Fredirick Maudlin MD FACS Entered By: Fredirick Maudlin on 06/23/2022 09:55:15 -------------------------------------------------------------------------------- Problem List Details Patient Name: Date of Service: DO [name], MA [name]. 06/23/2022 9:30 A M Medical Record Number: [id number] Patient Account Number: [id number] Date of Birth/Sex: Treating RN: 02-27-2001 (22 y.o. 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 06/23/2022 9:30 A M Medical Record Number: [id number] Patient Account Number: [id number] Date of Birth/Sex: Treating RN: 25-Jun-2000 (22 y.o. F) Primary Care Provider: [name] Other Clinician: Referring  Provider: Treating Provider/Extender: [name] in Treatment: 4 Subjective Chief Complaint Information obtained from Patient Patient seen for complaints of Non-Healing Wound. History of Present Illness (HPI) ADMISSION 05/23/2022 This is a 22 year old young woman with Prader-Willi syndrome and the usual accompanying super morbid obesity, diabetes, and skin picking disorder. She picked a large wound in her right thigh that resulted in an emergency department visit on 17 February. Apparently her father was cleaning the wound with [name], [name] ([id number]) [id number] Page 4 of 7 mupirocin and hydrogen peroxide. A culture was taken that grew out Proteus and methicillin sensitive Staph aureus. She was prescribed levofloxacin. She saw her  primary care provider a few days later and based on the depth and appearance of the wound, she was referred to the wound care center for further evaluation and management. There is a large area on the patient's upper right thigh that is excoriated and scaly with dry skin that has obviously been picked at. The primary wound is 4-1/2 cm in depth. It is much cleaner than described in the ED notes. There is also a linear ulcer adjacent to this deep site with slough accumulation. 3/15; patient presents for follow-up. She has been using iodoform packing followed by silver alginate to the wound bed. Mother is present during the encounter. There is been improvement in wound healing. 06/23/2022: The depth of the wound has come in further. The periwound skin is in improved condition with very few open areas. Patient History Information obtained from Caregiver. Family History Hypertension - Father, Thyroid Problems - Father, No family history of Cancer, Diabetes, Heart Disease, Hereditary Spherocytosis, Kidney Disease, Lung Disease, Seizures, Stroke, Tuberculosis. Social History Never smoker, Marital Status -  Single, Alcohol Use - Never, Drug Use - No History, Caffeine Use - Never. Medical History Endocrine Patient has history of Type II Diabetes Hospitalization/Surgery History - knee surgery. - myringotomy. Medical A Surgical History Notes nd Constitutional Symptoms (General Health) obese Musculoskeletal scoliosis Neurologic Prader-willi syndrome Psychiatric compulsive skin picking Objective Constitutional Hypertensive, asymptomatic. Slightly tachycardic. no acute distress. Vitals Time Taken: 9:37 AM, Height: 60 in, Weight: 380 lbs, BMI: 74.2, Temperature: 97.4 F, Pulse: 103 bpm, Respiratory Rate: 18 breaths/min, Blood Pressure: 181/103 mmHg, Capillary Blood Glucose: 80 mg/dl. Respiratory Normal work of breathing on room air. General Notes: 06/23/2022: The depth of the wound has come in further. The periwound skin is in improved condition with very few open areas. Integumentary (Hair, Skin) Wound #1 status is Open. Original cause of wound was Trauma. The date acquired was: 04/07/2022. The wound has been in treatment 4 weeks. The wound is located on the Right,Lateral Upper Leg. The wound measures 0.4cm length x 1.4cm width x 3.8cm depth; 0.44cm^2 area and 1.671cm^3 volume. There is Fat Layer (Subcutaneous Tissue) exposed. There is no tunneling or undermining noted. There is a medium amount of serosanguineous drainage noted. The wound margin is distinct with the outline attached to the wound base. There is large (67-100%) red, pink granulation within the wound bed. There is no necrotic tissue within the wound bed. The periwound skin appearance had no abnormalities noted for color. The periwound skin appearance exhibited: Scarring, Dry/Scaly. Periwound temperature was noted as No Abnormality. 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 - Dr. Celine Ahr - room 2 Bathing/ Shower/ Hygiene: May shower and wash wound with soap and water. WOUND #1: - Upper Leg Wound Laterality: Right, Lateral Cleanser: Soap and Water 1 x Per Day/30 Days Discharge Instructions: May shower and wash wound with dial antibacterial soap and water prior to dressing change. Cleanser: Wound Cleanser (Generic) 1 x Per Day/30 Days Discharge Instructions: Cleanse the wound with wound cleanser prior to applying a clean dressing using gauze sponges, not tissue or cotton balls. Peri-Wound Care: Skin Prep (Generic) 1 x Per Day/30 Days Discharge Instructions: Use skin prep as directed Peri-Wound Care: Sween Lotion (Moisturizing lotion) 1 x Per Day/30 Days  Discharge Instructions: Apply moisturizing lotion as directed Topical: Mupirocin Ointment 1 x Per Day/30 Days Discharge Instructions: Apply Mupirocin (Bactroban) as instructed Prim Dressing: Iodoform packing strip 1/2 (in) (Generic) 1 x Per Day/30 Days ary Discharge Instructions: Lightly pack as instructed Prim Dressing: Sorbalgon AG Dressing 2x2 (in/in) (Generic) 1 x Per Day/30 Days ary Discharge Instructions: Apply to wound bed as instructed Secondary Dressing: ABD Pad, 5x9 (Generic) 1 x Per Day/30 Days Discharge Instructions: Apply over primary dressing as directed. Secondary Dressing: Woven Gauze Sponge, Non-Sterile 4x4 in (Generic) 1 x Per Day/30 Days Discharge Instructions: Apply over primary dressing as directed. Secured With: 69M Medipore H Soft Cloth Surgical T ape, 4 x 10 (in/yd) (Generic) 1 x Per Day/30 Days Discharge Instructions: Secure with tape as directed. 06/23/2022: The depth of the wound has come in further. The periwound skin is in improved condition with very few open areas. No debridement was  necessary today. Continue to pack the deep wound with iodoform packing strips. Apply silver alginate to any open areas that result from skin picking. Her father is doing an excellent job of managing the wound at home and I think we can extend her visits to once a month. If there are issues in the interim, he may call and we can arrange sooner follow-up. Electronic Signature(s) Signed: 06/23/2022 9:56:06 AM By: Fredirick Maudlin MD FACS Entered By: Fredirick Maudlin on 06/23/2022 09:56:05 -------------------------------------------------------------------------------- HxROS Details Patient Name: Date of Service: DO [name], MA [name]. 06/23/2022 9:30 A M Medical Record Number: [id number] Patient Account Number: [id number] Date of Birth/Sex: Treating RN: 2000/05/10 (22 y.o. F) Primary Care Provider: [name] Other Clinician: Referring Provider: Treating Provider/Extender: [name] in Treatment: 4 Information Obtained From Caregiver Constitutional Symptoms (General Health) Medical History: Past Medical History Notes: obese Endocrine Medical History: Positive for: Type II Diabetes Time with diabetes: 3 yrs Treated with: Insulin Blood sugar tested every day: Yes Tested : Musculoskeletal Medical History: Past Medical History Notes: scoliosis Neurologic [name], [name] ([id number]) [id number] Page 6 of 7 Medical History: Past Medical History Notes: Prader-willi syndrome Psychiatric Medical History: Past Medical History Notes: compulsive skin picking Immunizations Pneumococcal Vaccine: Received Pneumococcal Vaccination: No Implantable Devices None Hospitalization / Surgery History Type of Hospitalization/Surgery knee surgery myringotomy Family and Social History Cancer: No; Diabetes: No; Heart Disease: No; Hereditary Spherocytosis: No; Hypertension: Yes - Father; Kidney Disease: No; Lung Disease: No; Seizures: No;  Stroke: No; Thyroid Problems: Yes - Father; Tuberculosis: No; Never smoker; Marital Status - Single; Alcohol Use: Never; Drug Use: No History; Caffeine Use: Never; Financial Concerns: No; Food, Clothing or Shelter Needs: No; Support System Lacking: No; Transportation Concerns: No Electronic Signature(s) Signed: 06/23/2022 9:58:22 AM By: Fredirick Maudlin MD FACS Entered By: Fredirick Maudlin on 06/23/2022 09:53:32 -------------------------------------------------------------------------------- SuperBill Details Patient Name: Date of Service: DO [name], MA [name]. 06/23/2022 Medical Record Number: [id number] Patient Account Number: [id number] Date of Birth/Sex: Treating RN: January 10, 2001 (22 y.o. 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 Q87.11 Prader-Willi syndrome Z68.45 Body mass index [BMI] 70 or greater, adult E11.65 Type 2 diabetes mellitus with hyperglycemia Facility Procedures : CPT4 Code: [id number] Description: 99213 - WOUND CARE VISIT-LEV 3 EST PT Modifier: Quantity: 1 Physician Procedures : CPT4 Code Description Modifier [id number] - WC PHYS LEVEL  4 - EST PT ICD-10 Diagnosis Description [id number] 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

## 2022-06-24 ENCOUNTER — Other Ambulatory Visit (HOSPITAL_BASED_OUTPATIENT_CLINIC_OR_DEPARTMENT_OTHER): Payer: Self-pay

## 2022-06-24 NOTE — Progress Notes (Signed)
[name], [name] ([id number]) [id number] Page 1 of 8 Visit Report for 06/23/2022 Arrival Information Details Patient Name: Date of Service: DO [place], [name] 06/23/2022 9:30 A M Medical Record Number: [id number] Patient Account Number: [id number] Date of Birth/Sex: Treating RN: 07/15/2000 (22 y.o. [name], [name] Primary Care Marilouise Densmore: [name] Other Clinician: Referring Keighley Deckman: Treating Yardley Lekas/Extender: [name] in Treatment: 4 Visit Information History Since Last Visit Added or deleted any medications: No Patient Arrived: Gilford Rile Any new allergies or adverse reactions: No Arrival Time: 09:33 Had a fall or experienced change in No Accompanied By: dad activities of daily living that may affect Transfer Assistance: None risk of falls: Patient Identification Verified: Yes Signs or symptoms of abuse/neglect since last visito No Secondary Verification Process Completed: Yes Hospitalized since last visit: No Patient Requires Transmission-Based Precautions: No Implantable device outside of the clinic excluding No cellular tissue based products placed in the center since last visit: Has Dressing in Place as Prescribed: Yes Pain Present Now: Yes Electronic Signature(s) Signed: 06/23/2022 4:05:27 PM By: Adline Peals Entered By: Adline Peals on 06/23/2022 09:33:59 -------------------------------------------------------------------------------- Clinic Level of Care Assessment Details Patient Name: Date of Service: DO [name], [place] R. 06/23/2022 9:30 A M Medical Record Number: [id number] Patient Account Number: [id number] Date of Birth/Sex: Treating RN: 11/05/2000 (22 y.o. [name] Primary Care Jenipher Havel: [name] Other Clinician: Referring Capers Hagmann: Treating Saudia Smyser/Extender: [name] in Treatment: 4 Clinic Level of Care Assessment Items TOOL 4 Quantity  Score X- 1 0 Use when only an EandM is performed on FOLLOW-UP visit ASSESSMENTS - Nursing Assessment / Reassessment X- 1 10 Reassessment of Co-morbidities (includes updates in patient status) X- 1 5 Reassessment of Adherence to Treatment Plan ASSESSMENTS - Wound and Skin A ssessment / Reassessment X - Simple Wound Assessment / Reassessment - one wound 1 5 []  - 0 Complex Wound Assessment / Reassessment - multiple wounds []  - 0 Dermatologic / Skin Assessment (not related to wound area) ASSESSMENTS - Focused Assessment []  - 0 Circumferential Edema Measurements - multi extremities []  - 0 Nutritional Assessment / Counseling / Intervention []  - 0 Lower Extremity Assessment (monofilament, tuning fork, pulses) []  - 0 Peripheral Arterial Disease Assessment (using hand held doppler) ASSESSMENTS - Ostomy and/or Continence Assessment and Care []  - 0 Incontinence Assessment and Management []  - 0 Ostomy Care Assessment and Management (repouching, etc.) PROCESS - Coordination of Care X - Simple Patient / Family Education for ongoing care 1 15 [place], [place] ([id number]) [id number] Page 2 of 8 []  - 0 Complex (extensive) Patient / Family Education for ongoing care X- 1 10 Staff obtains Consents, Records, T Results / Process Orders est []  - 0 Staff telephones HHA, Nursing Homes / Clarify orders / etc []  - 0 Routine Transfer to another Facility (non-emergent condition) []  - 0 Routine Hospital Admission (non-emergent condition) []  - 0 New Admissions / Biomedical engineer / Ordering NPWT Apligraf, etc. , []  - 0 Emergency Hospital Admission (emergent condition) X- 1 10 Simple Discharge Coordination []  - 0 Complex (extensive) Discharge Coordination PROCESS - Special Needs []  - 0 Pediatric / Minor Patient Management []  - 0 Isolation Patient Management []  - 0 Hearing / Language / Visual special needs []  - 0 Assessment of Community assistance  (transportation, D/C planning, etc.) []  - 0 Additional assistance / Altered mentation []  - 0 Support Surface(s) Assessment (bed, cushion, seat, etc.) INTERVENTIONS - Wound Cleansing / Measurement X - Simple  Wound Cleansing - one wound 1 5 []  - 0 Complex Wound Cleansing - multiple wounds X- 1 5 Wound Imaging (photographs - any number of wounds) []  - 0 Wound Tracing (instead of photographs) X- 1 5 Simple Wound Measurement - one wound []  - 0 Complex Wound Measurement - multiple wounds INTERVENTIONS - Wound Dressings X - Small Wound Dressing one or multiple wounds 1 10 []  - 0 Medium Wound Dressing one or multiple wounds []  - 0 Large Wound Dressing one or multiple wounds []  - 0 Application of Medications - topical []  - 0 Application of Medications - injection INTERVENTIONS - Miscellaneous []  - 0 External ear exam []  - 0 Specimen Collection (cultures, biopsies, blood, body fluids, etc.) []  - 0 Specimen(s) / Culture(s) sent or taken to Lab for analysis []  - 0 Patient Transfer (multiple staff / Civil Service fast streamer / Similar devices) []  - 0 Simple Staple / Suture removal (25 or less) []  - 0 Complex Staple / Suture removal (26 or more) []  - 0 Hypo / Hyperglycemic Management (close monitor of Blood Glucose) []  - 0 Ankle / Brachial Index (ABI) - do not check if billed separately X- 1 5 Vital Signs Has the patient been seen at the hospital within the last three years: Yes Total Score: 85 Level Of Care: New/Established - Level 3 Electronic Signature(s) Signed: 06/23/2022 4:05:27 PM By: Adline Peals Entered By: Adline Peals on 06/23/2022 10:01:00 Mcelmurry, Chaunta R ([id number]) [id number] Page 3 of 8 -------------------------------------------------------------------------------- Encounter Discharge Information Details Patient Name: Date of Service: DO [name], [name] 06/23/2022 9:30 A M Medical Record Number: [id number] Patient Account  Number: [id number] Date of Birth/Sex: Treating RN: March 02, 2001 (22 y.o. [name] Primary Care Yanice Maqueda: [name] Other Clinician: Referring Bowyn Mercier: Treating Brandy Zuba/Extender: [name] in Treatment: 4 Encounter Discharge Information Items Discharge Condition: Stable Ambulatory Status: Walker Discharge Destination: Home Transportation: Private Auto Accompanied By: dad Schedule Follow-up Appointment: Yes Clinical Summary of Care: Patient Declined Electronic Signature(s) Signed: 06/23/2022 4:05:27 PM By: Adline Peals Entered By: Adline Peals on 06/23/2022 10:01:31 -------------------------------------------------------------------------------- Lower Extremity Assessment Details Patient Name: Date of Service: DO [name], Vangie Bicker R. 06/23/2022 9:30 A M Medical Record Number: [id number] Patient Account Number: [id number] Date of Birth/Sex: Treating RN: 01/07/2001 (22 y.o. [name] Primary Care Broughton Eppinger: [name] Other Clinician: Referring Greyden Besecker: Treating Rossi Silvestro/Extender: [name] in Treatment: 4 Electronic Signature(s) Signed: 06/23/2022 4:05:27 PM By: Sabas Sous By: Adline Peals on 06/23/2022 09:37:16 -------------------------------------------------------------------------------- Multi Wound Chart Details Patient Name: Date of Service: DO [name], [name] 06/23/2022 9:30 A M Medical Record Number: [id number] Patient Account Number: [id number] Date of Birth/Sex: Treating RN: [birth year] (22 y.o. F) Primary Care Dontai Pember: [name] Other Clinician: Referring Milicent Acheampong: Treating Jeda Pardue/Extender: [name] in Treatment: 4 Vital Signs Height(in): 60 Capillary Blood Glucose(mg/dl): 80 Weight(lbs): 380 Pulse(bpm): 103 Body Mass Index(BMI): 74.2 Blood Pressure(mmHg): 181/103 Temperature(F): 97.4 Respiratory Rate(breaths/min):  18 [1:Photos:] [N/A:N/A] GINNIFER, ONCALE ([id number]) [1:Right, Lateral Upper Leg Wound Location: Trauma Wounding Event: Trauma, Other Primary Etiology: Type II Diabetes Comorbid History: 04/07/2022 Date Acquired: 4 Weeks of Treatment: Open Wound Status: No Wound Recurrence: 0.4x1.4x3.8 Measurements L x W x  D (cm) 0.44 A (cm) : rea 1.671 Volume (cm) : 96.00% % Reduction in A rea: 96.90% % Reduction in Volume: Full Thickness Without Exposed Classification: Support Structures Medium Exudate Amount: Serosanguineous Exudate Type: red, brown Exudate Color:  Distinct,  outline attached Wound Margin: Large (67-100%) Granulation Amount: Red, Pink Granulation Quality: None Present (0%) Necrotic Amount: Fat Layer (Subcutaneous Tissue): Yes N/A Exposed Structures: Fascia: No Tendon: No Muscle: No Joint: No Bone:  No Small (1-33%) Epithelialization: Scarring: Yes Periwound Skin Texture: Dry/Scaly: Yes Periwound Skin Moisture: No Abnormalities Noted Periwound Skin Color: No Abnormality Temperature:] [N/A:N/A N/A N/A N/A N/A N/A N/A N/A N/A N/A N/A N/A N/A N/A N/A  N/A N/A N/A N/A N/A N/A N/A N/A N/A N/A N/A] Treatment Notes Electronic Signature(s) Signed: 06/23/2022 9:52:06 AM By: Fredirick Maudlin MD FACS Entered By: Fredirick Maudlin on 06/23/2022 09:52:06 -------------------------------------------------------------------------------- Multi-Disciplinary Care Plan Details Patient Name: Date of Service: DO [name], [name] 06/23/2022 9:30 A M Medical Record Number: [id number] Patient Account Number: [id number] Date of Birth/Sex: Treating RN: June 25, 2000 (22 y.o. [name] Primary Care Kenzlei Runions: [name] Other Clinician: Referring Tyton Abdallah: Treating Amiylah Anastos/Extender: [name] in Treatment: 4 Active Inactive Necrotic Tissue Nursing Diagnoses: Impaired tissue integrity related to necrotic/devitalized tissue Knowledge deficit related to management of  necrotic/devitalized tissue Goals: Necrotic/devitalized tissue will be minimized in the wound bed Date Initiated: 05/23/2022 Target Resolution Date: 11/30/2022 Goal Status: Active Patient/caregiver will verbalize understanding of reason and process for debridement of necrotic tissue Date Initiated: 05/23/2022 Target Resolution Date: 11/30/2022 Goal Status: Active Interventions: Assess patient pain level pre-, during and post procedure and prior to discharge Provide education on necrotic tissue and debridement process Treatment Activities: Apply topical anesthetic as ordered : 05/23/2022 Notes: MIRRA, MICHELLE ([id number]) [id number] Page 5 of 8 Wound/Skin Impairment Nursing Diagnoses: Impaired tissue integrity Knowledge deficit related to ulceration/compromised skin integrity Goals: Patient/caregiver will verbalize understanding of skin care regimen Date Initiated: 05/23/2022 Target Resolution Date: 11/30/2022 Goal Status: Active Interventions: Assess patient/caregiver ability to perform ulcer/skin care regimen upon admission and as needed Assess ulceration(s) every visit Treatment Activities: Skin care regimen initiated : 05/23/2022 Topical wound management initiated : 05/23/2022 Notes: Electronic Signature(s) Signed: 06/23/2022 4:05:27 PM By: Adline Peals Entered By: Adline Peals on 06/23/2022 09:49:03 -------------------------------------------------------------------------------- Pain Assessment Details Patient Name: Date of Service: DO [name], [name] 06/23/2022 9:30 A M Medical Record Number: [id number] Patient Account Number: [id number] Date of Birth/Sex: Treating RN: 04-30-2000 (22 y.o. [name] Primary Care Rush Salce: [name] Other Clinician: Referring Braxston Quinter: Treating Jrue Jarriel/Extender: [name] in Treatment: 4 Active Problems Location of Pain Severity and Description of Pain Patient  Has Paino Yes Site Locations Pain Location: Pain in Ulcers Pain Management and Medication Current Pain Management: Electronic Signature(s) Signed: 06/23/2022 4:05:27 PM By: Adline Peals Entered By: Adline Peals on 06/23/2022 09:34:11 -------------------------------------------------------------------------------- Patient/Caregiver Education Details Patient Name: Date of Service: DO [name], MA DISO Sherlene Shams 3/29/2024andnbsp9:30 A M [place], [place] ([id number]) [id number] Page 6 of 8 Medical Record Number: [id number] Patient Account Number: [id number] Date of Birth/Gender: Treating RN: [birth year] (22 y.o. [name] Primary Care Physician: [name] Other Clinician: Referring Physician: Treating Physician/Extender: [name] in Treatment: 4 Education Assessment Education Provided To: Patient Education Topics Provided Wound/Skin Impairment: Methods: Explain/Verbal Responses: Reinforcements needed, State content correctly Electronic Signature(s) Signed: 06/23/2022 4:05:27 PM By: Adline Peals Entered By: Adline Peals on 06/23/2022 09:49:13 -------------------------------------------------------------------------------- Wound Assessment Details Patient Name: Date of Service: DO [name], [name] 06/23/2022 9:30 A M Medical Record Number: [id number] Patient Account Number: [id number] Date of Birth/Sex: Treating RN: 07-24-2000 (22 y.o. [name] Primary Care Glennette Galster: [name]  Other Clinician: Referring Shelitha Magley: Treating Oswin Johal/Extender: [name] in Treatment: 4 Wound Status Wound Number: 1 Primary Etiology: Trauma, Other Wound Location: Right, Lateral Upper Leg Wound Status: Open Wounding Event: Trauma Comorbid History: Type II Diabetes Date Acquired: 04/07/2022 Weeks Of Treatment: 4 Clustered Wound: No Photos Wound Measurements Length: (cm)  0.4 Width: (cm) 1.4 Depth: (cm) 3.8 Area: (cm) 0.44 Volume: (cm) 1.671 % Reduction in Area: 96% % Reduction in Volume: 96.9% Epithelialization: Small (1-33%) Tunneling: No Undermining: No Wound Description Classification: Full Thickness Without Exposed Support Structures Wound Margin: Distinct, outline attached Exudate Amount: Medium Exudate Type: Serosanguineous Exudate Color: red, brown Foul Odor After Cleansing: No Slough/Fibrino Yes Wound Bed Granulation Amount: Large (67-100%) Exposed Khyla Troche, [place] ([id number]) [id number] Page 7 of 8 Granulation Quality: Red, Pink Fascia Exposed: No Necrotic Amount: None Present (0%) Fat Layer (Subcutaneous Tissue) Exposed: Yes Tendon Exposed: No Muscle Exposed: No Joint Exposed: No Bone Exposed: No Periwound Skin Texture Texture Color No Abnormalities Noted: No No Abnormalities Noted: Yes Scarring: Yes Temperature / Pain Temperature: No Abnormality Moisture No Abnormalities Noted: No Dry / Scaly: Yes Treatment Notes Wound #1 (Upper Leg) Wound Laterality: Right, Lateral Cleanser Soap and Water Discharge Instruction: May shower and wash wound with dial antibacterial soap and water prior to dressing change. Wound Cleanser Discharge Instruction: Cleanse the wound with wound cleanser prior to applying a clean dressing using gauze sponges, not tissue or cotton balls. Peri-Wound Care Skin Prep Discharge Instruction: Use skin prep as directed Sween Lotion (Moisturizing lotion) Discharge Instruction: Apply moisturizing lotion as directed Topical Mupirocin Ointment Discharge Instruction: Apply Mupirocin (Bactroban) as instructed Primary Dressing Iodoform packing strip 1/2 (in) Discharge Instruction: Lightly pack as instructed Sorbalgon AG Dressing 2x2 (in/in) Discharge Instruction: Apply to wound bed as instructed Secondary Dressing ABD Pad, 5x9 Discharge Instruction: Apply over  primary dressing as directed. Woven Gauze Sponge, Non-Sterile 4x4 in Discharge Instruction: Apply over primary dressing as directed. Secured With 39M Medipore H Soft Cloth Surgical T ape, 4 x 10 (in/yd) Discharge Instruction: Secure with tape as directed. Compression Wrap Compression Stockings Add-Ons Electronic Signature(s) Signed: 06/23/2022 4:05:27 PM By: Adline Peals Entered By: Adline Peals on 06/23/2022 09:44:51 -------------------------------------------------------------------------------- Vitals Details Patient Name: Date of Service: DO [name], [name] 06/23/2022 9:30 A M Medical Record Number: [id number] Patient Account Number: [id number] Date of Birth/Sex: Treating RN: 02-03-2001 (22 y.o. [name] Primary Care Clarece Drzewiecki: [name] Other Clinician: Referring Adabella Stanis: Treating Kadeja Granada/Extender: [name] in Treatment: [place], [place] ([id number]) [id number] Page 8 of 8 Vital Signs Time Taken: 09:37 Temperature (F): 97.4 Height (in): 60 Pulse (bpm): 103 Weight (lbs): 380 Respiratory Rate (breaths/min): 18 Body Mass Index (BMI): 74.2 Blood Pressure (mmHg): 181/103 Capillary Blood Glucose (mg/dl): 80 Reference Range: 80 - 120 mg / dl Electronic Signature(s) Signed: 06/23/2022 4:05:27 PM By: Adline Peals Entered By: Adline Peals on 06/23/2022 09:37:58

## 2022-06-26 ENCOUNTER — Other Ambulatory Visit (HOSPITAL_BASED_OUTPATIENT_CLINIC_OR_DEPARTMENT_OTHER): Payer: Self-pay

## 2022-06-26 ENCOUNTER — Other Ambulatory Visit: Payer: Self-pay

## 2022-06-27 ENCOUNTER — Encounter: Payer: Self-pay | Admitting: Nurse Practitioner

## 2022-06-27 DIAGNOSIS — T148XXA Other injury of unspecified body region, initial encounter: Secondary | ICD-10-CM

## 2022-06-28 ENCOUNTER — Telehealth: Payer: Self-pay | Admitting: Nurse Practitioner

## 2022-06-28 MED ORDER — MUPIROCIN 2 % EX OINT: TOPICAL_OINTMENT | CUTANEOUS | 6 refills | Status: DC

## 2022-06-28 NOTE — Telephone Encounter (Signed)
Contacted [name] to schedule their annual wellness visit. Appointment made for 07/03/22.  [name] AWV direct phone # [phone]

## 2022-07-03 ENCOUNTER — Ambulatory Visit (INDEPENDENT_AMBULATORY_CARE_PROVIDER_SITE_OTHER): Payer: 59

## 2022-07-03 VITALS — Ht 60.0 in | Wt 380.0 lb

## 2022-07-03 DIAGNOSIS — Z0000 Encounter for general adult medical examination without abnormal findings: Secondary | ICD-10-CM | POA: Diagnosis not present

## 2022-07-03 NOTE — Patient Instructions (Addendum)
Ms. Vendetti , Thank you for taking time to come for your Medicare Wellness Visit. I appreciate your ongoing commitment to your health goals. Please review the following plan we discussed and let me know if I can assist you in the future.   These are the goals we discussed:  Goals      Patient Stated     07/03/2022, weight loss        This is a list of the screening recommended for you and due dates:  Health Maintenance  Topic Date Due   Eye exam for diabetics  Never done   Yearly kidney health urinalysis for diabetes  Never done   DTaP/Tdap/Td vaccine (7 - Td or Tdap) 11/22/2021   COVID-19 Vaccine (4 - 2023-24 season) 01/04/2023*   Hemoglobin A1C  09/05/2022   Flu Shot  10/26/2022   Complete foot exam   03/07/2023   Yearly kidney function blood test for diabetes  05/13/2023   Medicare Annual Wellness Visit  07/03/2023   HPV Vaccine  Completed   Pap Smear  Discontinued   Hepatitis C Screening: USPSTF Recommendation to screen - Ages 18-79 yo.  Discontinued   HIV Screening  Discontinued  *Topic was postponed. The date shown is not the original due date.    Advanced directives: patient has legal guardians  Conditions/risks identified: none  Next appointment: Follow up in one year for your annual wellness visit.   Preventive Care 64-28 Years Old, Female Preventive care refers to lifestyle choices and visits with your health care provider that can promote health and wellness. Preventive care visits are also called wellness exams. What can I expect for my preventive care visit? Counseling During your preventive care visit, your health care provider may ask about your: Medical history, including: Past medical problems. Family medical history. Pregnancy history. Current health, including: Menstrual cycle. Method of birth control. Emotional well-being. Home life and relationship well-being. Sexual activity and sexual health. Lifestyle, including: Alcohol, nicotine or tobacco,  and drug use. Access to firearms. Diet, exercise, and sleep habits. Work and work Astronomer. Sunscreen use. Safety issues such as seatbelt and bike helmet use. Physical exam Your health care provider may check your: Height and weight. These may be used to calculate your BMI (body mass index). BMI is a measurement that tells if you are at a healthy weight. Waist circumference. This measures the distance around your waistline. This measurement also tells if you are at a healthy weight and may help predict your risk of certain diseases, such as type 2 diabetes and high blood pressure. Heart rate and blood pressure. Body temperature. Skin for abnormal spots. What immunizations do I need? Vaccines are usually given at various ages, according to a schedule. Your health care provider will recommend vaccines for you based on your age, medical history, and lifestyle or other factors, such as travel or where you work. What tests do I need? Screening Your health care provider may recommend screening tests for certain conditions. This may include: Pelvic exam and Pap test. Lipid and cholesterol levels. Diabetes screening. This is done by checking your blood sugar (glucose) after you have not eaten for a while (fasting). Hepatitis B test. Hepatitis C test. HIV (human immunodeficiency virus) test. STI (sexually transmitted infection) testing, if you are at risk. BRCA-related cancer screening. This may be done if you have a family history of breast, ovarian, tubal, or peritoneal cancers. Talk with your health care provider about your test results, treatment options, and if necessary,  the need for more tests. Follow these instructions at home: Eating and drinking  Eat a healthy diet that includes fresh fruits and vegetables, whole grains, lean protein, and low-fat dairy products. Take vitamin and mineral supplements as recommended by your health care provider. Do not drink alcohol if: Your health  care provider tells you not to drink. You are pregnant, may be pregnant, or are planning to become pregnant. If you drink alcohol: Limit how much you have to 0-1 drink a day. Know how much alcohol is in your drink. In the [place] one drink equals one 12 oz bottle of beer (355 mL), one 5 oz glass of wine (148 mL), or one 1 oz glass of hard liquor (44 mL). Lifestyle Brush your teeth every morning and night with fluoride toothpaste. Floss one time each day. Exercise for at least 30 minutes 5 or more days each week. Do not use any products that contain nicotine or tobacco. These products include cigarettes, chewing tobacco, and vaping devices, such as e-cigarettes. If you need help quitting, ask your health care provider. Do not use drugs. If you are sexually active, practice safe sex. Use a condom or other form of protection to prevent STIs. If you do not wish to become pregnant, use a form of birth control. If you plan to become pregnant, see your health care provider for a prepregnancy visit. Find healthy ways to manage stress, such as: Meditation, yoga, or listening to music. Journaling. Talking to a trusted person. Spending time with friends and family. Minimize exposure to UV radiation to reduce your risk of skin cancer. Safety Always wear your seat belt while driving or riding in a vehicle. Do not drive: If you have been drinking alcohol. Do not ride with someone who has been drinking. If you have been using any mind-altering substances or drugs. While texting. When you are tired or distracted. Wear a helmet and other protective equipment during sports activities. If you have firearms in your house, make sure you follow all gun safety procedures. Seek help if you have been physically or sexually abused. What's next? Go to your health care provider once a year for an annual wellness visit. Ask your health care provider how often you should have your eyes and teeth checked. Stay up to  date on all vaccines. This information is not intended to replace advice given to you by your health care provider. Make sure you discuss any questions you have with your health care provider. Document Revised: 09/08/2020 Document Reviewed: 09/08/2020 Elsevier Patient Education  [place].

## 2022-07-03 NOTE — Progress Notes (Signed)
I connected with  [name] on 07/03/22 by a audio enabled telemedicine application and verified that I am speaking with the correct person using two identifiers. Father on call.  Patient Location: Home  Provider Location: Office/Clinic  I discussed the limitations of evaluation and management by telemedicine. The patient expressed understanding and agreed to proceed.  Subjective:   [name] is a 22 y.o. female who presents for an Initial Medicare Annual Wellness Visit.  Review of Systems     Cardiac Risk Factors include: diabetes mellitus;obesity (BMI >30kg/m2)     Objective:    Today's Vitals   07/03/22 1527 07/03/22 1528  Weight: (!) 380 lb (172.4 kg)   Height: 5' (1.524 m)   PainSc:  6    Body mass index is 74.21 kg/m.     07/03/2022    3:35 PM 05/12/2022    9:01 PM 08/03/2020   12:33 PM 12/21/2013    8:54 AM  Advanced Directives  Does Patient Have a Medical Advance Directive? Yes No No No  Type of Advance Directive Healthcare Power of Attorney     Would patient like information on creating a medical advance directive?  No - Patient declined No - Guardian declined No - patient declined information    Current Medications (verified) Outpatient Encounter Medications as of 07/03/2022  Medication Sig   Accu-Chek FastClix Lancets MISC SMARTSIG:Topical 6 Times Daily   ACCU-CHEK GUIDE test strip USE TO CHECK BLOOD SUGAR UP TO 6 TIMES DAILY AS DIRECTED   AMBULATORY NON FORMULARY MEDICATION Rolling walker with seat and hand breaks for ambulatory assistance based on insurance coverage and patient fit.   atorvastatin (LIPITOR) 20 MG tablet Take 1 tablet (20 mg total) by mouth at bedtime.   B-D UF III MINI PEN NEEDLES 31G X 5 MM MISC SMARTSIG:Injection   insulin glargine (LANTUS SOLOSTAR) 100 UNIT/ML Solostar Pen Inject 30 Units into the skin daily.   mometasone (ELOCON) 0.1 % cream Apply to areas of itching twice a day. (Patient taking differently: Apply 1  Application topically daily as needed (itching).)   mupirocin ointment (BACTROBAN) 2 % APPLY TOPICALLY TO THE AFFECTED AREA 2 TO 3 TIMES DAILY AS NEEDED   sertraline (ZOLOFT) 100 MG tablet Take 2 tablets (200mg ) by mouth at bedtime for anxiety, picking, and compulsive disorders.   tirzepatide (MOUNJARO) 12.5 MG/0.5ML Pen Inject 12.5 mg into the skin once a week.   traMADol (ULTRAM) 50 MG tablet Take 1 tablet (50 mg total) by mouth at bedtime as needed for severe pain.   Vitamin D, Ergocalciferol, (DRISDOL) 1.25 MG (50000 UNIT) CAPS capsule Take 1 capsule (50,000 Units total) by mouth every 7 (seven) days.   fluconazole (DIFLUCAN) 150 MG tablet Take 1 tablet as needed for vaginal yeast infection.  May repeat in 3 days if symptoms persist. (Patient not taking: Reported on 07/03/2022)   levofloxacin (LEVAQUIN) 750 MG tablet Take 1 tablet daily starting 05/14/2022. (Patient not taking: Reported on 07/03/2022)   No facility-administered encounter medications on file as of 07/03/2022.    Allergies (verified) Latex   History: Past Medical History:  Diagnosis Date   Body mass index (BMI) of 70 or greater in adult 07/20/2020   DM (diabetes mellitus), type 2, uncontrolled with complications    Open wound of skin 07/20/2020   Pain in joint involving pelvic region and thigh, bilateral 07/20/2020   Prader-Willi syndrome    Scoliosis    Tibia vara of left lower extremity  Past Surgical History:  Procedure Laterality Date   KNEE SURGERY     MYRINGOTOMY Bilateral    Family History  Problem Relation Age of Onset   Hypertension Father    Heart disease Maternal Grandmother    Cancer Paternal Grandfather    Social History   Socioeconomic History   Marital status: Single    Spouse name: Not on file   Number of children: Not on file   Years of education: Not on file   Highest education level: Not on file  Occupational History   Not on file  Tobacco Use   Smoking status: Never   Smokeless  tobacco: Never  Vaping Use   Vaping Use: Never used  Substance and Sexual Activity   Alcohol use: No   Drug use: Never   Sexual activity: Never  Other Topics Concern   Not on file  Social History Narrative   Not on file   Social Determinants of Health   Financial Resource Strain: Low Risk  (07/03/2022)   Overall Financial Resource Strain (CARDIA)    Difficulty of Paying Living Expenses: Not hard at all  Food Insecurity: No Food Insecurity (07/03/2022)   Hunger Vital Sign    Worried About Running Out of Food in the Last Year: Never true    Ran Out of Food in the Last Year: Never true  Transportation Needs: No Transportation Needs (07/03/2022)   PRAPARE - Administrator, Civil Service (Medical): No    Lack of Transportation (Non-Medical): No  Physical Activity: Inactive (07/03/2022)   Exercise Vital Sign    Days of Exercise per Week: 0 days    Minutes of Exercise per Session: 0 min  Stress: Stress Concern Present (07/03/2022)   [employer] of Occupational Health - Occupational Stress Questionnaire    Feeling of Stress : To some extent  Social Connections: Moderately Isolated (08/30/2020)   Social Connection and Isolation Panel [NHANES]    Frequency of Communication with Friends and Family: More than three times a week    Frequency of Social Gatherings with Friends and Family: More than three times a week    Attends Religious Services: Never    Database administrator or Organizations: Yes    Attends Engineer, structural: More than 4 times per year    Marital Status: Never married    Tobacco Counseling Counseling given: Not Answered   Clinical Intake:  Pre-visit preparation completed: Yes  Pain : 0-10 Pain Score: 6  Pain Type: Chronic pain Pain Location: Generalized Pain Descriptors / Indicators: Aching Pain Onset: More than a month ago Pain Frequency: Constant     Nutritional Status: BMI > 30  Obese Nutritional Risks: None Diabetes: Yes  How  often do you need to have someone help you when you read instructions, pamphlets, or other written materials from your doctor or pharmacy?: 5 - Always  Diabetic? Yes Nutrition Risk Assessment:  Has the patient had any N/V/D within the last 2 months?  No  Does the patient have any non-healing wounds?  No  Has the patient had any unintentional weight loss or weight gain?  Yes   Diabetes:  Is the patient diabetic?  Yes  If diabetic, was a CBG obtained today?  No  Did the patient bring in their glucometer from home?  No  How often do you monitor your CBG's? nightly.   Financial Strains and Diabetes Management:  Are you having any financial strains with the device, your  supplies or your medication? No .  Does the patient want to be seen by Chronic Care Management for management of their diabetes?  No  Would the patient like to be referred to a Nutritionist or for Diabetic Management?  No   Diabetic Exams:  Diabetic Eye Exam: Overdue for diabetic eye exam. Pt has been advised about the importance in completing this exam. Patient advised to call and schedule an eye exam. Diabetic Foot Exam: Completed 03/06/2022   Interpreter Needed?: No  Information entered by :: NAllen LPN   Activities of Daily Living    07/03/2022    3:38 PM 12/23/2021    3:03 PM  In your present state of health, do you have any difficulty performing the following activities:  Hearing? 0 0  Vision? 0 0  Difficulty concentrating or making decisions? 1 1  Walking or climbing stairs? 0 1  Comment needs motivation to go up stairs   Dressing or bathing? 1 1  Doing errands, shopping? 1 1  Preparing Food and eating ? Y   Using the Toilet? N   In the past six months, have you accidently leaked urine? Y   Do you have problems with loss of bowel control? Y   Managing your Medications? Y   Managing your Finances? Y   Housekeeping or managing your Housekeeping? Y     Patient Care Team: Early, Sung Amabile, NP as PCP -  General (Nurse Practitioner)  Indicate any recent Medical Services you may have received from other than [facility] providers in the past year (date may be approximate).     Assessment:   This is a routine wellness examination for [place].  Hearing/Vision screen Vision Screening - Comments:: Regular eye exams, [facility]  Dietary issues and exercise activities discussed: Current Exercise Habits: The patient does not participate in regular exercise at present   Goals Addressed             This Visit's Progress    Patient Stated       07/03/2022, weight loss       Depression Screen    07/03/2022    3:38 PM 12/23/2021    3:02 PM 07/12/2021    9:53 AM 07/20/2020   11:31 AM  PHQ 2/9 Scores  PHQ - 2 Score   0 0  PHQ- 9 Score    0  Exception Documentation Medical reason Other- indicate reason in comment box Medical reason   Not completed  Patient mental status.      Fall Risk    07/03/2022    3:37 PM 12/23/2021    3:02 PM 07/12/2021    9:52 AM 07/20/2020   10:27 AM  Fall Risk   Falls in the past year? 1 0 0 0  Comment slips in the bathtub     Number falls in past yr: 1 0 0 0  Injury with Fall? 0 0 0   Risk for fall due to : Impaired mobility;Impaired balance/gait;Medication side effect Impaired balance/gait;Impaired mobility  No Fall Risks  Follow up Falls prevention discussed;Education provided;Falls evaluation completed Education provided;Falls evaluation completed Falls evaluation completed;Education provided     FALL RISK PREVENTION PERTAINING TO THE HOME:  Any stairs in or around the home? Yes  If so, are there any without handrails? No  Home free of loose throw rugs in walkways, pet beds, electrical cords, etc? Yes  Adequate lighting in your home to reduce risk of falls? Yes   ASSISTIVE DEVICES UTILIZED  TO PREVENT FALLS:  Life alert? No  Use of a cane, walker or w/c? Yes  Grab bars in the bathroom? Yes  Shower chair or bench in shower? Yes  Elevated toilet seat or  a handicapped toilet? Yes   TIMED UP AND GO:  Was the test performed? No .      Cognitive Function:  6 CIT not administered. Patient has Prader- Willi Syndrome.        Immunizations Immunization History  Administered Date(s) Administered   DTaP 08/07/2000, 12/18/2000, 08/15/2001, 10/01/2002, 05/05/2005   H1N1 02/26/2008   HIB (PRP-T) 08/07/2000, 12/18/2000, 08/15/2001, 10/01/2002   HPV 9-valent 02/08/2015, 01/31/2016, 01/19/2017   Hepatitis A, Ped/Adol-2 Dose 05/30/2007, 02/26/2008   Hepatitis B, PED/ADOLESCENT 02/06/2000, 08/07/2000, 12/18/2000   IPV 08/07/2000, 12/18/2000, 08/15/2001, 05/05/2005   Influenza Nasal 02/26/2008, 01/20/2009, 11/23/2011   Influenza Split 05/30/2007, 02/02/2010   Influenza, Quadrivalent, Recombinant, Inj, Pf 01/31/2016, 01/11/2017, 01/17/2018   Influenza, Seasonal, Injecte, Preservative Fre 05/11/2014, 02/08/2015   Influenza,Quad,Nasal, Live 02/24/2013   Influenza,inj,Quad PF,6+ Mos 05/11/2014, 02/08/2015, 01/31/2016, 01/11/2017, 01/17/2018, 01/03/2022   Influenza,inj,Quad PF,6-35 Mos 01/11/2017, 01/17/2018   Influenza,inj,quad, With Preservative 04/29/2003   Influenza,trivalent, recombinat, inj, PF 05/30/2007, 02/02/2010   Influenza-Unspecified 04/29/2003   Janssen (J&J) SARS-COV-2 Vaccination 07/03/2019   MMR 08/15/2001, 05/05/2005   Meningococcal B, OMV 01/31/2016, 01/19/2017   Meningococcal Conjugate 11/23/2011, 01/31/2016   PFIZER(Purple Top)SARS-COV-2 Vaccination 03/03/2020   Pfizer Covid-19 Vaccine Bivalent Booster 28yrs & up 01/03/2022   Pneumococcal-Unspecified 08/07/2000, 12/18/2000, 08/15/2001   Tdap 11/23/2011   Varicella 08/15/2001, 05/05/2005    TDAP status: Up to date  Flu Vaccine status: Up to date  Pneumococcal vaccine status: Up to date  Covid-19 vaccine status: Completed vaccines  Qualifies for Shingles Vaccine? No   Zostavax completed  n/a   Shingrix Completed?: Yes  Screening Tests Health Maintenance  Topic  Date Due   Medicare Annual Wellness (AWV)  Never done   OPHTHALMOLOGY EXAM  Never done   Diabetic kidney evaluation - Urine ACR  Never done   DTaP/Tdap/Td (7 - Td or Tdap) 11/22/2021   COVID-19 Vaccine (4 - 2023-24 season) 01/04/2023 (Originally 02/28/2022)   HEMOGLOBIN A1C  09/05/2022   INFLUENZA VACCINE  10/26/2022   FOOT EXAM  03/07/2023   Diabetic kidney evaluation - eGFR measurement  05/13/2023   HPV VACCINES  Completed   PAP-Cervical Cytology Screening  Discontinued   Hepatitis C Screening  Discontinued   HIV Screening  Discontinued    Health Maintenance  Health Maintenance Due  Topic Date Due   Medicare Annual Wellness (AWV)  Never done   OPHTHALMOLOGY EXAM  Never done   Diabetic kidney evaluation - Urine ACR  Never done   DTaP/Tdap/Td (7 - Td or Tdap) 11/22/2021    Colorectal cancer screening: No longer required.   Mammogram status: No longer required due to age.  Bone Density status: n/a  Lung Cancer Screening: (Low Dose CT Chest recommended if Age 90-80 years, 30 pack-year currently smoking OR have quit w/in 15years.) does not qualify.   Lung Cancer Screening Referral: no  Additional Screening:  Hepatitis C Screening: does qualify;  Vision Screening: Recommended annual ophthalmology exams for early detection of glaucoma and other disorders of the eye. Is the patient up to date with their annual eye exam?  Yes  Who is the provider or what is the name of the office in which the patient attends annual eye exams? [facility] If pt is not established with a provider, would they  like to be referred to a provider to establish care? No .   Dental Screening: Recommended annual dental exams for proper oral hygiene  Community Resource Referral / Chronic Care Management: CRR required this visit?  No   CCM required this visit?  No      Plan:     I have personally reviewed and noted the following in the patient's chart:   Medical and social history Use of  alcohol, tobacco or illicit drugs  Current medications and supplements including opioid prescriptions. Patient is not currently taking opioid prescriptions. Functional ability and status Nutritional status Physical activity Advanced directives List of other physicians Hospitalizations, surgeries, and ER visits in previous 12 months Vitals Screenings to include cognitive, depression, and falls Referrals and appointments  In addition, I have reviewed and discussed with patient certain preventive protocols, quality metrics, and best practice recommendations. A written personalized care plan for preventive services as well as general preventive health recommendations were provided to patient.     Barb Merinoickeah E Judy Goodenow, LPN   1/6/10964/10/2022   Nurse Notes: none  Due to this being a virtual visit, the after visit summary with patients personalized plan was offered to patient via mail or my-chart.  Patient would like to access on my-chart

## 2022-07-06 ENCOUNTER — Ambulatory Visit (INDEPENDENT_AMBULATORY_CARE_PROVIDER_SITE_OTHER): Payer: 59 | Admitting: Nurse Practitioner

## 2022-07-06 ENCOUNTER — Encounter: Payer: Self-pay | Admitting: Nurse Practitioner

## 2022-07-06 VITALS — BP 130/80 | HR 100 | Wt 387.6 lb

## 2022-07-06 DIAGNOSIS — M4100 Infantile idiopathic scoliosis, site unspecified: Secondary | ICD-10-CM | POA: Diagnosis not present

## 2022-07-06 DIAGNOSIS — Z794 Long term (current) use of insulin: Secondary | ICD-10-CM

## 2022-07-06 DIAGNOSIS — F424 Excoriation (skin-picking) disorder: Secondary | ICD-10-CM

## 2022-07-06 DIAGNOSIS — E1165 Type 2 diabetes mellitus with hyperglycemia: Secondary | ICD-10-CM | POA: Diagnosis not present

## 2022-07-06 DIAGNOSIS — Q8711 Prader-Willi syndrome: Secondary | ICD-10-CM

## 2022-07-06 DIAGNOSIS — R030 Elevated blood-pressure reading, without diagnosis of hypertension: Secondary | ICD-10-CM | POA: Diagnosis not present

## 2022-07-06 DIAGNOSIS — Z6841 Body Mass Index (BMI) 40.0 and over, adult: Secondary | ICD-10-CM | POA: Diagnosis not present

## 2022-07-06 DIAGNOSIS — R32 Unspecified urinary incontinence: Secondary | ICD-10-CM

## 2022-07-06 MED ORDER — TOUJEO MAX SOLOSTAR 300 UNIT/ML ~~LOC~~ SOPN: 30.0000 [IU] | PEN_INJECTOR | Freq: Every day | SUBCUTANEOUS | 1 refills | Status: DC

## 2022-07-06 MED ORDER — OXYBUTYNIN CHLORIDE ER 5 MG PO TB24: 5.0000 mg | ORAL_TABLET | Freq: Every day | ORAL | 3 refills | Status: DC

## 2022-07-06 NOTE — Progress Notes (Signed)
[name], DNP, AGNP-c [facility]  [address] [place], [place] [address] [phone]  ESTABLISHED PATIENT- Chronic Health and/or Follow-Up Visit  Blood pressure 130/80, pulse 100, weight (!) 387 lb 9.6 oz (175.8 kg).    ILONA COLLEY is a 22 y.o. year old female presenting today for evaluation and management of chronic conditions.   [name] and her father, Zella Ball, present today with updates on the patient's condition and several concerns regarding the patient's ongoing care.   Zella Ball reports that the wound on [name]'s thigh is healing, albeit slowly, with the frequency of wound care visits having been reduced from bi-weekly to monthly. Despite this progress, expresses concern about the pace of healing, attributing it to the patient's diabetes. For wound care, the patient is currently using Mupirocin and Silvadene.   Zella Ball is inquiring about the possibility of adjusting the patient's Lantus prescription, either to a higher concentration or a longer-lasting formulation, due to the patient experiencing nocturnal enuresis. He specifically requests Toujeo.   Additionally, Zella Ball reports that [name] is having difficulty walking, with pain in her knee that is worsened by her weight. There is a history of consultations with orthopedic doctors without significant change. He is interested in exploring the option of diabetic shoes and inserts to alleviate discomfort and support mobility. He notes that weight management has been progressively more difficult and recognizes that this may not be possible.   Zella Ball also notes that [name] is suffering from increased urinary frequency, which has led to incontinence issues, particularly on the stairs and in the bedroom. Although adult pull-ups have been tried, they have proven ineffective due to the patient's size and the volume of urine. These are also not covered under insurance. We discussed the option of a portable toileting chair  to place downstairs in a private area, however, he does not feel that his wife would allow for this.   Despite these concerns, the caregiver expresses pride in the patient's progress with managing blood sugars, which have been consistently good. The patient herself does not voice any specific complaints or concerns during the visit.  All ROS negative with exception of what is listed above.   PHYSICAL EXAM Physical Exam Vitals and nursing note reviewed.  Constitutional:      Appearance: Normal appearance. She is obese.  HENT:     Head: Normocephalic.  Eyes:     Pupils: Pupils are equal, round, and reactive to light.  Neck:     Vascular: No carotid bruit.  Cardiovascular:     Rate and Rhythm: Normal rate and regular rhythm.     Pulses: Normal pulses.     Heart sounds: Normal heart sounds.  Pulmonary:     Effort: Pulmonary effort is normal.     Breath sounds: Normal breath sounds.  Abdominal:     General: There is no distension.     Palpations: Abdomen is soft.     Tenderness: There is no abdominal tenderness. There is no right CVA tenderness, left CVA tenderness or guarding.  Musculoskeletal:     Right lower leg: Edema present.     Left lower leg: Edema present.  Skin:    General: Skin is warm and dry.     Capillary Refill: Capillary refill takes less than 2 seconds.  Neurological:     Mental Status: She is alert. Mental status is at baseline.     Motor: Weakness present.     Coordination: Coordination abnormal.     Gait: Gait abnormal.  Psychiatric:        Mood and Affect: Mood normal.     PLAN Problem List Items Addressed This Visit     Prader-Willi syndrome    Chronic ongoing concerns related to Jeanella Cara include management of weight and [name]'s diet. Unfortunately, her household has multiple family members and her siblings are not consistent in keeping food out of reach or resisting giving her snacks. This, along with sneaking food is certainly contributing to  her overall obesity and difficulty with management of her blood sugars. This inconsistency is concerning as it clearly is affecting her physically. She has had a 23lb weight gain over the past year with a current BMI of 75.7.  Currently, [name]'s skin picking behaviors seem to be improved. She is still healing from the large wound on her thigh.  Plan: -Recommend strict limit setting and reinforcement of meal times and snacks for [name] from all members of the household. Food should be consistently locked to prevent sneaking and compulsive eating behaviors.  - Daily exercise is vital to help control her weight and keep her heart healthy. She is very limited due to her pain and weight, but water exercises would be very beneficial for her.        Relevant Medications   insulin glargine, 2 Unit Dial, (TOUJEO MAX SOLOSTAR) 300 UNIT/ML Solostar Pen   oxybutynin (DITROPAN XL) 5 MG 24 hr tablet   Other Relevant Orders   For Home Use Only DME Diabetic Shoe   For home use only DME Other see comment   For home use only DME Other see comment   Hemoglobin A1c (Completed)   CBC with Differential/Platelet (Completed)   Comprehensive metabolic panel (Completed)   TSH (Completed)   Insulin, Free and Total (Completed)   Compulsive skin picking    [name] appears to have slowed on her skin picking since the large infected wound on her leg. I am hopeful that the sertraline is helping to reduce these occurrences and prevent this from recurring.  Plan: - Monitor closely for skin picking behaviors.  - Redirect [name] when she is seen picking       Relevant Medications   insulin glargine, 2 Unit Dial, (TOUJEO MAX SOLOSTAR) 300 UNIT/ML Solostar Pen   oxybutynin (DITROPAN XL) 5 MG 24 hr tablet   Other Relevant Orders   For Home Use Only DME Diabetic Shoe   For home use only DME Other see comment   For home use only DME Other see comment   Hemoglobin A1c (Completed)   CBC with Differential/Platelet  (Completed)   Comprehensive metabolic panel (Completed)   TSH (Completed)   Insulin, Free and Total (Completed)   Uncontrolled type 2 diabetes mellitus with hyperglycemia, with long-term current use of insulin - Primary    [name]'s blood sugars are reportedly improved with use of insulin and Mounjaro. She does have reported nocturia and urinary incontinence, which could be related to blood sugar spikes outside of testing times.- Remind [name] to use the restroom immediately before bed.       Relevant Medications   insulin glargine, 2 Unit Dial, (TOUJEO MAX SOLOSTAR) 300 UNIT/ML Solostar Pen   oxybutynin (DITROPAN XL) 5 MG 24 hr tablet   BMI 70 and over, adult    [name] has had a 26lb weight gain in the last 12 months, which is concerning for her overall health. Weight management is challenged with compulsive eating associated with Prader-Willi, joint pain and limitations from movement due to size,  inconsistencies in keeping access to food limited to meals only, genetic predisposition, insulin use, and patients understanding of importance for weight management.  At this time, my concern is that her mobility is becoming limited to the point that we may not be able to incorporate exercise if she continues to gain. This could be detrimental.  Plan: - Very strict diet recommended with 3 meals and 2 snacks per day. Keep total daily carbohydrates less than 150 grams and protein intake at least 60 grams. Avoid juice, soda, sweet tea, etc as these only provide empty calories and add to carbohydrate count.  - Walk 10 minutes every day. Encourage [name] to dance to YouTube videos to help get her moving.  - Keep food locked and out of her reach to prevent binging and overeating. Speak with other members of the household on the importance of this for [name]'s health and wellbeing.       Relevant Medications   insulin glargine, 2 Unit Dial, (TOUJEO MAX SOLOSTAR) 300 UNIT/ML Solostar Pen   oxybutynin  (DITROPAN XL) 5 MG 24 hr tablet   Other Relevant Orders   For Home Use Only DME Diabetic Shoe   For home use only DME Other see comment   For home use only DME Other see comment   Hemoglobin A1c (Completed)   CBC with Differential/Platelet (Completed)   Comprehensive metabolic panel (Completed)   TSH (Completed)   Insulin, Free and Total (Completed)   Elevated BP without diagnosis of hypertension    [name]'s blood pressure is creeping up with time, which is likely related to her increased BMI and sedentary lifestyle. We have discussed ways to help with weight management. We will likely need to consider blood pressure medication if this continues to increase. I am hopeful we can get her moving a little and reduce the food that she is consuming and get her weight down to help prevent from having to start another medication.  Plan: - We will watch this, if it goes up further, we will plan to start medication - Keep working on weight and exercise as previously discussed.       Relevant Medications   insulin glargine, 2 Unit Dial, (TOUJEO MAX SOLOSTAR) 300 UNIT/ML Solostar Pen   oxybutynin (DITROPAN XL) 5 MG 24 hr tablet   Other Relevant Orders   For Home Use Only DME Diabetic Shoe   For home use only DME Other see comment   For home use only DME Other see comment   Hemoglobin A1c (Completed)   CBC with Differential/Platelet (Completed)   Comprehensive metabolic panel (Completed)   TSH (Completed)   Insulin, Free and Total (Completed)   Urinary incontinence    It is not clear if the urgency and incontinence are related to waiting too long to go to the restroom or difficulty with bladder control. We will check A1c and urine today. Plan to start trial of ditropan to see if this is helpful in improving control. Timed bathroom breaks may also help. Consideration for a portable toilet seat in a more accessible location that does not require stairs would be ideal for her to help reduce the urinary  accidents on the flooring and furniture.  Plan: - Schedule bathroom breaks no more than 2 hours apart during the day to remind [name] to go to the bathroom.  - Start ditropan to see if this helps with improved control.  - No drinks past 5pm to reduce urine at night. - Consider using washable chucks  pads, adult diapers, and absorbant pad at night to reduce the need for bedding changes.        Relevant Medications   oxybutynin (DITROPAN XL) 5 MG 24 hr tablet   Scoliosis   Relevant Medications   insulin glargine, 2 Unit Dial, (TOUJEO MAX SOLOSTAR) 300 UNIT/ML Solostar Pen   oxybutynin (DITROPAN XL) 5 MG 24 hr tablet   Other Relevant Orders   For Home Use Only DME Diabetic Shoe   For home use only DME Other see comment   For home use only DME Other see comment   Hemoglobin A1c (Completed)   CBC with Differential/Platelet (Completed)   Comprehensive metabolic panel (Completed)   TSH (Completed)   Insulin, Free and Total (Completed)    Return in about 3 months (around 10/05/2022).  Time: 50 minutes, >50% spent counseling, care coordination, chart review, and documentation.   [name], DNP, AGNP-c 07/06/2022 10:12 AM

## 2022-07-06 NOTE — Patient Instructions (Addendum)
We will try the oxybutynin at bedtime to see if this helps with the increased urination.   I have sent in the Toujeo to see if this also helps.   We will send the diabetic shoe order to the DME location.

## 2022-07-07 ENCOUNTER — Telehealth: Payer: Self-pay

## 2022-07-07 NOTE — Telephone Encounter (Signed)
I called pts. Dad to let him know about the diabetic shoes and other medical supplies. I let him know that her can call [facility] and schedule an apt. With them for diabetic shoes. I also let him know that [facility] has incontinence supplies and he is aware. He said that the insulin you called in yesterday needs a PA per the pharmacy and ins. Company.

## 2022-07-12 DIAGNOSIS — R32 Unspecified urinary incontinence: Secondary | ICD-10-CM | POA: Insufficient documentation

## 2022-07-12 LAB — COMPREHENSIVE METABOLIC PANEL WITH GFR
ALT: 17 IU/L (ref 0–32)
AST: 15 IU/L (ref 0–40)
Albumin/Globulin Ratio: 1.2 (ref 1.2–2.2)
Albumin: 4 g/dL (ref 4.0–5.0)
Alkaline Phosphatase: 102 IU/L (ref 44–121)
BUN/Creatinine Ratio: 20 (ref 9–23)
BUN: 15 mg/dL (ref 6–20)
Bilirubin Total: <0.2 mg/dL (ref 0.0–1.2)
CO2: 24 mmol/L (ref 20–29)
Calcium: 9.3 mg/dL (ref 8.7–10.2)
Chloride: 101 mmol/L (ref 96–106)
Creatinine, Ser: 0.76 mg/dL (ref 0.57–1.00)
Globulin, Total: 3.3 g/dL (ref 1.5–4.5)
Glucose: 108 mg/dL — ABNORMAL HIGH (ref 70–99)
Potassium: 4.4 mmol/L (ref 3.5–5.2)
Sodium: 138 mmol/L (ref 134–144)
Total Protein: 7.3 g/dL (ref 6.0–8.5)
eGFR: 114 mL/min/1.73

## 2022-07-12 LAB — CBC WITH DIFFERENTIAL/PLATELET
Basophils Absolute: 0 10*3/uL (ref 0.0–0.2)
Basos: 1 %
EOS (ABSOLUTE): 0.1 10*3/uL (ref 0.0–0.4)
Eos: 2 %
Hematocrit: 37.9 % (ref 34.0–46.6)
Hemoglobin: 12.1 g/dL (ref 11.1–15.9)
Immature Grans (Abs): 0 10*3/uL (ref 0.0–0.1)
Immature Granulocytes: 0 %
Lymphocytes Absolute: 2.2 10*3/uL (ref 0.7–3.1)
Lymphs: 30 %
MCH: 25.9 pg — ABNORMAL LOW (ref 26.6–33.0)
MCHC: 31.9 g/dL (ref 31.5–35.7)
MCV: 81 fL (ref 79–97)
Monocytes Absolute: 0.6 10*3/uL (ref 0.1–0.9)
Monocytes: 8 %
Neutrophils Absolute: 4.2 10*3/uL (ref 1.4–7.0)
Neutrophils: 59 %
Platelets: 322 10*3/uL (ref 150–450)
RBC: 4.68 x10E6/uL (ref 3.77–5.28)
RDW: 15.9 % — ABNORMAL HIGH (ref 11.7–15.4)
WBC: 7.1 10*3/uL (ref 3.4–10.8)

## 2022-07-12 LAB — INSULIN, FREE AND TOTAL
Free Insulin: 65 uU/mL — ABNORMAL HIGH
Total Insulin: 65 uU/mL

## 2022-07-12 LAB — HEMOGLOBIN A1C
Est. average glucose Bld gHb Est-mCnc: 131 mg/dL
Hgb A1c MFr Bld: 6.2 % — ABNORMAL HIGH (ref 4.8–5.6)

## 2022-07-12 LAB — TSH: TSH: 2.85 u[IU]/mL (ref 0.450–4.500)

## 2022-07-12 NOTE — Assessment & Plan Note (Signed)
Chronic ongoing concerns related to Jeanella Cara include management of weight and [name]'s diet. Unfortunately, her household has multiple family members and her siblings are not consistent in keeping food out of reach or resisting giving her snacks. This, along with sneaking food is certainly contributing to her overall obesity and difficulty with management of her blood sugars. This inconsistency is concerning as it clearly is affecting her physically. She has had a 26lb weight gain over the past year with a current BMI of 75.7.  Currently, [name]'s skin picking behaviors seem to be improved. She is still healing from the large wound on her thigh.  Plan: -Recommend strict limit setting and reinforcement of meal times and snacks for [name] from all members of the household. Food should be consistently locked to prevent sneaking and compulsive eating behaviors.  - Daily exercise is vital to help control her weight and keep her heart healthy. She is very limited due to her pain and weight, but water exercises would be very beneficial for her.

## 2022-07-12 NOTE — Assessment & Plan Note (Signed)
[name] appears to have slowed on her skin picking since the large infected wound on her leg. I am hopeful that the sertraline is helping to reduce these occurrences and prevent this from recurring.  Plan: - Monitor closely for skin picking behaviors.  - Redirect [name] when she is seen picking

## 2022-07-12 NOTE — Assessment & Plan Note (Signed)
[name]'s blood pressure is creeping up with time, which is likely related to her increased BMI and sedentary lifestyle. We have discussed ways to help with weight management. We will likely need to consider blood pressure medication if this continues to increase. I am hopeful we can get her moving a little and reduce the food that she is consuming and get her weight down to help prevent from having to start another medication.  Plan: - We will watch this, if it goes up further, we will plan to start medication - Keep working on weight and exercise as previously discussed.

## 2022-07-12 NOTE — Assessment & Plan Note (Signed)
[name]'s blood sugars are reportedly improved with use of insulin and Mounjaro. She does have reported nocturia and urinary incontinence, which could be related to blood sugar spikes outside of testing times.- Remind [name] to use the restroom immediately before bed.

## 2022-07-12 NOTE — Assessment & Plan Note (Signed)
It is not clear if the urgency and incontinence are related to waiting too long to go to the restroom or difficulty with bladder control. We will check A1c and urine today. Plan to start trial of ditropan to see if this is helpful in improving control. Timed bathroom breaks may also help. Consideration for a portable toilet seat in a more accessible location that does not require stairs would be ideal for her to help reduce the urinary accidents on the flooring and furniture.  Plan: - Schedule bathroom breaks no more than 2 hours apart during the day to remind [name] to go to the bathroom.  - Start ditropan to see if this helps with improved control.  - No drinks past 5pm to reduce urine at night. - Consider using washable chucks pads, adult diapers, and absorbant pad at night to reduce the need for bedding changes.

## 2022-07-12 NOTE — Assessment & Plan Note (Signed)
[name] has had a 26lb weight gain in the last 12 months, which is concerning for her overall health. Weight management is challenged with compulsive eating associated with Prader-Willi, joint pain and limitations from movement due to size, inconsistencies in keeping access to food limited to meals only, genetic predisposition, insulin use, and patients understanding of importance for weight management.  At this time, my concern is that her mobility is becoming limited to the point that we may not be able to incorporate exercise if she continues to gain. This could be detrimental.  Plan: - Very strict diet recommended with 3 meals and 2 snacks per day. Keep total daily carbohydrates less than 150 grams and protein intake at least 60 grams. Avoid juice, soda, sweet tea, etc as these only provide empty calories and add to carbohydrate count.  - Walk 10 minutes every day. Encourage [name] to dance to YouTube videos to help get her moving.  - Keep food locked and out of her reach to prevent binging and overeating. Speak with other members of the household on the importance of this for [name]'s health and wellbeing.

## 2022-07-19 ENCOUNTER — Other Ambulatory Visit: Payer: Self-pay

## 2022-07-19 ENCOUNTER — Ambulatory Visit (INDEPENDENT_AMBULATORY_CARE_PROVIDER_SITE_OTHER): Payer: 59 | Admitting: Podiatry

## 2022-07-19 ENCOUNTER — Ambulatory Visit (INDEPENDENT_AMBULATORY_CARE_PROVIDER_SITE_OTHER): Payer: 59

## 2022-07-19 DIAGNOSIS — M2141 Flat foot [pes planus] (acquired), right foot: Secondary | ICD-10-CM

## 2022-07-19 DIAGNOSIS — M79672 Pain in left foot: Secondary | ICD-10-CM | POA: Diagnosis not present

## 2022-07-19 DIAGNOSIS — M79671 Pain in right foot: Secondary | ICD-10-CM

## 2022-07-19 DIAGNOSIS — E0840 Diabetes mellitus due to underlying condition with diabetic neuropathy, unspecified: Secondary | ICD-10-CM

## 2022-07-19 DIAGNOSIS — M2142 Flat foot [pes planus] (acquired), left foot: Secondary | ICD-10-CM

## 2022-07-19 MED ORDER — ACCU-CHEK GUIDE VI STRP: ORAL_STRIP | 1 refills | Status: DC

## 2022-07-19 NOTE — Progress Notes (Signed)
   Chief Complaint  Patient presents with   Diabetes    Patient came in today for Diabetic foot care, nail trim, and diabetic shoes     Subjective:  22 y.o. female presenting today as a new patient for evaluation of bilateral foot pain.  Patient has history of diabetes mellitus and Prader-Willi syndrome.  Patient's father states that she complains of bilateral foot pain when ambulating.  He is presenting today to discuss possible diabetic shoes with insoles   Past Medical History:  Diagnosis Date   Body mass index (BMI) of 70 or greater in adult 07/20/2020   DM (diabetes mellitus), type 2, uncontrolled with complications    Open wound of skin 07/20/2020   Pain in joint involving pelvic region and thigh, bilateral 07/20/2020   Prader-Willi syndrome    Scoliosis    Tibia vara of left lower extremity    Past Surgical History:  Procedure Laterality Date   KNEE SURGERY     MYRINGOTOMY Bilateral    Allergies  Allergen Reactions   Latex Rash    Objective/Physical Exam General: The patient is alert and oriented x3 in no acute distress.  Dermatology: Skin is warm, dry and supple bilateral lower extremities. Negative for open lesions or macerations.  Vascular: Palpable pedal pulses bilaterally. No edema or erythema noted. Capillary refill within normal limits.  Neurological: Diminished to light touch  Musculoskeletal Exam: Range of motion within normal limits to all pedal and ankle joints bilateral. Muscle strength 5/5 in all groups bilateral. Upon weightbearing there is a medial longitudinal arch collapse bilaterally. Remove foot valgus noted to the bilateral lower extremities with excessive pronation upon mid stance.  Obesity bilateral lower extremity edema  Radiographic Exam B/L feet 07/19/2022:  Heavy edema with obesity noted to the legs which is limiting the visualization of the foot x-rays on certain views.  Normal osseous mineralization. Joint spaces preserved. No  fracture/dislocation/boney destruction.  Pes planus noted on radiographic exam lateral views. Decreased calcaneal inclination and metatarsal declination angle is noted. Anterior break in the cyma line noted on lateral views. Medial talar head to deviation noted on AP radiograph.    Assessment: 1. pes planus bilateral 2.  Diabetes mellitus with peripheral polyneuropathy  -Patient evaluated.  X-rays reviewed -I do believe the patient would benefit from custom molded Plastizote insoles with diabetic shoes -Prescription provided and order placed for diabetic shoes and insoles.  This will be sent to [facility] in [place], [place] -Turn to clinic as needed  Felecia Shelling, DPM [facility]  Dr. Felecia Shelling, DPM    [address]                                        [place], [place] [address]                Office [phone]  Fax [phone]

## 2022-07-21 ENCOUNTER — Encounter (HOSPITAL_BASED_OUTPATIENT_CLINIC_OR_DEPARTMENT_OTHER): Payer: 59 | Attending: Internal Medicine | Admitting: General Surgery

## 2022-07-21 DIAGNOSIS — Q8711 Prader-Willi syndrome: Secondary | ICD-10-CM | POA: Diagnosis not present

## 2022-07-21 DIAGNOSIS — E1165 Type 2 diabetes mellitus with hyperglycemia: Secondary | ICD-10-CM | POA: Insufficient documentation

## 2022-07-21 DIAGNOSIS — Z6841 Body Mass Index (BMI) 40.0 and over, adult: Secondary | ICD-10-CM | POA: Diagnosis not present

## 2022-07-21 DIAGNOSIS — E11622 Type 2 diabetes mellitus with other skin ulcer: Secondary | ICD-10-CM | POA: Diagnosis not present

## 2022-07-21 DIAGNOSIS — F424 Excoriation (skin-picking) disorder: Secondary | ICD-10-CM | POA: Insufficient documentation

## 2022-07-21 DIAGNOSIS — L97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: Secondary | ICD-10-CM | POA: Insufficient documentation

## 2022-07-21 DIAGNOSIS — S71101A Unspecified open wound, right thigh, initial encounter: Secondary | ICD-10-CM | POA: Diagnosis not present

## 2022-07-22 NOTE — Progress Notes (Signed)
Rebekah, [name] ([id number]) [id number] Page 1 of 8 Visit Report for 07/21/2022 Arrival Information Details Patient Name: Date of Service: DO [place], [place] 07/21/2022 9:15 A M Medical Record Number: [id number] Patient Account Number: [id number] Date of Birth/Sex: Treating RN: [birth year] (21 y.o. [name], [name] Primary Care Avid Guillette: [name] Other Clinician: Referring [name]: Treating [name]/Extender: [name] in Treatment: 8 Visit Information History Since Last Visit Added or deleted any medications: No Patient Arrived: [name] Any new allergies or adverse reactions: No Arrival Time: 09:35 Had a fall or experienced change in No Accompanied By: husband activities of daily living that may affect Transfer Assistance: Manual risk of falls: Patient Identification Verified: Yes Signs or symptoms of abuse/neglect since last visito No Secondary Verification Process Completed: Yes Hospitalized since last visit: No Patient Requires Transmission-Based Precautions: No Implantable device outside of the clinic excluding No cellular tissue based products placed in the center since last visit: Has Dressing in Place as Prescribed: Yes Pain Present Now: No Electronic Signature(s) Signed: 07/21/2022 3:40:39 PM By: [name] Entered By: [name] on 07/21/2022 09:35:41 -------------------------------------------------------------------------------- Clinic Level of Care Assessment Details Patient Name: Date of Service: DO [name], [place]. 07/21/2022 9:15 A M Medical Record Number: [id number] Patient Account Number: [id number] Date of Birth/Sex: Treating RN: March 17, 2001 (21 y.o. [name] Primary Care [name]: [name] Other Clinician: Referring [name]: Treating Bobbyjoe Pabst/Extender: [name] in Treatment: 8 Clinic Level of Care Assessment Items TOOL 4  Quantity Score X- 1 0 Use when only an EandM is performed on FOLLOW-UP visit ASSESSMENTS - Nursing Assessment / Reassessment X- 1 10 Reassessment of Co-morbidities (includes updates in patient status) X- 1 5 Reassessment of Adherence to Treatment Plan ASSESSMENTS - Wound and Skin A ssessment / Reassessment X - Simple Wound Assessment / Reassessment - one wound 1 5 []  - 0 Complex Wound Assessment / Reassessment - multiple wounds []  - 0 Dermatologic / Skin Assessment (not related to wound area) ASSESSMENTS - Focused Assessment []  - 0 Circumferential Edema Measurements - multi extremities []  - 0 Nutritional Assessment / Counseling / Intervention []  - 0 Lower Extremity Assessment (monofilament, tuning fork, pulses) []  - 0 Peripheral Arterial Disease Assessment (using hand held doppler) ASSESSMENTS - Ostomy and/or Continence Assessment and Care []  - 0 Incontinence Assessment and Management []  - 0 Ostomy Care Assessment and Management (repouching, etc.) PROCESS - Coordination of Care X - Simple Patient / Family Education for ongoing care 1 15 [place], [name] [name]) [phone] Page 2 of 8 []  - 0 Complex (extensive) Patient / Family Education for ongoing care X- 1 10 Staff obtains Chiropractor, Records, T Results / Process Orders est []  - 0 Staff telephones HHA, Nursing Homes / Clarify orders / etc []  - 0 Routine Transfer to another Facility (non-emergent condition) []  - 0 Routine Hospital Admission (non-emergent condition) []  - 0 New Admissions / Manufacturing engineer / Ordering NPWT Apligraf, etc. , []  - 0 Emergency Hospital Admission (emergent condition) X- 1 10 Simple Discharge Coordination []  - 0 Complex (extensive) Discharge Coordination PROCESS - Special Needs []  - 0 Pediatric / Minor Patient Management []  - 0 Isolation Patient Management []  - 0 Hearing / Language / Visual special needs []  - 0 Assessment of Community  assistance (transportation, D/C planning, etc.) []  - 0 Additional assistance / Altered mentation []  - 0 Support Surface(s) Assessment (bed, cushion, seat, etc.) INTERVENTIONS - Wound Cleansing / Measurement X - Simple  Wound Cleansing - one wound 1 5 []  - 0 Complex Wound Cleansing - multiple wounds X- 1 5 Wound Imaging (photographs - any number of wounds) []  - 0 Wound Tracing (instead of photographs) X- 1 5 Simple Wound Measurement - one wound []  - 0 Complex Wound Measurement - multiple wounds INTERVENTIONS - Wound Dressings X - Small Wound Dressing one or multiple wounds 1 10 []  - 0 Medium Wound Dressing one or multiple wounds []  - 0 Large Wound Dressing one or multiple wounds []  - 0 Application of Medications - topical []  - 0 Application of Medications - injection INTERVENTIONS - Miscellaneous []  - 0 External ear exam []  - 0 Specimen Collection (cultures, biopsies, blood, body fluids, etc.) []  - 0 Specimen(s) / Culture(s) sent or taken to Lab for analysis []  - 0 Patient Transfer (multiple staff / Nurse, adult / Similar devices) []  - 0 Simple Staple / Suture removal (25 or less) []  - 0 Complex Staple / Suture removal (26 or more) []  - 0 Hypo / Hyperglycemic Management (close monitor of Blood Glucose) []  - 0 Ankle / Brachial Index (ABI) - do not check if billed separately X- 1 5 Vital Signs Has the patient been seen at the hospital within the last three years: Yes Total Score: 85 Level Of Care: New/Established - Level 3 Electronic Signature(s) Signed: 07/21/2022 3:40:39 PM By: [name] Entered By: [name] on 07/21/2022 10:42:44 [name], [name] ([id number]) [id number] Page 3 of 8 -------------------------------------------------------------------------------- Encounter Discharge Information Details Patient Name: Date of Service: DO [name], [place] 07/21/2022 9:15 A M Medical Record Number: [id number] Patient  Account Number: [id number] Date of Birth/Sex: Treating RN: [birth year] (21 y.o. [name] Primary Care Samyria Rudie: [name] Other Clinician: Referring Avel Ogawa: Treating Brock Larmon/Extender: [name] in Treatment: 8 Encounter Discharge Information Items Discharge Condition: Stable Ambulatory Status: Walker Discharge Destination: Home Transportation: Private Auto Accompanied By: father Schedule Follow-up Appointment: Yes Clinical Summary of Care: Patient Declined Electronic Signature(s) Signed: 07/21/2022 3:40:39 PM By: [name] Entered By: [name] on 07/21/2022 10:43:17 -------------------------------------------------------------------------------- Lower Extremity Assessment Details Patient Name: Date of Service: DO [name], [place] [name]. 07/21/2022 9:15 A M Medical Record Number: [id number] Patient Account Number: [id number] Date of Birth/Sex: Treating RN: February 03, 2001 (21 y.o. [name] Primary Care [name]: [name] Other Clinician: Referring Onika Gudiel: Treating Nonie Lochner/Extender: [name] in Treatment: 8 Electronic Signature(s) Signed: 07/21/2022 3:40:39 PM By: Gelene Mink By: [name] on 07/21/2022 09:36:07 -------------------------------------------------------------------------------- Multi Wound Chart Details Patient Name: Date of Service: DO [name], [name]. 07/21/2022 9:15 A M Medical Record Number: [id number] Patient Account Number: [id number] Date of Birth/Sex: Treating RN: February 11, 2001 (21 y.o. F) Primary Care Sasan Wilkie: [name] Other Clinician: Referring Malick Netz: Treating Asante Blanda/Extender: [name] in Treatment: 8 Vital Signs Height(in): 60 Pulse(bpm): 105 Weight(lbs): 380 Blood Pressure(mmHg): 162/111 Body Mass Index(BMI): 74.2 Temperature(F): 97.6 Respiratory Rate(breaths/min): 18 [1:Photos:]  [N/A:N/A] MERCEDES, FORT ([id number]) [1:Right, Lateral Upper Leg Wound Location: Trauma Wounding Event: Trauma, Other Primary Etiology: Type II Diabetes Comorbid History: 04/07/2022 Date Acquired: 8 Weeks of Treatment: Open Wound Status: No Wound Recurrence: 0.4x0.4x3.3 Measurements L x W x  D (cm) 0.126 A (cm) : rea 0.415 Volume (cm) : 98.90% % Reduction in A rea: 99.20% % Reduction in Volume: Full Thickness Without Exposed Classification: Support Structures Medium Exudate Amount: Serosanguineous Exudate Type: red, brown Exudate Color:  Distinct, outline attached Wound Margin:  Large (67-100%) Granulation Amount: Red, Pink Granulation Quality: None Present (0%) Necrotic Amount: Fat Layer (Subcutaneous Tissue): Yes N/A Exposed Structures: Fascia: No Tendon: No Muscle: No Joint: No Bone:  No Small (1-33%) Epithelialization: Scarring: Yes Periwound Skin Texture: Dry/Scaly: Yes Periwound Skin Moisture: No Abnormalities Noted Periwound Skin Color: No Abnormality Temperature:] [N/A:N/A N/A N/A N/A N/A N/A N/A N/A N/A N/A N/A N/A N/A N/A N/A  N/A N/A N/A N/A N/A N/A N/A N/A N/A N/A N/A] Treatment Notes Electronic Signature(s) Signed: 07/21/2022 10:03:27 AM By: Duanne Guess MD FACS Entered By: Duanne Guess on 07/21/2022 10:03:27 -------------------------------------------------------------------------------- Multi-Disciplinary Care Plan Details Patient Name: Date of Service: DO [name], [name]. 07/21/2022 9:15 A M Medical Record Number: [id number] Patient Account Number: [id number] Date of Birth/Sex: Treating RN: [birth year] (21 y.o. [name] Primary Care Cartrell Bentsen: [name] Other Clinician: Referring Sahiba Granholm: Treating Cleon Thoma/Extender: [name] in Treatment: 8 Active Inactive Necrotic Tissue Nursing Diagnoses: Impaired tissue integrity related to necrotic/devitalized tissue Knowledge deficit related to management of necrotic/devitalized  tissue Goals: Necrotic/devitalized tissue will be minimized in the wound bed Date Initiated: 05/23/2022 Target Resolution Date: 11/30/2022 Goal Status: Active Patient/caregiver will verbalize understanding of reason and process for debridement of necrotic tissue Date Initiated: 05/23/2022 Target Resolution Date: 11/30/2022 Goal Status: Active Interventions: Assess patient pain level pre-, during and post procedure and prior to discharge Provide education on necrotic tissue and debridement process Treatment Activities: Apply topical anesthetic as ordered : 05/23/2022 Notes: Rebekah, [name] ([id number]) [id number].pdf Page 5 of 8 Wound/Skin Impairment Nursing Diagnoses: Impaired tissue integrity Knowledge deficit related to ulceration/compromised skin integrity Goals: Patient/caregiver will verbalize understanding of skin care regimen Date Initiated: 05/23/2022 Target Resolution Date: 11/30/2022 Goal Status: Active Interventions: Assess patient/caregiver ability to perform ulcer/skin care regimen upon admission and as needed Assess ulceration(s) every visit Treatment Activities: Skin care regimen initiated : 05/23/2022 Topical wound management initiated : 05/23/2022 Notes: Electronic Signature(s) Signed: 07/21/2022 3:40:39 PM By: [name] Entered By: [name] on 07/21/2022 09:41:33 -------------------------------------------------------------------------------- Pain Assessment Details Patient Name: Date of Service: DO [name], [name]. 07/21/2022 9:15 A M Medical Record Number: [id number] Patient Account Number: [id number] Date of Birth/Sex: Treating RN: 07/09/2000 (21 y.o. [name] Primary Care [name]: [name] Other Clinician: Referring Aimar Borghi: Treating Carolee Channell/Extender: [name] in Treatment: 8 Active Problems Location of Pain Severity and Description of Pain Patient Has Paino No Site  Locations Rate the pain. Current Pain Level: 0 Pain Management and Medication Current Pain Management: Electronic Signature(s) Signed: 07/21/2022 3:40:39 PM By: [name] Entered By: [name] on 07/21/2022 09:36:04 -------------------------------------------------------------------------------- Patient/Caregiver Education Details Patient Name: Date of Service: DO [name], [name]. 4/26/2024andnbsp9:15 A M [name], [name] ([id number]) [id number] Page 6 of 8 Medical Record Number: [id number] Patient Account Number: [id number] Date of Birth/Gender: Treating RN: 08/15/2000 (21 y.o. [name] Primary Care Physician: [name] Other Clinician: Referring Physician: Treating Physician/Extender: [name] in Treatment: 8 Education Assessment Education Provided To: Patient Education Topics Provided Wound/Skin Impairment: Methods: Explain/Verbal Responses: Reinforcements needed, State content correctly Electronic Signature(s) Signed: 07/21/2022 3:40:39 PM By: [name] Entered By: [name] on 07/21/2022 09:41:44 -------------------------------------------------------------------------------- Wound Assessment Details Patient Name: Date of Service: DO [name], [name]. 07/21/2022 9:15 A M Medical Record Number: [id number] Patient Account Number: [id number] Date of Birth/Sex: Treating RN: 08/19/2000 (21 y.o. [name] Primary Care [name]: [name] Other Clinician:  Referring Ion Gonnella: Treating [name]/Extender: [name] in Treatment: 8 Wound Status Wound Number: 1 Primary Etiology: Trauma, Other Wound Location: Right, Lateral Upper Leg Wound Status: Open Wounding Event: Trauma Comorbid History: Type II Diabetes Date Acquired: 04/07/2022 Weeks Of Treatment: 8 Clustered Wound: No Photos Wound Measurements Length: (cm) 0.4 Width:  (cm) 0.4 Depth: (cm) 3.3 Area: (cm) 0.126 Volume: (cm) 0.415 % Reduction in Area: 98.9% % Reduction in Volume: 99.2% Epithelialization: Small (1-33%) Tunneling: No Undermining: No Wound Description Classification: Full Thickness Without Exposed Suppor Wound Margin: Distinct, outline attached Exudate Amount: Medium Exudate Type: Serosanguineous Exudate Color: red, brown t Structures Foul Odor After Cleansing: No Slough/Fibrino No Wound Bed Granulation Amount: Large (67-100%) Exposed [name], [name] ([id number]) [phone].pdf Page 7 of 8 Granulation Quality: Red, Pink Fascia Exposed: No Necrotic Amount: None Present (0%) Fat Layer (Subcutaneous Tissue) Exposed: Yes Tendon Exposed: No Muscle Exposed: No Joint Exposed: No Bone Exposed: No Periwound Skin Texture Texture Color No Abnormalities Noted: No No Abnormalities Noted: Yes Scarring: Yes Temperature / Pain Temperature: No Abnormality Moisture No Abnormalities Noted: No Dry / Scaly: Yes Treatment Notes Wound #1 (Upper Leg) Wound Laterality: Right, Lateral Cleanser Soap and Water Discharge Instruction: May shower and wash wound with dial antibacterial soap and water prior to dressing change. Wound Cleanser Discharge Instruction: Cleanse the wound with wound cleanser prior to applying a clean dressing using gauze sponges, not tissue or cotton balls. Peri-Wound Care Skin Prep Discharge Instruction: Use skin prep as directed Sween Lotion (Moisturizing lotion) Discharge Instruction: Apply moisturizing lotion as directed Topical Primary Dressing Iodoform packing strip 1/2 (in) Discharge Instruction: Lightly pack as instructed Sorbalgon [employer] Dressing 2x2 (in/in) Discharge Instruction: Apply to skin breakdown around the wound. Secondary Dressing ABD Pad, 5x9 Discharge Instruction: Apply over primary dressing as directed. Woven Gauze Sponge, Non-Sterile 4x4 in Discharge  Instruction: Apply over primary dressing as directed. Secured With 73M Medipore H Soft Cloth Surgical T ape, 4 x 10 (in/yd) Discharge Instruction: Secure with tape as directed. Compression Wrap Compression Stockings Add-Ons Electronic Signature(s) Signed: 07/21/2022 3:40:39 PM By: [name] Entered By: [name] on 07/21/2022 09:40:36 -------------------------------------------------------------------------------- Vitals Details Patient Name: Date of Service: DO [name], [name]. 07/21/2022 9:15 A M Medical Record Number: [id number] Patient Account Number: [id number] Date of Birth/Sex: Treating RN: 2000/10/20 (21 y.o. [name] Primary Care Boss Danielsen: [name] Other Clinician: Referring Rebekah Farve: Treating Parminder Trapani/Extender: [name] in Treatment: 8 Vital Signs [name], [name] ([id number]) [id number] Page 8 of 8 Time Taken: 09:35 Temperature (F): 97.6 Height (in): 60 Pulse (bpm): 105 Weight (lbs): 380 Respiratory Rate (breaths/min): 18 Body Mass Index (BMI): 74.2 Blood Pressure (mmHg): 162/111 Reference Range: 80 - 120 mg / dl Electronic Signature(s) Signed: 07/21/2022 3:40:39 PM By: [name] Entered By: [name] on 07/21/2022 09:35:58

## 2022-07-22 NOTE — Progress Notes (Signed)
[name], [name] ([id number]) [id number] Page 1 of 6 Visit Report for 07/21/2022 Chief Complaint Document Details Patient Name: Date of Service: DO [place], [place] 07/21/2022 9:15 A M Medical Record Number: [id number] Patient Account Number: [id number] Date of Birth/Sex: Treating RN: 05-23-2000 (21 y.o. F) Primary Care Provider: Enid [name] Other Clinician: Referring Provider: Treating Provider/Extender: [name] in Treatment: 8 Information Obtained from: Patient Chief Complaint Patient seen for complaints of Non-Healing Wound. Electronic Signature(s) Signed: 07/21/2022 10:03:35 AM By: Rebekah Guess MD FACS Entered By: [name] on 07/21/2022 10:03:35 -------------------------------------------------------------------------------- HPI Details Patient Name: Date of Service: [name], MA [name]. 07/21/2022 9:15 A M Medical Record Number: [id number] Patient Account Number: [id number] Date of Birth/Sex: Treating RN: 07-08-2000 (21 y.o. F) Primary Care Provider: Enid [name] Other Clinician: Referring Provider: Treating Provider/Extender: [name] in Treatment: 8 History of Present Illness HPI Description: ADMISSION 05/23/2022 This is a 22 year old young woman with Prader-Willi syndrome and the usual accompanying super morbid obesity, diabetes, and skin picking disorder. She picked a large wound in her right thigh that resulted in an emergency department visit on 17 February. Apparently her father was cleaning the wound with mupirocin and hydrogen peroxide. A culture was taken that grew out Proteus and methicillin sensitive Staph aureus. She was prescribed levofloxacin. She saw her primary care provider a few days later and based on the depth and appearance of the wound, she was referred to the wound care center for further evaluation and management. There is a large area on the patient's  upper right thigh that is excoriated and scaly with dry skin that has obviously been picked at. The primary wound is 4-1/2 cm in depth. It is much cleaner than described in the ED notes. There is also a linear ulcer adjacent to this deep site with slough accumulation. 3/15; patient presents for follow-up. She has been using iodoform packing followed by silver alginate to the wound bed. Mother is present during the encounter. There is been improvement in wound healing. 06/23/2022: The depth of the wound has come in further. The periwound skin is in improved condition with very few open areas. 07/21/2022: The wound continues to contract. Her periwound skin has completely healed. Electronic Signature(s) Signed: 07/21/2022 10:04:09 AM By: Rebekah Guess MD FACS Entered By: [name] on 07/21/2022 10:04:09 -------------------------------------------------------------------------------- Physical Exam Details Patient Name: Date of Service: [name], MA [name]. 07/21/2022 9:15 A M Medical Record Number: [id number] Patient Account Number: [id number] Date of Birth/Sex: Treating RN: [birth year] (21 y.o. F) Primary Care Provider: Enid [name] Other Clinician: Referring Provider: Treating Provider/Extender: [name] in Treatment: 8 Constitutional Hypertensive, asymptomatic. Slightly tachycardic. . . no acute distress. [name], [name] ([id number]) [id number] Page 2 of 6 Respiratory Normal work of breathing on room air. Notes 07/21/2022: The wound continues to contract. Her periwound skin has completely healed. Electronic Signature(s) Signed: 07/21/2022 10:04:54 AM By: Rebekah Guess MD FACS Entered By: [name] on 07/21/2022 10:04:54 -------------------------------------------------------------------------------- Physician Orders Details Patient Name: Date of Service: [name], MA [name]. 07/21/2022 9:15 A M Medical Record  Number: [id number] Patient Account Number: [id number] Date of Birth/Sex: Treating RN: 2000/04/14 (21 y.o. 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 - Dr. Lady [name] - room 2 Bathing/ Shower/ Hygiene May shower and wash wound with soap and water. Wound Treatment Wound #1 - Upper Leg Wound Laterality: Right, Lateral Cleanser: Soap and Water 1 x Per Day/30 Days Discharge Instructions: May shower and wash wound with dial antibacterial soap and water prior to dressing change. Cleanser: Wound Cleanser (Generic) 1 x Per Day/30 Days Discharge Instructions: Cleanse the wound with wound cleanser prior to applying a clean dressing using gauze sponges, not tissue or cotton balls. Peri-Wound Care: Skin Prep (Generic) 1 x Per Day/30 Days Discharge Instructions: Use skin prep as directed Peri-Wound Care: Sween Lotion (Moisturizing lotion) 1 x Per Day/30 Days Discharge Instructions: Apply moisturizing lotion as directed Prim Dressing: Iodoform packing strip 1/2 (in) (Generic) 1 x Per Day/30 Days ary Discharge Instructions: Lightly pack as instructed Prim Dressing: Sorbalgon [employer] Dressing 2x2 (in/in) (Generic) 1 x Per Day/30 Days ary Discharge Instructions: Apply to skin breakdown around the wound. Secondary Dressing: ABD Pad, 5x9 (Generic) 1 x Per Day/30 Days Discharge Instructions: Apply over primary dressing as directed. Secondary Dressing: Woven Gauze Sponge, Non-Sterile 4x4 in (Generic) 1 x Per Day/30  Days Discharge Instructions: Apply over primary dressing as directed. Secured With: 55M Medipore H Soft Cloth Surgical T ape, 4 x 10 (in/yd) (Generic) 1 x Per Day/30 Days Discharge Instructions: Secure with tape as directed. Electronic Signature(s) Signed: 07/21/2022 10:05:23 AM By: Rebekah Guess MD FACS [name], [name] ([id number]) [id number] Page 3 of 6 Entered By: [name] on 07/21/2022 10:05:22 -------------------------------------------------------------------------------- Problem List Details Patient Name: Date of Service: DO [name], [place] 07/21/2022 9:15 A M Medical Record Number: [id number] Patient Account Number: [id number] Date of Birth/Sex: Treating RN: June 27, 2000 (21 y.o. 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 07/21/2022 9:15 A M Medical Record Number: [id number] Patient Account Number: [id number] Date of Birth/Sex: Treating RN: September 24, 2000 (21 y.o. F) Primary Care Provider: Enid [name] Other Clinician: Referring Provider: Treating Provider/Extender: [name]  in Treatment: 8 Subjective Chief Complaint Information obtained from Patient Patient seen for complaints of Non-Healing Wound. History of Present Illness (HPI) ADMISSION 05/23/2022 This is a 22 year old young woman with Prader-Willi syndrome and the usual accompanying super morbid obesity, diabetes, and skin picking disorder. She picked a large wound in her right thigh that resulted in an emergency department visit on 17 February. Apparently her father was cleaning the wound with mupirocin and hydrogen peroxide. A culture was taken that grew out Proteus and methicillin sensitive Staph aureus. She was prescribed levofloxacin. She saw her primary care provider a few days later and based on the depth and appearance of the wound, she was referred to  the wound care center for further evaluation and management. [name], [name] ([id number]) [id number] Page 4 of 6 There is a large area on the patient's upper right thigh that is excoriated and scaly with dry skin that has obviously been picked at. The primary wound is 4-1/2 cm in depth. It is much cleaner than described in the ED notes. There is also a linear ulcer adjacent to this deep site with slough accumulation. 3/15; patient presents for follow-up. She has been using iodoform packing followed by silver alginate to the wound bed. Mother is present during the encounter. There is been improvement in wound healing. 06/23/2022: The depth of the wound has come in further. The periwound skin is in improved condition with very few open areas. 07/21/2022: The wound continues to contract. Her periwound skin has completely healed. Patient History Information obtained from Caregiver. Family History Hypertension - Father, Thyroid Problems - Father, No family history of Cancer, Diabetes, Heart Disease, Hereditary Spherocytosis, Kidney Disease, Lung Disease, Seizures, Stroke, Tuberculosis. Social History Never smoker, Marital  Status - Single, Alcohol Use - Never, Drug Use - No History, Caffeine Use - Never. Medical History Endocrine Patient has history of Type II Diabetes Hospitalization/Surgery History - knee surgery. - myringotomy. Medical A Surgical History Notes nd Constitutional Symptoms (General Health) obese Musculoskeletal scoliosis Neurologic Prader-willi syndrome Psychiatric compulsive skin picking Objective Constitutional Hypertensive, asymptomatic. Slightly tachycardic. no acute distress. Vitals Time Taken: 9:35 AM, Height: 60 in, Weight: 380 lbs, BMI: 74.2, Temperature: 97.6 F, Pulse: 105 bpm, Respiratory Rate: 18 breaths/min, Blood Pressure: 162/111 mmHg. Respiratory Normal work of breathing on room air. General Notes: 07/21/2022: The wound continues to contract. Her periwound skin has completely healed. Integumentary (Hair, Skin) Wound #1 status is Open. Original cause of wound was Trauma. The date acquired was: 04/07/2022. The wound has been in treatment 8 weeks. The wound is located on the Right,Lateral Upper Leg. The wound measures 0.4cm length x 0.4cm width x 3.3cm depth; 0.126cm^2 area and 0.415cm^3 volume. There is Fat Layer (Subcutaneous Tissue) exposed. There is no tunneling or undermining noted. There is a medium amount of serosanguineous drainage noted. The wound margin is distinct with the outline attached to the wound base. There is large (67-100%) red, pink granulation within the wound bed. There is no necrotic tissue within the wound bed. The periwound skin appearance had no abnormalities noted for color. The periwound skin appearance exhibited: Scarring, Dry/Scaly. Periwound temperature was noted as No Abnormality. 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 - Dr. Lady [name] - room 2 Bathing/ Shower/ Hygiene: May shower and wash wound with soap and water. WOUND #1: - Upper Leg Wound Laterality: Right, Lateral Cleanser: Soap and Water 1 x Per Day/30 Days Discharge Instructions: May shower and wash wound with dial antibacterial soap and water prior to dressing change. Cleanser: Wound Cleanser (Generic) 1 x Per Day/30 Days Discharge Instructions: Cleanse the wound with wound cleanser prior to applying a clean dressing using gauze sponges, not tissue or cotton balls. Peri-Wound Care: Skin Prep (Generic) 1 x Per Day/30 Days Discharge Instructions: Use skin prep as directed Peri-Wound Care: Sween Lotion (Moisturizing lotion) 1 x Per Day/30 Days Discharge Instructions: Apply moisturizing lotion as directed Prim Dressing: Iodoform packing strip 1/2 (in) (Generic)  1 x Per Day/30 Days ary Discharge Instructions: Lightly pack as instructed Prim Dressing: Sorbalgon AG Dressing 2x2 (in/in) (Generic) 1 x Per Day/30 Days ary Discharge Instructions: Apply to skin breakdown around the wound. Secondary Dressing: ABD Pad, 5x9 (Generic) 1 x Per Day/30 Days Discharge Instructions: Apply over primary dressing as directed. Secondary Dressing: Woven Gauze Sponge, Non-Sterile 4x4 in (Generic) 1 x Per Day/30 Days Discharge Instructions: Apply over primary dressing as directed. Secured With: 78M Medipore H Soft Cloth Surgical T ape, 4 x 10 (in/yd) (Generic) 1 x Per Day/30 Days Discharge Instructions: Secure with tape as directed. 07/21/2022: The wound continues to contract. Her periwound skin has completely healed. No debridement was necessary today. We can discontinue the topical mupirocin as she has no open superficial wound. We will continue to pack the tunnel with iodoform packing strips. She will follow-up in 1 month. Electronic  Signature(s) Signed: 07/21/2022 10:07:15 AM By: Rebekah Guess MD FACS Previous Signature: 07/21/2022 10:06:39 AM Version By: Rebekah Guess MD FACS Entered By: [name] on 07/21/2022 10:07:15 -------------------------------------------------------------------------------- HxROS Details Patient Name: Date of Service: [name], MA [name]. 07/21/2022 9:15 A M Medical Record Number: [id number] Patient Account Number: [id number] Date of Birth/Sex: Treating RN: Sep 27, 2000 (21 y.o. F) Primary Care Provider: Enid [name] Other Clinician: Referring Provider: Treating Provider/Extender: [name] in Treatment: 8 Information Obtained From Caregiver Constitutional Symptoms (General Health) Medical History: Past Medical History Notes: obese Endocrine Medical History: Positive for: Type II Diabetes Time with diabetes: 3 yrs Treated with: Insulin Blood sugar tested every day: Yes Tested : Musculoskeletal Medical History: Past Medical History Notes: scoliosis Neurologic Medical History: Past Medical History NotesYASAMIN, [name] ([id number]) [id number] Page 6 of 6 Psychiatric Medical History: Past Medical History Notes: compulsive skin picking Immunizations Pneumococcal Vaccine: Received Pneumococcal Vaccination: No Implantable Devices None Hospitalization / Surgery History Type of Hospitalization/Surgery knee surgery myringotomy Family and Social History Cancer: No; Diabetes: No; Heart Disease: No; Hereditary Spherocytosis: No; Hypertension: Yes - Father; Kidney Disease: No; Lung Disease: No; Seizures: No; Stroke: No; Thyroid Problems: Yes - Father; Tuberculosis: No; Never smoker; Marital Status - Single; Alcohol Use: Never; Drug Use: No History; Caffeine Use: Never; Financial Concerns: No; Food, Clothing or Shelter Needs: No; Support System Lacking: No; Transportation Concerns:  No Electronic Signature(s) Signed: 07/21/2022 12:17:23 PM By: Rebekah Guess MD FACS Entered By: [name] on 07/21/2022 10:04:21 -------------------------------------------------------------------------------- SuperBill Details Patient Name: Date of Service: [name], MA [name]. 07/21/2022 Medical Record Number: [id number] Patient Account Number: [id number] Date of Birth/Sex: Treating RN: 2000/06/06 (21 y.o. 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 Q87.11 Prader-Willi syndrome Z68.45 Body mass index [BMI] 70 or greater, adult E11.65 Type 2 diabetes mellitus with hyperglycemia Facility Procedures : CPT4 Code: 46962952 Description: 99213 - WOUND CARE VISIT-LEV 3 EST PT Modifier: Quantity: 1 Physician Procedures : CPT4 Code Description Modifier [id number] - WC PHYS LEVEL 4 - EST PT ICD-10 Diagnosis Description 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

## 2022-07-25 ENCOUNTER — Other Ambulatory Visit: Payer: Self-pay | Admitting: Podiatry

## 2022-07-25 DIAGNOSIS — M79671 Pain in right foot: Secondary | ICD-10-CM

## 2022-07-25 DIAGNOSIS — M79672 Pain in left foot: Secondary | ICD-10-CM

## 2022-07-25 DIAGNOSIS — E0840 Diabetes mellitus due to underlying condition with diabetic neuropathy, unspecified: Secondary | ICD-10-CM

## 2022-07-25 DIAGNOSIS — M2141 Flat foot [pes planus] (acquired), right foot: Secondary | ICD-10-CM

## 2022-07-26 ENCOUNTER — Telehealth: Payer: Self-pay | Admitting: Nurse Practitioner

## 2022-07-26 NOTE — Telephone Encounter (Signed)
P.A. Nathen May completed & results states not needed, on plans list of covered drugs.  Dad already picked up for $0 co pay & also picked up [facility] 2/24 3 boxes

## 2022-07-27 NOTE — Telephone Encounter (Signed)
picked up for $0 co pay & also picked up [facility] 2/24 3 boxes

## 2022-08-12 ENCOUNTER — Encounter: Payer: Self-pay | Admitting: Nurse Practitioner

## 2022-08-18 ENCOUNTER — Encounter (HOSPITAL_BASED_OUTPATIENT_CLINIC_OR_DEPARTMENT_OTHER): Payer: 59 | Attending: General Surgery | Admitting: General Surgery

## 2022-08-18 DIAGNOSIS — E1165 Type 2 diabetes mellitus with hyperglycemia: Secondary | ICD-10-CM | POA: Diagnosis not present

## 2022-08-18 DIAGNOSIS — S71101A Unspecified open wound, right thigh, initial encounter: Secondary | ICD-10-CM | POA: Diagnosis not present

## 2022-08-18 DIAGNOSIS — L97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: Secondary | ICD-10-CM | POA: Insufficient documentation

## 2022-08-18 DIAGNOSIS — Q8711 Prader-Willi syndrome: Secondary | ICD-10-CM | POA: Diagnosis not present

## 2022-08-18 DIAGNOSIS — F424 Excoriation (skin-picking) disorder: Secondary | ICD-10-CM | POA: Insufficient documentation

## 2022-08-23 DIAGNOSIS — E1165 Type 2 diabetes mellitus with hyperglycemia: Secondary | ICD-10-CM | POA: Diagnosis not present

## 2022-08-23 DIAGNOSIS — Q6652 Congenital pes planus, left foot: Secondary | ICD-10-CM | POA: Diagnosis not present

## 2022-08-23 DIAGNOSIS — Q6651 Congenital pes planus, right foot: Secondary | ICD-10-CM | POA: Diagnosis not present

## 2022-09-06 DIAGNOSIS — S71101A Unspecified open wound, right thigh, initial encounter: Secondary | ICD-10-CM | POA: Diagnosis not present

## 2022-09-11 ENCOUNTER — Encounter: Payer: Self-pay | Admitting: *Deleted

## 2022-09-11 NOTE — Progress Notes (Signed)
[facility] Quality Team Note  Name: [name] Date of Birth: 10/12/2000 MRN: [id number] Date: 09/11/2022  [facility] Quality Team has reviewed this patient's chart, please see recommendations below:  [facility] Quality Other; Pt has open gap for KED measure.  EGFR completed.  Only needs Urine Albumin Creatinine Ratio test to close gap.  Would provider be able to order at 10/05/2022 ov?

## 2022-09-18 ENCOUNTER — Other Ambulatory Visit: Payer: Self-pay

## 2022-09-18 ENCOUNTER — Other Ambulatory Visit: Payer: Self-pay | Admitting: Nurse Practitioner

## 2022-09-18 ENCOUNTER — Other Ambulatory Visit (HOSPITAL_BASED_OUTPATIENT_CLINIC_OR_DEPARTMENT_OTHER): Payer: Self-pay

## 2022-09-18 ENCOUNTER — Other Ambulatory Visit (HOSPITAL_BASED_OUTPATIENT_CLINIC_OR_DEPARTMENT_OTHER): Payer: Self-pay | Admitting: Nurse Practitioner

## 2022-09-18 DIAGNOSIS — T148XXA Other injury of unspecified body region, initial encounter: Secondary | ICD-10-CM

## 2022-09-18 DIAGNOSIS — E559 Vitamin D deficiency, unspecified: Secondary | ICD-10-CM

## 2022-09-18 MED ORDER — MUPIROCIN 2 % EX OINT
TOPICAL_OINTMENT | CUTANEOUS | 1 refills | Status: AC
  Filled 2022-09-18: qty 110, 30d supply, fill #0
  Filled 2022-12-08 – 2022-12-09 (×3): qty 110, 30d supply, fill #1

## 2022-09-18 NOTE — Telephone Encounter (Signed)
Refill request do you want pt. To stay on this? Last apt 07/06/22 next apt 10/05/22.

## 2022-09-19 ENCOUNTER — Other Ambulatory Visit (HOSPITAL_BASED_OUTPATIENT_CLINIC_OR_DEPARTMENT_OTHER): Payer: Self-pay

## 2022-09-19 MED ORDER — VITAMIN D (ERGOCALCIFEROL) 1.25 MG (50000 UNIT) PO CAPS
50000.0000 [IU] | ORAL_CAPSULE | ORAL | 3 refills | Status: AC
  Filled 2022-09-19: qty 12, 84d supply, fill #0
  Filled 2022-11-21: qty 12, 84d supply, fill #1
  Filled 2023-02-13: qty 12, 84d supply, fill #2
  Filled 2023-04-25: qty 12, 84d supply, fill #3

## 2022-09-22 ENCOUNTER — Encounter (HOSPITAL_BASED_OUTPATIENT_CLINIC_OR_DEPARTMENT_OTHER): Payer: 59 | Attending: General Surgery | Admitting: General Surgery

## 2022-09-22 DIAGNOSIS — I10 Essential (primary) hypertension: Secondary | ICD-10-CM | POA: Insufficient documentation

## 2022-09-22 DIAGNOSIS — L97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: Secondary | ICD-10-CM | POA: Insufficient documentation

## 2022-09-22 DIAGNOSIS — L98492 Non-pressure chronic ulcer of skin 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: Secondary | ICD-10-CM | POA: Insufficient documentation

## 2022-09-22 DIAGNOSIS — Q8711 Prader-Willi syndrome: Secondary | ICD-10-CM | POA: Diagnosis not present

## 2022-09-22 DIAGNOSIS — E11622 Type 2 diabetes mellitus with other skin ulcer: Secondary | ICD-10-CM | POA: Diagnosis not present

## 2022-09-22 DIAGNOSIS — S50812A Abrasion of left forearm, initial encounter: Secondary | ICD-10-CM | POA: Diagnosis not present

## 2022-09-22 DIAGNOSIS — S60511A Abrasion of right hand, initial encounter: Secondary | ICD-10-CM | POA: Diagnosis not present

## 2022-09-22 DIAGNOSIS — S60512A Abrasion of left hand, initial encounter: Secondary | ICD-10-CM | POA: Diagnosis not present

## 2022-09-22 DIAGNOSIS — L97822 Non-pressure chronic ulcer of other part of left lower leg with fat layer exposed: Secondary | ICD-10-CM | POA: Diagnosis not present

## 2022-09-22 DIAGNOSIS — S71101A Unspecified open wound, right thigh, initial encounter: Secondary | ICD-10-CM | POA: Diagnosis not present

## 2022-09-22 NOTE — Progress Notes (Addendum)
09/22/2022 07:02:05 -------------------------------------------------------------------------------- Patient/Caregiver Education Details Patient Name: Date of Service: DO [name], [place] 6/28/2024andnbsp10:00 A M Medical Record Number: [id number] Patient Account Number: [id number] Date of Birth/Gender: Treating RN: 11/17/2000 (22 y.o. [name] Primary Care Physician: Enid Skeens Other Clinician: Referring Physician: Treating Physician/Extender: Arther Dames in Treatment: 17 Education Assessment Education Provided To: Patient Education Topics Provided Wound/Skin Impairment: Methods: Explain/Verbal Responses: Reinforcements needed, State content correctly Electronic Signature(s) Signed: 09/22/2022 3:50:53 PM By: Samuella Bruin Entered By: Samuella Bruin on 09/22/2022 08:12:24 -------------------------------------------------------------------------------- Wound Assessment Details Patient Name: Date of Service: [name], [name] 09/22/2022 10:00 A M Medical Record Number: [id number] Patient Account Number: [id number] Date of Birth/Sex: Treating RN: February 04, 2001 (22 y.o. [name] Primary Care Zeeva Courser: Enid Skeens Other Clinician: Referring Danelly Hassinger: Treating Demarie Uhlig/Extender: Arther Dames in Treatment: 17 Wound Status Wound Number: 1 Primary Etiology: Trauma, Other Wound Location: Right, Lateral Upper Leg Wound Status: Open Wounding Event: Trauma Comorbid History: Type II  Diabetes Date Acquired: 04/07/2022 Weeks Of Treatment: 17 Clustered Wound: No Photos CHATNEY, GREGORY R ([id number]) [id number] Page 7 of 13 Wound Measurements Length: (cm) 0.4 Width: (cm) 0.4 Depth: (cm) 2.8 Area: (cm) 0.126 Volume: (cm) 0.352 % Reduction in Area: 98.9% % Reduction in Volume: 99.3% Epithelialization: Small (1-33%) Tunneling: No Undermining: No Wound Description Classification: Full Thickness Without Exposed Support Structures Wound Margin: Distinct, outline attached Exudate Amount: Medium Exudate Type: Serosanguineous Exudate Color: red, brown Foul Odor After Cleansing: No Slough/Fibrino No Wound Bed Granulation Amount: Large (67-100%) Exposed Structure Granulation Quality: Red, Pink Fascia Exposed: No Necrotic Amount: None Present (0%) Fat Layer (Subcutaneous Tissue) Exposed: Yes Tendon Exposed: No Muscle Exposed: No Joint Exposed: No Bone Exposed: No Periwound Skin Texture Texture Color No Abnormalities Noted: No No Abnormalities Noted: Yes Scarring: Yes Temperature / Pain Temperature: No Abnormality Moisture No Abnormalities Noted: No Dry / Scaly: Yes Electronic Signature(s) Signed: 09/22/2022 3:50:53 PM By: Samuella Bruin Entered By: Samuella Bruin on 09/22/2022 07:19:06 -------------------------------------------------------------------------------- Wound Assessment Details Patient Name: Date of Service: [name], [name] 09/22/2022 10:00 A M Medical Record Number: [id number] Patient Account Number: [id number] Date of Birth/Sex: Treating RN: February 09, 2001 (22 y.o. [name] Primary Care Previn Jian: Enid Skeens Other Clinician: Referring Valarie Farace: Treating Yasamin Karel/Extender: Arther Dames in Treatment: 17 Wound Status Wound Number: 2 Primary Etiology: Abrasion Wound Location: Right Hand - Dorsum Wound Status: Open Wounding Event: Skin Tear/Laceration Comorbid History:  Type II Diabetes Date Acquired: 09/08/2022 Weeks Of Treatment: 0 Clustered Wound: No Fouty, Ozell R ([id number]) [id number].pdf Page 8 of 13 Photos Wound Measurements Length: (cm) 2 Width: (cm) 1.7 Depth: (cm) 0.1 Area: (cm) 2.67 Volume: (cm) 0.267 % Reduction in Area: % Reduction in Volume: Epithelialization: None Tunneling: No Undermining: No Wound Description Classification: Full Thickness Without Exposed Suppor Wound Margin: Distinct, outline attached Exudate Amount: Medium Exudate Type: Serous Exudate Color: amber t Structures Foul Odor After Cleansing: No Slough/Fibrino Yes Wound Bed Granulation Amount: Small (1-33%) Exposed Structure Granulation Quality: Red Fascia Exposed: No Necrotic Amount: Large (67-100%) Fat Layer (Subcutaneous Tissue) Exposed: Yes Necrotic Quality: Adherent Slough Tendon Exposed: No Muscle Exposed: No Joint Exposed: No Bone Exposed: No Periwound Skin Texture Texture Color No Abnormalities Noted: Yes No Abnormalities Noted: Yes Moisture Temperature / Pain No Abnormalities Noted: Yes Temperature: No Abnormality Electronic Signature(s) Signed: 09/22/2022 3:50:53 PM By: Samuella Bruin Entered By: Samuella Bruin on 09/22/2022 07:19:25 -------------------------------------------------------------------------------- Wound Assessment Details Patient  09/22/2022 07:02:05 -------------------------------------------------------------------------------- Patient/Caregiver Education Details Patient Name: Date of Service: DO [name], [place] 6/28/2024andnbsp10:00 A M Medical Record Number: [id number] Patient Account Number: [id number] Date of Birth/Gender: Treating RN: 11/17/2000 (22 y.o. [name] Primary Care Physician: Enid Skeens Other Clinician: Referring Physician: Treating Physician/Extender: Arther Dames in Treatment: 17 Education Assessment Education Provided To: Patient Education Topics Provided Wound/Skin Impairment: Methods: Explain/Verbal Responses: Reinforcements needed, State content correctly Electronic Signature(s) Signed: 09/22/2022 3:50:53 PM By: Samuella Bruin Entered By: Samuella Bruin on 09/22/2022 08:12:24 -------------------------------------------------------------------------------- Wound Assessment Details Patient Name: Date of Service: [name], [name] 09/22/2022 10:00 A M Medical Record Number: [id number] Patient Account Number: [id number] Date of Birth/Sex: Treating RN: February 04, 2001 (22 y.o. [name] Primary Care Zeeva Courser: Enid Skeens Other Clinician: Referring Danelly Hassinger: Treating Demarie Uhlig/Extender: Arther Dames in Treatment: 17 Wound Status Wound Number: 1 Primary Etiology: Trauma, Other Wound Location: Right, Lateral Upper Leg Wound Status: Open Wounding Event: Trauma Comorbid History: Type II  Diabetes Date Acquired: 04/07/2022 Weeks Of Treatment: 17 Clustered Wound: No Photos CHATNEY, GREGORY R ([id number]) [id number] Page 7 of 13 Wound Measurements Length: (cm) 0.4 Width: (cm) 0.4 Depth: (cm) 2.8 Area: (cm) 0.126 Volume: (cm) 0.352 % Reduction in Area: 98.9% % Reduction in Volume: 99.3% Epithelialization: Small (1-33%) Tunneling: No Undermining: No Wound Description Classification: Full Thickness Without Exposed Support Structures Wound Margin: Distinct, outline attached Exudate Amount: Medium Exudate Type: Serosanguineous Exudate Color: red, brown Foul Odor After Cleansing: No Slough/Fibrino No Wound Bed Granulation Amount: Large (67-100%) Exposed Structure Granulation Quality: Red, Pink Fascia Exposed: No Necrotic Amount: None Present (0%) Fat Layer (Subcutaneous Tissue) Exposed: Yes Tendon Exposed: No Muscle Exposed: No Joint Exposed: No Bone Exposed: No Periwound Skin Texture Texture Color No Abnormalities Noted: No No Abnormalities Noted: Yes Scarring: Yes Temperature / Pain Temperature: No Abnormality Moisture No Abnormalities Noted: No Dry / Scaly: Yes Electronic Signature(s) Signed: 09/22/2022 3:50:53 PM By: Samuella Bruin Entered By: Samuella Bruin on 09/22/2022 07:19:06 -------------------------------------------------------------------------------- Wound Assessment Details Patient Name: Date of Service: [name], [name] 09/22/2022 10:00 A M Medical Record Number: [id number] Patient Account Number: [id number] Date of Birth/Sex: Treating RN: February 09, 2001 (22 y.o. [name] Primary Care Previn Jian: Enid Skeens Other Clinician: Referring Valarie Farace: Treating Yasamin Karel/Extender: Arther Dames in Treatment: 17 Wound Status Wound Number: 2 Primary Etiology: Abrasion Wound Location: Right Hand - Dorsum Wound Status: Open Wounding Event: Skin Tear/Laceration Comorbid History:  Type II Diabetes Date Acquired: 09/08/2022 Weeks Of Treatment: 0 Clustered Wound: No Fouty, Ozell R ([id number]) [id number].pdf Page 8 of 13 Photos Wound Measurements Length: (cm) 2 Width: (cm) 1.7 Depth: (cm) 0.1 Area: (cm) 2.67 Volume: (cm) 0.267 % Reduction in Area: % Reduction in Volume: Epithelialization: None Tunneling: No Undermining: No Wound Description Classification: Full Thickness Without Exposed Suppor Wound Margin: Distinct, outline attached Exudate Amount: Medium Exudate Type: Serous Exudate Color: amber t Structures Foul Odor After Cleansing: No Slough/Fibrino Yes Wound Bed Granulation Amount: Small (1-33%) Exposed Structure Granulation Quality: Red Fascia Exposed: No Necrotic Amount: Large (67-100%) Fat Layer (Subcutaneous Tissue) Exposed: Yes Necrotic Quality: Adherent Slough Tendon Exposed: No Muscle Exposed: No Joint Exposed: No Bone Exposed: No Periwound Skin Texture Texture Color No Abnormalities Noted: Yes No Abnormalities Noted: Yes Moisture Temperature / Pain No Abnormalities Noted: Yes Temperature: No Abnormality Electronic Signature(s) Signed: 09/22/2022 3:50:53 PM By: Samuella Bruin Entered By: Samuella Bruin on 09/22/2022 07:19:25 -------------------------------------------------------------------------------- Wound Assessment Details Patient  1-33%) Small (1-33%) Small (1-33%) Necrotic A mount: Eschar Eschar, Adherent Slough Adherent Slough Necrotic Tissue: Fascia: No Fat Layer (Subcutaneous Tissue): Yes Fat Layer (Subcutaneous Tissue): Yes Exposed Structures: Fat Layer (Subcutaneous Tissue): No Fascia: No Fascia: No Tendon: No Tendon: No Tendon: No Muscle: No Muscle: No Muscle: No Joint: No Joint: No Joint: No Bone: No Bone: No Bone: No None Small (1-33%) Small (1-33%) Epithelialization: Debridement - Selective/Open  Wound Debridement - Selective/Open Wound Debridement - Selective/Open Wound Debridement: Pre-procedure Verification/Time Out 10:29 10:29 10:29 Taken: Lidocaine 4% Topical Solution Lidocaine 4% Topical Solution Lidocaine 4% Topical Solution Pain Control: Necrotic/Eschar, Ambulance person, Ambulance person, [employer] Tissue Debrided: Non-Viable Tissue Non-Viable Tissue Non-Viable Tissue Level: 0.82 2.3 0.27 Debridement A (sq cm): rea Curette Curette Curette Instrument: Minimum Minimum Minimum Bleeding: Pressure Pressure Pressure Hemostasis A chieved: Procedure was tolerated well Procedure was tolerated well Procedure was tolerated well Debridement Treatment Response: 1.5x0.7x0.1 1.5x6.5x0.1 0.5x0.7x0.1 Post Debridement Measurements L x W x D (cm) 0.082 0.766 0.027 Post Debridement Volume: (cm) No Abnormalities Noted No Abnormalities Noted Scarring: Yes Periwound Skin Texture: No Abnormalities Noted Dry/Scaly: Yes No Abnormalities Noted Periwound Skin Moisture: No Abnormalities Noted No Abnormalities Noted No Abnormalities Noted Periwound Skin Color: No Abnormality No Abnormality No Abnormality Temperature: Debridement Debridement Debridement Procedures Performed: Treatment Notes Electronic Signature(s) Signed: 09/22/2022 10:40:09 AM By: Duanne Guess MD FACS Entered By: Duanne Guess on 09/22/2022 07:40:09 -------------------------------------------------------------------------------- Multi-Disciplinary Care Plan Details Patient Name: Date of Service: [name], [name] 09/22/2022 10:00 A M Medical Record Number: [id number] Patient Account Number: [id number] Date of Birth/Sex: Treating RN: Jan 07, 2001 (21 y.o. [name] Primary Care Marketta Valadez: Enid Skeens Other Clinician: Referring Latisha Lasch: Treating Janit Cutter/Extender: Arther Dames in Treatment: 17 Active Inactive Necrotic Tissue Nursing Diagnoses: Impaired tissue  integrity related to necrotic/devitalized tissue Knowledge deficit related to management of necrotic/devitalized tissue Goals: Necrotic/devitalized tissue will be minimized in the wound bed Date Initiated: 05/23/2022 Target Resolution Date: 11/30/2022 Goal Status: Active Patient/caregiver will verbalize understanding of reason and process for debridement of necrotic tissue Date Initiated: 05/23/2022 Target Resolution Date: 11/30/2022 Goal Status: Active KIMANH, ROSILES ([id number]) [phone].pdf Page 5 of 13 Interventions: Assess patient pain level pre-, during and post procedure and prior to discharge Provide education on necrotic tissue and debridement process Treatment Activities: Apply topical anesthetic as ordered : 05/23/2022 Notes: Wound/Skin Impairment Nursing Diagnoses: Impaired tissue integrity Knowledge deficit related to ulceration/compromised skin integrity Goals: Patient/caregiver will verbalize understanding of skin care regimen Date Initiated: 05/23/2022 Target Resolution Date: 11/30/2022 Goal Status: Active Interventions: Assess patient/caregiver ability to perform ulcer/skin care regimen upon admission and as needed Assess ulceration(s) every visit Treatment Activities: Skin care regimen initiated : 05/23/2022 Topical wound management initiated : 05/23/2022 Notes: Electronic Signature(s) Signed: 09/22/2022 3:50:53 PM By: Samuella Bruin Entered By: Samuella Bruin on 09/22/2022 08:11:04 -------------------------------------------------------------------------------- Pain Assessment Details Patient Name: Date of Service: [name], [name] 09/22/2022 10:00 A M Medical Record Number: [id number] Patient Account Number: [id number] Date of Birth/Sex: Treating RN: 2000-07-04 (21 y.o. [name] Primary Care Keandrea Tapley: Enid Skeens Other Clinician: Referring Carleen Rhue: Treating Keean Wilmeth/Extender: Arther Dames in Treatment: 17 Active Problems Location of Pain Severity and Description of Pain Patient Has Paino No Site Locations Rate the pain. Current Pain Level: 0 Pain Management and Medication PURPOSE, BARCA R ([id number]) [id number] Page 6 of 13 Current Pain Management: Electronic Signature(s) Signed: 09/22/2022 3:50:53 PM By: Samuella Bruin Entered By: Samuella Bruin on  1-33%) Small (1-33%) Small (1-33%) Necrotic A mount: Eschar Eschar, Adherent Slough Adherent Slough Necrotic Tissue: Fascia: No Fat Layer (Subcutaneous Tissue): Yes Fat Layer (Subcutaneous Tissue): Yes Exposed Structures: Fat Layer (Subcutaneous Tissue): No Fascia: No Fascia: No Tendon: No Tendon: No Tendon: No Muscle: No Muscle: No Muscle: No Joint: No Joint: No Joint: No Bone: No Bone: No Bone: No None Small (1-33%) Small (1-33%) Epithelialization: Debridement - Selective/Open  Wound Debridement - Selective/Open Wound Debridement - Selective/Open Wound Debridement: Pre-procedure Verification/Time Out 10:29 10:29 10:29 Taken: Lidocaine 4% Topical Solution Lidocaine 4% Topical Solution Lidocaine 4% Topical Solution Pain Control: Necrotic/Eschar, Ambulance person, Ambulance person, [employer] Tissue Debrided: Non-Viable Tissue Non-Viable Tissue Non-Viable Tissue Level: 0.82 2.3 0.27 Debridement A (sq cm): rea Curette Curette Curette Instrument: Minimum Minimum Minimum Bleeding: Pressure Pressure Pressure Hemostasis A chieved: Procedure was tolerated well Procedure was tolerated well Procedure was tolerated well Debridement Treatment Response: 1.5x0.7x0.1 1.5x6.5x0.1 0.5x0.7x0.1 Post Debridement Measurements L x W x D (cm) 0.082 0.766 0.027 Post Debridement Volume: (cm) No Abnormalities Noted No Abnormalities Noted Scarring: Yes Periwound Skin Texture: No Abnormalities Noted Dry/Scaly: Yes No Abnormalities Noted Periwound Skin Moisture: No Abnormalities Noted No Abnormalities Noted No Abnormalities Noted Periwound Skin Color: No Abnormality No Abnormality No Abnormality Temperature: Debridement Debridement Debridement Procedures Performed: Treatment Notes Electronic Signature(s) Signed: 09/22/2022 10:40:09 AM By: Duanne Guess MD FACS Entered By: Duanne Guess on 09/22/2022 07:40:09 -------------------------------------------------------------------------------- Multi-Disciplinary Care Plan Details Patient Name: Date of Service: [name], [name] 09/22/2022 10:00 A M Medical Record Number: [id number] Patient Account Number: [id number] Date of Birth/Sex: Treating RN: Jan 07, 2001 (21 y.o. [name] Primary Care Marketta Valadez: Enid Skeens Other Clinician: Referring Latisha Lasch: Treating Janit Cutter/Extender: Arther Dames in Treatment: 17 Active Inactive Necrotic Tissue Nursing Diagnoses: Impaired tissue  integrity related to necrotic/devitalized tissue Knowledge deficit related to management of necrotic/devitalized tissue Goals: Necrotic/devitalized tissue will be minimized in the wound bed Date Initiated: 05/23/2022 Target Resolution Date: 11/30/2022 Goal Status: Active Patient/caregiver will verbalize understanding of reason and process for debridement of necrotic tissue Date Initiated: 05/23/2022 Target Resolution Date: 11/30/2022 Goal Status: Active KIMANH, ROSILES ([id number]) [phone].pdf Page 5 of 13 Interventions: Assess patient pain level pre-, during and post procedure and prior to discharge Provide education on necrotic tissue and debridement process Treatment Activities: Apply topical anesthetic as ordered : 05/23/2022 Notes: Wound/Skin Impairment Nursing Diagnoses: Impaired tissue integrity Knowledge deficit related to ulceration/compromised skin integrity Goals: Patient/caregiver will verbalize understanding of skin care regimen Date Initiated: 05/23/2022 Target Resolution Date: 11/30/2022 Goal Status: Active Interventions: Assess patient/caregiver ability to perform ulcer/skin care regimen upon admission and as needed Assess ulceration(s) every visit Treatment Activities: Skin care regimen initiated : 05/23/2022 Topical wound management initiated : 05/23/2022 Notes: Electronic Signature(s) Signed: 09/22/2022 3:50:53 PM By: Samuella Bruin Entered By: Samuella Bruin on 09/22/2022 08:11:04 -------------------------------------------------------------------------------- Pain Assessment Details Patient Name: Date of Service: [name], [name] 09/22/2022 10:00 A M Medical Record Number: [id number] Patient Account Number: [id number] Date of Birth/Sex: Treating RN: 2000-07-04 (21 y.o. [name] Primary Care Keandrea Tapley: Enid Skeens Other Clinician: Referring Carleen Rhue: Treating Keean Wilmeth/Extender: Arther Dames in Treatment: 17 Active Problems Location of Pain Severity and Description of Pain Patient Has Paino No Site Locations Rate the pain. Current Pain Level: 0 Pain Management and Medication PURPOSE, BARCA R ([id number]) [id number] Page 6 of 13 Current Pain Management: Electronic Signature(s) Signed: 09/22/2022 3:50:53 PM By: Samuella Bruin Entered By: Samuella Bruin on  09/22/2022 07:02:05 -------------------------------------------------------------------------------- Patient/Caregiver Education Details Patient Name: Date of Service: DO [name], [place] 6/28/2024andnbsp10:00 A M Medical Record Number: [id number] Patient Account Number: [id number] Date of Birth/Gender: Treating RN: 11/17/2000 (22 y.o. [name] Primary Care Physician: Enid Skeens Other Clinician: Referring Physician: Treating Physician/Extender: Arther Dames in Treatment: 17 Education Assessment Education Provided To: Patient Education Topics Provided Wound/Skin Impairment: Methods: Explain/Verbal Responses: Reinforcements needed, State content correctly Electronic Signature(s) Signed: 09/22/2022 3:50:53 PM By: Samuella Bruin Entered By: Samuella Bruin on 09/22/2022 08:12:24 -------------------------------------------------------------------------------- Wound Assessment Details Patient Name: Date of Service: [name], [name] 09/22/2022 10:00 A M Medical Record Number: [id number] Patient Account Number: [id number] Date of Birth/Sex: Treating RN: February 04, 2001 (22 y.o. [name] Primary Care Zeeva Courser: Enid Skeens Other Clinician: Referring Danelly Hassinger: Treating Demarie Uhlig/Extender: Arther Dames in Treatment: 17 Wound Status Wound Number: 1 Primary Etiology: Trauma, Other Wound Location: Right, Lateral Upper Leg Wound Status: Open Wounding Event: Trauma Comorbid History: Type II  Diabetes Date Acquired: 04/07/2022 Weeks Of Treatment: 17 Clustered Wound: No Photos CHATNEY, GREGORY R ([id number]) [id number] Page 7 of 13 Wound Measurements Length: (cm) 0.4 Width: (cm) 0.4 Depth: (cm) 2.8 Area: (cm) 0.126 Volume: (cm) 0.352 % Reduction in Area: 98.9% % Reduction in Volume: 99.3% Epithelialization: Small (1-33%) Tunneling: No Undermining: No Wound Description Classification: Full Thickness Without Exposed Support Structures Wound Margin: Distinct, outline attached Exudate Amount: Medium Exudate Type: Serosanguineous Exudate Color: red, brown Foul Odor After Cleansing: No Slough/Fibrino No Wound Bed Granulation Amount: Large (67-100%) Exposed Structure Granulation Quality: Red, Pink Fascia Exposed: No Necrotic Amount: None Present (0%) Fat Layer (Subcutaneous Tissue) Exposed: Yes Tendon Exposed: No Muscle Exposed: No Joint Exposed: No Bone Exposed: No Periwound Skin Texture Texture Color No Abnormalities Noted: No No Abnormalities Noted: Yes Scarring: Yes Temperature / Pain Temperature: No Abnormality Moisture No Abnormalities Noted: No Dry / Scaly: Yes Electronic Signature(s) Signed: 09/22/2022 3:50:53 PM By: Samuella Bruin Entered By: Samuella Bruin on 09/22/2022 07:19:06 -------------------------------------------------------------------------------- Wound Assessment Details Patient Name: Date of Service: [name], [name] 09/22/2022 10:00 A M Medical Record Number: [id number] Patient Account Number: [id number] Date of Birth/Sex: Treating RN: February 09, 2001 (22 y.o. [name] Primary Care Previn Jian: Enid Skeens Other Clinician: Referring Valarie Farace: Treating Yasamin Karel/Extender: Arther Dames in Treatment: 17 Wound Status Wound Number: 2 Primary Etiology: Abrasion Wound Location: Right Hand - Dorsum Wound Status: Open Wounding Event: Skin Tear/Laceration Comorbid History:  Type II Diabetes Date Acquired: 09/08/2022 Weeks Of Treatment: 0 Clustered Wound: No Fouty, Ozell R ([id number]) [id number].pdf Page 8 of 13 Photos Wound Measurements Length: (cm) 2 Width: (cm) 1.7 Depth: (cm) 0.1 Area: (cm) 2.67 Volume: (cm) 0.267 % Reduction in Area: % Reduction in Volume: Epithelialization: None Tunneling: No Undermining: No Wound Description Classification: Full Thickness Without Exposed Suppor Wound Margin: Distinct, outline attached Exudate Amount: Medium Exudate Type: Serous Exudate Color: amber t Structures Foul Odor After Cleansing: No Slough/Fibrino Yes Wound Bed Granulation Amount: Small (1-33%) Exposed Structure Granulation Quality: Red Fascia Exposed: No Necrotic Amount: Large (67-100%) Fat Layer (Subcutaneous Tissue) Exposed: Yes Necrotic Quality: Adherent Slough Tendon Exposed: No Muscle Exposed: No Joint Exposed: No Bone Exposed: No Periwound Skin Texture Texture Color No Abnormalities Noted: Yes No Abnormalities Noted: Yes Moisture Temperature / Pain No Abnormalities Noted: Yes Temperature: No Abnormality Electronic Signature(s) Signed: 09/22/2022 3:50:53 PM By: Samuella Bruin Entered By: Samuella Bruin on 09/22/2022 07:19:25 -------------------------------------------------------------------------------- Wound Assessment Details Patient  1-33%) Small (1-33%) Small (1-33%) Necrotic A mount: Eschar Eschar, Adherent Slough Adherent Slough Necrotic Tissue: Fascia: No Fat Layer (Subcutaneous Tissue): Yes Fat Layer (Subcutaneous Tissue): Yes Exposed Structures: Fat Layer (Subcutaneous Tissue): No Fascia: No Fascia: No Tendon: No Tendon: No Tendon: No Muscle: No Muscle: No Muscle: No Joint: No Joint: No Joint: No Bone: No Bone: No Bone: No None Small (1-33%) Small (1-33%) Epithelialization: Debridement - Selective/Open  Wound Debridement - Selective/Open Wound Debridement - Selective/Open Wound Debridement: Pre-procedure Verification/Time Out 10:29 10:29 10:29 Taken: Lidocaine 4% Topical Solution Lidocaine 4% Topical Solution Lidocaine 4% Topical Solution Pain Control: Necrotic/Eschar, Ambulance person, Ambulance person, [employer] Tissue Debrided: Non-Viable Tissue Non-Viable Tissue Non-Viable Tissue Level: 0.82 2.3 0.27 Debridement A (sq cm): rea Curette Curette Curette Instrument: Minimum Minimum Minimum Bleeding: Pressure Pressure Pressure Hemostasis A chieved: Procedure was tolerated well Procedure was tolerated well Procedure was tolerated well Debridement Treatment Response: 1.5x0.7x0.1 1.5x6.5x0.1 0.5x0.7x0.1 Post Debridement Measurements L x W x D (cm) 0.082 0.766 0.027 Post Debridement Volume: (cm) No Abnormalities Noted No Abnormalities Noted Scarring: Yes Periwound Skin Texture: No Abnormalities Noted Dry/Scaly: Yes No Abnormalities Noted Periwound Skin Moisture: No Abnormalities Noted No Abnormalities Noted No Abnormalities Noted Periwound Skin Color: No Abnormality No Abnormality No Abnormality Temperature: Debridement Debridement Debridement Procedures Performed: Treatment Notes Electronic Signature(s) Signed: 09/22/2022 10:40:09 AM By: Duanne Guess MD FACS Entered By: Duanne Guess on 09/22/2022 07:40:09 -------------------------------------------------------------------------------- Multi-Disciplinary Care Plan Details Patient Name: Date of Service: [name], [name] 09/22/2022 10:00 A M Medical Record Number: [id number] Patient Account Number: [id number] Date of Birth/Sex: Treating RN: Jan 07, 2001 (21 y.o. [name] Marketta Valadez: Enid Skeens Other Clinician: Referring Latisha Lasch: Treating Janit Cutter/Extender: Arther Dames in Treatment: 17 Active Inactive Necrotic Tissue Nursing Diagnoses: Impaired tissue  integrity related to necrotic/devitalized tissue Knowledge deficit related to management of necrotic/devitalized tissue Goals: Necrotic/devitalized tissue will be minimized in the wound bed Date Initiated: 05/23/2022 Target Resolution Date: 11/30/2022 Goal Status: Active Patient/caregiver will verbalize understanding of reason and process for debridement of necrotic tissue Date Initiated: 05/23/2022 Target Resolution Date: 11/30/2022 Goal Status: Active KIMANH, ROSILES ([id number]) [phone].pdf Page 5 of 13 Interventions: Assess patient pain level pre-, during and post procedure and prior to discharge Provide education on necrotic tissue and debridement process Treatment Activities: Apply topical anesthetic as ordered : 05/23/2022 Notes: Wound/Skin Impairment Nursing Diagnoses: Impaired tissue integrity Knowledge deficit related to ulceration/compromised skin integrity Goals: Patient/caregiver will verbalize understanding of skin care regimen Date Initiated: 05/23/2022 Target Resolution Date: 11/30/2022 Goal Status: Active Interventions: Assess patient/caregiver ability to perform ulcer/skin care regimen upon admission and as needed Assess ulceration(s) every visit Treatment Activities: Skin care regimen initiated : 05/23/2022 Topical wound management initiated : 05/23/2022 Notes: Electronic Signature(s) Signed: 09/22/2022 3:50:53 PM By: Samuella Bruin Entered By: Samuella Bruin on 09/22/2022 08:11:04 -------------------------------------------------------------------------------- Pain Assessment Details Patient Name: Date of Service: [name], [name] 09/22/2022 10:00 A M Medical Record Number: [id number] Patient Account Number: [id number] Date of Birth/Sex: Treating RN: 2000-07-04 (21 y.o. [name] Keandrea Tapley: Enid Skeens Other Clinician: Referring Carleen Rhue: Treating Keean Wilmeth/Extender: Arther Dames in Treatment: 17 Active Problems Location of Pain Severity and Description of Pain Patient Has Paino No Site Locations Rate the pain. Current Pain Level: 0 Pain Management and Medication PURPOSE, BARCA R ([id number]) [id number] Page 6 of 13 Current Pain Management: Electronic Signature(s) Signed: 09/22/2022 3:50:53 PM By: Samuella Bruin Entered By: Samuella Bruin on

## 2022-09-22 NOTE — Progress Notes (Signed)
[name], [name] ([id number]) [id number] Page 1 of 15 Visit Report for 09/22/2022 Chief Complaint Document Details Patient Name: Date of Service: DO [place], [place] 09/22/2022 10:00 A M Medical Record Number: [id number] Patient Account Number: [id number] Date of Birth/Sex: Treating RN: 06-26-2000 (22 y.o. F) Primary Care Provider: Enid Skeens Other Clinician: Referring Provider: Treating Provider/Extender: Arther Dames in Treatment: 17 Information Obtained from: Patient Chief Complaint Patient seen for complaints of Non-Healing Wound. Electronic Signature(s) Signed: 09/22/2022 10:40:17 AM By: Duanne Guess MD FACS Entered By: Duanne Guess on 09/22/2022 10:40:17 -------------------------------------------------------------------------------- Debridement Details Patient Name: Date of Service: [name], MA [name]. 09/22/2022 10:00 A M Medical Record Number: [id number] Patient Account Number: [id number] Date of Birth/Sex: Treating RN: 25-Jun-2000 (22 y.o. [name] Primary Care Provider: Enid Skeens Other Clinician: Referring Provider: Treating Provider/Extender: Arther Dames in Treatment: 17 Debridement Performed for Assessment: Wound #6 Left,Anterior Lower Leg Performed By: Physician Duanne Guess, MD Debridement Type: Debridement Level of Consciousness (Pre-procedure): Awake and Alert Pre-procedure Verification/Time Out Yes - 10:29 Taken: Start Time: 10:29 Pain Control: Lidocaine 4% Topical Solution Percent of Wound Bed Debrided: 100% T Area Debrided (cm): otal 0.27 Tissue and other material debrided: Non-Viable, Eschar, Slough, Slough Level: Non-Viable Tissue Debridement Description: Selective/Open Wound Instrument: Curette Bleeding: Minimum Hemostasis Achieved: Pressure Response to Treatment: Procedure was tolerated well Level of Consciousness (Post- Awake and  Alert procedure): Post Debridement Measurements of Total Wound Length: (cm) 0.5 Width: (cm) 0.7 Depth: (cm) 0.1 Volume: (cm) 0.027 Character of Wound/Ulcer Post Debridement: Improved Post Procedure Diagnosis Same as Pre-procedure [name], [name] ([id number]) [id number] Page 2 of 15 Notes scribed for Dr. Lady Gary by Samuella Bruin, RN Electronic Signature(s) Signed: 09/22/2022 10:46:53 AM By: Duanne Guess MD FACS Signed: 09/22/2022 3:50:53 PM By: Gelene Mink By: Samuella Bruin on 09/22/2022 10:29:42 -------------------------------------------------------------------------------- Debridement Details Patient Name: Date of Service: [name], MA [name]. 09/22/2022 10:00 A M Medical Record Number: [id number] Patient Account Number: [id number] Date of Birth/Sex: Treating RN: Jul 02, 2000 (22 y.o. [name] Primary Care Provider: Enid Skeens Other Clinician: Referring Provider: Treating Provider/Extender: Arther Dames in Treatment: 17 Debridement Performed for Assessment: Wound #5 Left Shoulder Performed By: Physician Duanne Guess, MD Debridement Type: Debridement Level of Consciousness (Pre-procedure): Awake and Alert Pre-procedure Verification/Time Out Yes - 10:29 Taken: Start Time: 10:29 Pain Control: Lidocaine 4% Topical Solution Percent of Wound Bed Debrided: 30% T Area Debrided (cm): otal 2.3 Tissue and other material debrided: Non-Viable, Eschar, Slough, Slough Level: Non-Viable Tissue Debridement Description: Selective/Open Wound Instrument: Curette Bleeding: Minimum Hemostasis Achieved: Pressure Response to Treatment: Procedure was tolerated well Level of Consciousness (Post- Awake and Alert procedure): Post Debridement Measurements of Total Wound Length: (cm) 1.5 Width: (cm) 6.5 Depth: (cm) 0.1 Volume: (cm) 0.766 Character of Wound/Ulcer Post Debridement: Improved Post  Procedure Diagnosis Same as Pre-procedure Notes scribed for Dr. Lady Gary by Samuella Bruin, RN Electronic Signature(s) Signed: 09/22/2022 10:46:53 AM By: Duanne Guess MD FACS Signed: 09/22/2022 3:50:53 PM By: Samuella Bruin Entered By: Samuella Bruin on 09/22/2022 10:30:36 -------------------------------------------------------------------------------- Debridement Details Patient Name: Date of Service: [name], MA [name]. 09/22/2022 10:00 A M Medical Record Number: [id number] Patient Account Number: [id number] [name], [name] ([id number]) [id number] Page 3 of 15 Date of Birth/Sex: Treating RN: Aug 21, 2000 (22 y.o. [name] Primary Care Provider: Enid Skeens Other Clinician: Referring Provider: Treating Provider/Extender: Arther Dames in Treatment: [phone]  Debridement Performed for Assessment: Wound #4 Left Forearm Performed By: Physician Duanne Guess, MD Debridement Type: Debridement Level of Consciousness (Pre-procedure): Awake and Alert Pre-procedure Verification/Time Out Yes - 10:29 Taken: Start Time: 10:29 Pain Control: Lidocaine 4% Topical Solution Percent of Wound Bed Debrided: 100% T Area Debrided (cm): otal 0.82 Tissue and other material debrided: Non-Viable, Eschar, Slough, Slough Level: Non-Viable Tissue Debridement Description: Selective/Open Wound Instrument: Curette Bleeding: Minimum Hemostasis Achieved: Pressure Response to Treatment: Procedure was tolerated well Level of Consciousness (Post- Awake and Alert procedure): Post Debridement Measurements of Total Wound Length: (cm) 1.5 Width: (cm) 0.7 Depth: (cm) 0.1 Volume: (cm) 0.082 Character of Wound/Ulcer Post Debridement: Improved Post Procedure Diagnosis Same as Pre-procedure Notes scribed for Dr. Lady Gary by Samuella Bruin, RN Electronic Signature(s) Signed: 09/22/2022 10:46:53 AM By: Duanne Guess MD FACS Signed:  09/22/2022 3:50:53 PM By: Samuella Bruin Entered By: Samuella Bruin on 09/22/2022 10:33:06 -------------------------------------------------------------------------------- Debridement Details Patient Name: Date of Service: [name], MA [name]. 09/22/2022 10:00 A M Medical Record Number: [id number] Patient Account Number: [id number] Date of Birth/Sex: Treating RN: 2001-01-19 (22 y.o. [name] Primary Care Provider: Enid Skeens Other Clinician: Referring Provider: Treating Provider/Extender: Arther Dames in Treatment: 17 Debridement Performed for Assessment: Wound #2 Right Hand - Dorsum Performed By: Physician Duanne Guess, MD Debridement Type: Debridement Level of Consciousness (Pre-procedure): Awake and Alert Pre-procedure Verification/Time Out Yes - 10:29 Taken: Start Time: 10:29 Pain Control: Lidocaine 4% Topical Solution Percent of Wound Bed Debrided: 100% T Area Debrided (cm): otal 2.67 Tissue and other material debrided: Non-Viable, Eschar, Slough, Slough Level: Non-Viable Tissue Debridement Description: Selective/Open Wound Instrument: Curette Bleeding: Minimum Hemostasis Achieved: Pressure Response to Treatment: Procedure was tolerated well Level of Consciousness [name], [name] ([id number]) [id number] Page 4 of 15 Level of Consciousness (Post- Awake and Alert procedure): Post Debridement Measurements of Total Wound Length: (cm) 2 Width: (cm) 1.7 Depth: (cm) 0.1 Volume: (cm) 0.267 Character of Wound/Ulcer Post Debridement: Improved Post Procedure Diagnosis Same as Pre-procedure Notes scribed for Dr. Lady Gary by Samuella Bruin, RN Electronic Signature(s) Signed: 09/22/2022 10:46:53 AM By: Duanne Guess MD FACS Signed: 09/22/2022 3:50:53 PM By: Samuella Bruin Entered By: Samuella Bruin on 09/22/2022  10:33:22 -------------------------------------------------------------------------------- Debridement Details Patient Name: Date of Service: [name], MA [name]. 09/22/2022 10:00 A M Medical Record Number: [id number] Patient Account Number: [id number] Date of Birth/Sex: Treating RN: 30-Sep-2000 (22 y.o. [name] Primary Care Provider: Enid Skeens Other Clinician: Referring Provider: Treating Provider/Extender: Arther Dames in Treatment: 17 Debridement Performed for Assessment: Wound #3 Left Hand - Dorsum Performed By: Physician Duanne Guess, MD Debridement Type: Debridement Level of Consciousness (Pre-procedure): Awake and Alert Pre-procedure Verification/Time Out Yes - 10:29 Taken: Start Time: 10:29 Pain Control: Lidocaine 4% Topical Solution Percent of Wound Bed Debrided: 100% T Area Debrided (cm): otal 0.71 Tissue and other material debrided: Non-Viable, Eschar, Slough, Slough Level: Non-Viable Tissue Debridement Description: Selective/Open Wound Instrument: Curette Bleeding: Minimum Hemostasis Achieved: Pressure Response to Treatment: Procedure was tolerated well Level of Consciousness (Post- Awake and Alert procedure): Post Debridement Measurements of Total Wound Length: (cm) 1.5 Width: (cm) 0.6 Depth: (cm) 0.1 Volume: (cm) 0.071 Character of Wound/Ulcer Post Debridement: Improved Post Procedure Diagnosis Same as Pre-procedure Notes scribed for Dr. Lady Gary by Samuella Bruin, RN Electronic Signature(s) Signed: 09/22/2022 10:46:53 AM By: Duanne Guess MD FACS Signed: 09/22/2022 3:50:53 PM By: Samuella Bruin Entered By: Samuella Bruin on 09/22/2022 10:33:41 [name], [name] ([id number]) [id number]  Page 5 of 15 -------------------------------------------------------------------------------- Debridement Details Patient Name: Date of Service: DO [name], [place] Black. 09/22/2022 10:00 A  M Medical Record Number: [id number] Patient Account Number: [id number] Date of Birth/Sex: Treating RN: 06/18/2000 (22 y.o. [name] Primary Care Provider: Enid Skeens Other Clinician: Referring Provider: Treating Provider/Extender: Arther Dames in Treatment: 17 Debridement Performed for Assessment: Wound #1 Right,Lateral Upper Leg Performed By: Physician Duanne Guess, MD Debridement Type: Debridement Level of Consciousness (Pre-procedure): Awake and Alert Pre-procedure Verification/Time Out Yes - 10:29 Taken: Start Time: 10:29 Pain Control: Lidocaine 4% T opical Solution Percent of Wound Bed Debrided: 100% T Area Debrided (cm): otal 0.13 Tissue and other material debrided: Non-Viable, Slough, Slough Level: Non-Viable Tissue Debridement Description: Selective/Open Wound Instrument: Curette Bleeding: Minimum Hemostasis Achieved: Pressure Response to Treatment: Procedure was tolerated well Level of Consciousness (Post- Awake and Alert procedure): Post Debridement Measurements of Total Wound Length: (cm) 0.4 Width: (cm) 0.4 Depth: (cm) 2.8 Volume: (cm) 0.352 Character of Wound/Ulcer Post Debridement: Improved Post Procedure Diagnosis Same as Pre-procedure Notes scribed for Dr. Lady Gary by Samuella Bruin, RN Electronic Signature(s) Signed: 09/22/2022 10:46:53 AM By: Duanne Guess MD FACS Signed: 09/22/2022 3:50:53 PM By: Samuella Bruin Entered By: Samuella Bruin on 09/22/2022 10:35:00 -------------------------------------------------------------------------------- HPI Details Patient Name: Date of Service: [name], MA [name]. 09/22/2022 10:00 A M Medical Record Number: [id number] Patient Account Number: [id number] Date of Birth/Sex: Treating RN: [birth year] (22 y.o. 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 She picked a large wound in her right thigh that resulted in an emergency department visit on 17 February. Apparently her father was cleaning the wound with mupirocin and hydrogen peroxide. A culture was taken that grew out Proteus and methicillin sensitive Staph aureus. She was prescribed levofloxacin. She saw her primary care provider a few days later and based on the depth and appearance of the wound, she was referred to the wound care center for further evaluation and management. There is a large area on the patient's upper right thigh that is excoriated and scaly with dry skin that has obviously been picked at. The primary wound is 4-1/2 cm in depth. It is much cleaner than described in the ED notes. There is also a linear ulcer adjacent to this deep site with slough accumulation. 3/15; patient presents for follow-up. She has been using iodoform packing followed by silver alginate to the wound bed. Mother is present during the encounter. There is been improvement in wound healing. 06/23/2022: The depth of the wound has come in further. The periwound skin is in improved condition with very few open areas. 07/21/2022: The wound continues to contract. Her periwound skin has completely healed. 08/18/2022: The wound measured a couple of millimeters deeper today. No concern for infection. 09/22/2022: Unfortunately, over the past month, [name] has engaged in significant skin picking activity. She has wounds on each of her wrists, 1 on her right dorsal hand, 1 on her left shoulder, and 1 on her left anterior tibial surface. The initial wound on her right thigh is a little bit shallower. No concern for infection  at any of the sites. Electronic Signature(s) Signed: 09/22/2022 10:41:36 AM By: Lady Gary,  Victorino Dike MD FACS Entered By: Duanne Guess on 09/22/2022 10:41:36 -------------------------------------------------------------------------------- Physical Exam Details Patient Name: Date of Service: DO [name], [place] Black. 09/22/2022 10:00 A M Medical Record Number: [id number] Patient Account Number: [id number] Date of Birth/Sex: Treating RN: 2000-10-24 (22 y.o. F) Primary Care Provider: Enid Skeens Other Clinician: Referring Provider: Treating Provider/Extender: Arther Dames in Treatment: 17 Constitutional Hypertensive, asymptomatic. . . . no acute distress. Respiratory Normal work of breathing on room air. Notes 09/22/2022: Unfortunately, over the past month, [name] has engaged in significant skin picking activity. She has wounds on each of her wrists, 1 on her right dorsal hand, 1 on her left shoulder, and 1 on her left anterior tibial surface. There is slough and eschar accumulation at all of these sites. The initial wound on her right thigh is a little bit shallower. No concern for infection at any of the sites. Electronic Signature(s) Signed: 09/22/2022 10:42:25 AM By: Duanne Guess MD FACS Entered By: Duanne Guess on 09/22/2022 10:42:25 -------------------------------------------------------------------------------- Physician Orders Details Patient Name: Date of Service: [name], MA [name]. 09/22/2022 10:00 A M Medical Record Number: [id number] Patient Account Number: [id number] Date of Birth/Sex: Treating RN: May 27, 2000 (22 y.o. 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 - Dr. Lady Gary - room 2 Anesthetic (In clinic) Topical Lidocaine 4% applied to wound bed Bathing/ Shower/ Hygiene May shower and wash wound with soap and water. Wound Treatment Wound #1 - Upper Leg Wound Laterality: Right, Lateral Cleanser: Normal Saline (DME) (Generic) 1 x Per Day/30 Days Discharge Instructions: Cleanse the wound with Normal Saline prior to applying a clean dressing using gauze sponges, not tissue or cotton balls. Cleanser: Soap and Water 1 x Per Day/30 Days Discharge Instructions: May shower and wash wound with dial antibacterial soap and water prior to dressing change. Cleanser: Wound Cleanser (Generic) 1 x Per Day/30 Days Discharge Instructions: Cleanse the wound with wound cleanser prior to applying a clean dressing using gauze sponges, not tissue or cotton balls. Peri-Wound Care: Skin Prep (Generic) 1 x Per Day/30 Days Discharge Instructions: Use skin prep as directed Peri-Wound Care: Sween Lotion (Moisturizing lotion) 1 x Per Day/30 Days Discharge Instructions: Apply moisturizing lotion as directed Prim Dressing: Hydrofera Blue Classic Foam Rope Dressing, 9x6 (mm/in) (Generic) 1 x Per Day/30 Days ary Discharge Instructions: Moisten with saline prior to packing Secondary Dressing: Woven Gauze Sponge, Non-Sterile 4x4 in (DME) (Generic) 1 x Per Day/30 Days Discharge Instructions: Apply over primary dressing as directed. Secondary Dressing: Zetuvit Plus Silicone Border Dressing 4x4 (in/in) (DME)  (Generic) 1 x Per Day/30 Days Discharge Instructions: Apply silicone border over primary dressing as directed. Secured With: Paper Tape, 2x10 (in/yd) (DME) (Generic) 1 x Per Day/30 Days Discharge Instructions: Secure dressing with tape as directed. Wound #2 - Hand - Dorsum Wound Laterality: Right Cleanser: Soap and Water 1 x Per Day/30 Days  Discharge Instructions: May shower and wash wound with dial antibacterial soap and water prior to dressing change. Cleanser: Wound Cleanser 1 x Per Day/30 Days Discharge Instructions: Cleanse the wound with wound cleanser prior to applying a clean dressing using gauze sponges, not tissue or cotton balls. Topical: Mupirocin Ointment 1 x Per Day/30 Days Discharge Instructions: Apply Mupirocin (Bactroban) as instructed Prim Dressing: Maxorb Extra Ag+ Alginate Dressing, 2x2 (in/in) (DME) (Generic) 1 x Per Day/30 Days ary Discharge Instructions: Apply to wound bed as instructed Secondary Dressing: Zetuvit Plus Silicone Border Dressing 4x4 (in/in) (DME) (Generic) 1 x Per Day/30 Days Discharge Instructions: Apply silicone border over primary dressing as directed. Wound #3 - Hand - Dorsum Wound Laterality: Left Cleanser: Soap and Water 1 x Per Day/30 Days Discharge Instructions: May shower and wash wound with dial antibacterial soap and water prior to dressing change. Cleanser: Wound Cleanser 1 x Per Day/30 Days Discharge Instructions: Cleanse the wound with wound cleanser prior to applying a clean dressing using gauze sponges, not tissue or cotton balls. Topical: Mupirocin Ointment 1 x Per Day/30 Days [name], [name] ([id number]) [phone].pdf Page 8 of 15 Discharge Instructions: Apply Mupirocin (Bactroban) as instructed Prim Dressing: Maxorb Extra Ag+ Alginate Dressing, 2x2 (in/in) (DME) (Generic) 1 x Per Day/30 Days ary Discharge Instructions: Apply to wound bed as instructed Secondary Dressing: Zetuvit Plus Silicone Border  Dressing 4x4 (in/in) (DME) (Generic) 1 x Per Day/30 Days Discharge Instructions: Apply silicone border over primary dressing as directed. Wound #4 - Forearm Wound Laterality: Left Cleanser: Soap and Water 1 x Per Day/30 Days Discharge Instructions: May shower and wash wound with dial antibacterial soap and water prior to dressing change. Cleanser: Wound Cleanser 1 x Per Day/30 Days Discharge Instructions: Cleanse the wound with wound cleanser prior to applying a clean dressing using gauze sponges, not tissue or cotton balls. Topical: Mupirocin Ointment 1 x Per Day/30 Days Discharge Instructions: Apply Mupirocin (Bactroban) as instructed Prim Dressing: Maxorb Extra Ag+ Alginate Dressing, 2x2 (in/in) (DME) (Generic) 1 x Per Day/30 Days ary Discharge Instructions: Apply to wound bed as instructed Secondary Dressing: Zetuvit Plus Silicone Border Dressing 4x4 (in/in) (DME) (Generic) 1 x Per Day/30 Days Discharge Instructions: Apply silicone border over primary dressing as directed. Wound #5 - Shoulder Wound Laterality: Left Cleanser: Soap and Water 1 x Per Day/30 Days Discharge Instructions: May shower and wash wound with dial antibacterial soap and water prior to dressing change. Cleanser: Wound Cleanser 1 x Per Day/30 Days Discharge Instructions: Cleanse the wound with wound cleanser prior to applying a clean dressing using gauze sponges, not tissue or cotton balls. Topical: Mupirocin Ointment 1 x Per Day/30 Days Discharge Instructions: Apply Mupirocin (Bactroban) as instructed Prim Dressing: Maxorb Extra Ag+ Alginate Dressing, 2x2 (in/in) (DME) (Generic) 1 x Per Day/30 Days ary Discharge Instructions: Apply to wound bed as instructed Secondary Dressing: Zetuvit Plus Silicone Border Dressing 5x5 (in/in) (DME) (Generic) 1 x Per Day/30 Days Discharge Instructions: Apply silicone border over primary dressing as directed. Wound #6 - Lower Leg Wound Laterality: Left, Anterior Cleanser: Soap and  Water 1 x Per Day/30 Days Discharge Instructions: May shower and wash wound with dial antibacterial soap and water prior to dressing change. Cleanser: Wound Cleanser 1 x Per Day/30 Days Discharge Instructions: Cleanse the wound with wound cleanser prior to applying a clean dressing using gauze sponges, not tissue or cotton balls. Topical: Mupirocin Ointment 1 x Per Day/30 Days Discharge Instructions: Apply Mupirocin (Bactroban) as instructed Prim Dressing: Maxorb Extra Ag+ Alginate Dressing,  2x2 (in/in) (DME) (Generic) 1 x Per Day/30 Days ary Discharge Instructions: Apply to wound bed as instructed Secondary Dressing: Zetuvit Plus Silicone Border Dressing 4x4 (in/in) (DME) (Generic) 1 x Per Day/30 Days Discharge Instructions: Apply silicone border over primary dressing as directed. Patient Medications llergies: gloves, latex A Notifications Medication Indication Start End 09/22/2022 lidocaine DOSE topical 4 % cream - cream topical Electronic Signature(s) Signed: 09/22/2022 11:24:38 AM By: Duanne Guess MD FACS Signed: 09/22/2022 3:50:53 PM By: Samuella Bruin Previous Signature: 09/22/2022 10:46:53 AM Version By: Duanne Guess MD FACS Entered By: Samuella Bruin on 09/22/2022 11:14:39 Hattabaugh, Karalyn Black ([id number]) [id number] Page 9 of 15 -------------------------------------------------------------------------------- Problem List Details Patient Name: Date of Service: DO [name], [place]. 09/22/2022 10:00 A M Medical Record Number: [id number] Patient Account Number: [id number] Date of Birth/Sex: Treating RN: 2000/05/11 (22 y.o. 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 09/22/2022 10:00 A M Medical Record Number: [id number] Patient Account Number: [id number] Date of Birth/Sex: Treating RN: Aug 04, 2000 (22 y.o. F) Primary Care Provider: Enid Skeens Other Clinician: Referring Provider: Treating Provider/Extender: Arther Dames in Treatment: [address] Tonisha Black ([id number]) [id number] Page 10 of 15 Subjective Chief Complaint Information obtained from Patient Patient seen for complaints of Non-Healing Wound. History of Present Illness (HPI) ADMISSION 05/23/2022 This is a 22 year old young woman with Prader-Willi syndrome and the usual accompanying super morbid obesity, diabetes, and skin picking disorder. She picked a large wound in her right thigh that resulted in an emergency department visit on 17 February. Apparently her father was cleaning the wound with mupirocin and hydrogen peroxide. A culture was taken that grew out Proteus and methicillin sensitive Staph aureus. She was prescribed levofloxacin. She saw her primary care provider a few days later and based on the depth and appearance of the wound, she was referred to the wound care  center for further evaluation and management. There is a large area on the patient's upper right thigh that is excoriated and scaly with dry skin that has obviously been picked at. The primary wound is 4-1/2 cm in depth. It is much cleaner than described in the ED notes. There is also a linear ulcer adjacent to this deep site with slough accumulation. 3/15; patient presents for follow-up. She has been using iodoform packing followed by silver alginate to the wound bed. Mother is present during the encounter. There is been improvement in wound healing. 06/23/2022: The depth of the wound has come in further. The periwound skin is in improved condition with very few open areas. 07/21/2022: The wound continues to contract. Her periwound skin has completely healed. 08/18/2022: The wound measured a couple of  millimeters deeper today. No concern for infection. 09/22/2022: Unfortunately, over the past month, [name] has engaged in significant skin picking activity. She has wounds on each of her wrists, 1 on her right dorsal hand, 1 on her left shoulder, and 1 on her left anterior tibial surface. The initial wound on her right thigh is a little bit shallower. No concern for infection at any of the sites. Patient History Information obtained from Caregiver. Family History Hypertension - Father, Thyroid Problems - Father, No family history of Cancer, Diabetes, Heart Disease, Hereditary Spherocytosis, Kidney Disease, Lung Disease, Seizures, Stroke, Tuberculosis. Social History Never smoker, Marital Status - Single, Alcohol Use - Never, Drug Use - No History, Caffeine Use - Never. Medical History Endocrine Patient has history of Type II Diabetes Hospitalization/Surgery History - knee surgery. - myringotomy. 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 no acute distress. Vitals Time Taken: 10:02 AM, Height: 60 in, Weight: 380 lbs, BMI: 74.2, Temperature: 97.6 F, Pulse: 97 bpm, Respiratory Rate: 16 breaths/min, Blood Pressure: 163/89 mmHg. Respiratory Normal work of breathing on room air. General Notes: 09/22/2022: Unfortunately, over the past month, [name] has engaged in significant skin picking activity. She has wounds on each of her wrists, 1 on her right dorsal hand, 1 on her left shoulder, and 1 on her left anterior tibial surface. There is slough and eschar accumulation at all of these sites. The initial wound on her right thigh is a little bit shallower. No concern for infection at any of the sites. Integumentary (Hair, Skin) Wound #1 status is Open. Original cause of wound was Trauma. The date acquired was: 04/07/2022. The wound has been in treatment 17 weeks. The wound is located on the Right,Lateral Upper Leg. The wound measures 0.4cm length x 0.4cm width x 2.8cm depth; 0.126cm^2 area and 0.352cm^3 volume. There is Fat Layer (Subcutaneous Tissue) exposed. There is no tunneling or undermining noted. There is a medium amount of serosanguineous drainage noted. The wound margin is distinct with the outline attached to the wound base. There is large (67-100%) red, pink granulation within the wound bed. There is no necrotic tissue [name], [name] ([id number]) [id number] Page 11 of 15 within the wound bed. The periwound skin appearance had no abnormalities noted for color. The periwound skin appearance exhibited: Scarring, Dry/Scaly. Periwound temperature was noted as No Abnormality. Wound #2 status is Open. Original cause of wound was Skin T ear/Laceration. The date acquired was: 09/08/2022. The wound is located on the Right Hand - Dorsum. The wound measures 2cm length x 1.7cm width x 0.1cm depth; 2.67cm^2 area and 0.267cm^3 volume. There is Fat Layer  (Subcutaneous Tissue) exposed. There is no tunneling or undermining noted. There is a medium amount of serous drainage noted. The wound margin is distinct with the outline attached to the wound base. There is small (1-33%) red granulation within the wound bed. There is a large (67-100%) amount of necrotic tissue within the wound bed including Adherent Slough. The periwound skin appearance had no abnormalities noted for texture. The periwound skin appearance had no abnormalities noted for moisture. The periwound skin appearance had no abnormalities noted for color. Periwound temperature was noted as No Abnormality. Wound #3 status is Open. Original cause of wound was Skin T ear/Laceration. The date acquired was: 09/08/2022. The wound is located on the Left Hand - Dorsum. The wound measures 1.5cm length x 0.6cm width x 0.1cm  depth; 0.707cm^2 area and 0.071cm^3 volume. There is Fat Layer (Subcutaneous Tissue) exposed. There is no tunneling or undermining noted. There is a medium amount of serous drainage noted. The wound margin is distinct with the outline attached to the wound base. There is large (67-100%) red, pink granulation within the wound bed. There is a small (1-33%) amount of necrotic tissue within the wound bed including Adherent Slough. The periwound skin appearance had no abnormalities noted for texture. The periwound skin appearance had no abnormalities noted for moisture. The periwound skin appearance had no abnormalities noted for color. Periwound temperature was noted as No Abnormality. Wound #4 status is Open. Original cause of wound was Skin T ear/Laceration. The date acquired was: 09/08/2022. The wound is located on the Left Forearm. The wound measures 1.5cm length x 0.7cm width x 0.1cm depth; 0.825cm^2 area and 0.082cm^3 volume. There is no tunneling or undermining noted. There is a medium amount of serous drainage noted. The wound margin is distinct with the outline attached to the  wound base. There is large (67-100%) red granulation within the wound bed. There is a small (1-33%) amount of necrotic tissue within the wound bed including Eschar. The periwound skin appearance had no abnormalities noted for texture. The periwound skin appearance had no abnormalities noted for moisture. The periwound skin appearance had no abnormalities noted for color. Periwound temperature was noted as No Abnormality. Wound #5 status is Open. Original cause of wound was Skin T ear/Laceration. The date acquired was: 09/08/2022. The wound is located on the Left Shoulder. The wound measures 1.5cm length x 6.5cm width x 0.1cm depth; 7.658cm^2 area and 0.766cm^3 volume. There is Fat Layer (Subcutaneous Tissue) exposed. There is no tunneling or undermining noted. There is a medium amount of serous drainage noted. The wound margin is distinct with the outline attached to the wound base. There is large (67-100%) red granulation within the wound bed. There is a small (1-33%) amount of necrotic tissue within the wound bed including Eschar and Adherent Slough. The periwound skin appearance had no abnormalities noted for texture. The periwound skin appearance had no abnormalities noted for color. The periwound skin appearance exhibited: Dry/Scaly. Periwound temperature was noted as No Abnormality. Wound #6 status is Open. Original cause of wound was Skin T ear/Laceration. The date acquired was: 09/08/2022. The wound is located on the Left,Anterior Lower Leg. The wound measures 0.5cm length x 0.7cm width x 0.1cm depth; 0.275cm^2 area and 0.027cm^3 volume. There is Fat Layer (Subcutaneous Tissue) exposed. There is no tunneling or undermining noted. There is a medium amount of serous drainage noted. The wound margin is distinct with the outline attached to the wound base. There is large (67-100%) red granulation within the wound bed. There is a small (1-33%) amount of necrotic tissue within the wound bed including  Adherent Slough. The periwound skin appearance had no abnormalities noted for moisture. The periwound skin appearance had no abnormalities noted for color. The periwound skin appearance exhibited: Scarring. Periwound temperature was noted as No Abnormality. 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 There was a Selective/Open  Wound Non-Viable Tissue Debridement with a total area of 0.13 sq cm performed by Duanne Guess, MD. With the following instrument(s): Curette to remove Non-Viable tissue/material. Material removed includes [facility] after achieving pain control using Lidocaine 4% Topical Solution. No specimens were taken. A time out was conducted at 10:29, prior to the start of the procedure. A Minimum amount of bleeding was controlled with Pressure. The procedure was tolerated well. Post Debridement Measurements: 0.4cm length x 0.4cm width x 2.8cm depth; 0.352cm^3 volume. Character of Wound/Ulcer Post Debridement is improved. Post procedure Diagnosis Wound #1: Same as Pre-Procedure General Notes: scribed for Dr. Lady Gary by Samuella Bruin, RN. Wound #2 Pre-procedure diagnosis of Wound #2 is an Abrasion located on the Right Hand - Dorsum . There was a Selective/Open Wound Non-Viable Tissue Debridement with a total area of 2.67 sq cm performed by Duanne Guess, MD. With the following instrument(s): Curette to remove Non-Viable tissue/material. Material removed includes Eschar and Slough and after achieving pain control  using Lidocaine 4% T opical Solution. No specimens were taken. A time out was conducted at 10:29, prior to the start of the procedure. A Minimum amount of bleeding was controlled with Pressure. The procedure was tolerated well. Post Debridement Measurements: 2cm length x 1.7cm width x 0.1cm depth; 0.267cm^3 volume. Character of Wound/Ulcer Post Debridement is improved. Post procedure Diagnosis Wound #2: Same as Pre-Procedure General Notes: scribed for Dr. Lady Gary by Samuella Bruin, RN. Wound #3 Pre-procedure diagnosis of Wound #3 is an Abrasion located on the Left Hand - Dorsum . There was a Selective/Open Wound Non-Viable Tissue Debridement with a total area of 0.71 sq cm performed by Duanne Guess, MD. With the following instrument(s): Curette to remove Non-Viable tissue/material. Material removed includes Eschar and Slough and after achieving pain control using Lidocaine 4% T opical Solution. No specimens were taken. A time out was conducted at 10:29, prior to the start of the procedure. A Minimum amount of bleeding was controlled with Pressure. The procedure was tolerated well. Post Debridement Measurements: 1.5cm length x 0.6cm width x 0.1cm depth; 0.071cm^3 volume. Character of Wound/Ulcer Post Debridement is improved. Post procedure Diagnosis Wound #3: Same as Pre-Procedure General Notes: scribed for Dr. Lady Gary by Samuella Bruin, RN. [name], [name] ([id number]) [id number] Page 12 of 15 Wound #4 Pre-procedure diagnosis of Wound #4 is an Abrasion located on the Left Forearm . There was a Selective/Open Wound Non-Viable Tissue Debridement with a total area of 0.82 sq cm performed by Duanne Guess, MD. With the following instrument(s): Curette to remove Non-Viable tissue/material. Material removed includes Eschar and Slough and after achieving pain control using Lidocaine 4% Topical Solution. No specimens were taken. A time out was conducted at 10:29,  prior to the start of the procedure. A Minimum amount of bleeding was controlled with Pressure. The procedure was tolerated well. Post Debridement Measurements: 1.5cm length x 0.7cm width x 0.1cm depth; 0.082cm^3 volume. Character of Wound/Ulcer Post Debridement is improved. Post procedure Diagnosis Wound #4: Same as Pre-Procedure General Notes: scribed for Dr. Lady Gary by Samuella Bruin, RN. Wound #5 Pre-procedure diagnosis of Wound #5 is an Abrasion located on the Left Shoulder . There was a Selective/Open Wound Non-Viable Tissue Debridement with a total area of 2.3 sq cm performed by Duanne Guess, MD. With the following instrument(s): Curette to remove Non-Viable tissue/material. Material removed includes Eschar and Slough and after achieving pain control using Lidocaine 4% Topical Solution. No specimens were taken. A time out was conducted at 10:29, prior to the start of  the procedure. A Minimum amount of bleeding was controlled with Pressure. The procedure was tolerated well. Post Debridement Measurements: 1.5cm length x 6.5cm width x 0.1cm depth; 0.766cm^3 volume. Character of Wound/Ulcer Post Debridement is improved. Post procedure Diagnosis Wound #5: Same as Pre-Procedure General Notes: scribed for Dr. Lady Gary by Samuella Bruin, RN. Wound #6 Pre-procedure diagnosis of Wound #6 is an Abrasion located on the Left,Anterior Lower Leg . There was a Selective/Open Wound Non-Viable Tissue Debridement with a total area of 0.27 sq cm performed by Duanne Guess, MD. With the following instrument(s): Curette to remove Non-Viable tissue/material. Material removed includes Eschar and Slough and after achieving pain control using Lidocaine 4% Topical Solution. No specimens were taken. A time out was conducted at 10:29, prior to the start of the procedure. A Minimum amount of bleeding was controlled with Pressure. The procedure was tolerated well. Post Debridement Measurements: 0.5cm length x  0.7cm width x 0.1cm depth; 0.027cm^3 volume. Character of Wound/Ulcer Post Debridement is improved. Post procedure Diagnosis Wound #6: Same as Pre-Procedure General Notes: scribed for Dr. Lady Gary by Samuella Bruin, RN. Plan Follow-up Appointments: Return appointment in 1 month. - Dr. Lady Gary - room 2 Anesthetic: (In clinic) Topical Lidocaine 4% applied to wound bed Bathing/ Shower/ Hygiene: May shower and wash wound with soap and water. The following medication(s) was prescribed: lidocaine topical 4 % cream cream topical was prescribed at facility WOUND #1: - Upper Leg Wound Laterality: Right, Lateral Cleanser: Normal Saline (DME) (Generic) 1 x Per Day/30 Days Discharge Instructions: Cleanse the wound with Normal Saline prior to applying a clean dressing using gauze sponges, not tissue or cotton balls. Cleanser: Soap and Water 1 x Per Day/30 Days Discharge Instructions: May shower and wash wound with dial antibacterial soap and water prior to dressing change. Cleanser: Wound Cleanser (Generic) 1 x Per Day/30 Days Discharge Instructions: Cleanse the wound with wound cleanser prior to applying a clean dressing using gauze sponges, not tissue or cotton balls. Peri-Wound Care: Skin Prep (Generic) 1 x Per Day/30 Days Discharge Instructions: Use skin prep as directed Peri-Wound Care: Sween Lotion (Moisturizing lotion) 1 x Per Day/30 Days Discharge Instructions: Apply moisturizing lotion as directed Prim Dressing: Hydrofera Blue Classic Foam Rope Dressing, 9x6 (mm/in) (Generic) 1 x Per Day/30 Days ary Discharge Instructions: Moisten with saline prior to packing Secondary Dressing: Woven Gauze Sponge, Non-Sterile 4x4 in (DME) (Generic) 1 x Per Day/30 Days Discharge Instructions: Apply over primary dressing as directed. Secondary Dressing: Zetuvit Plus Silicone Border Dressing 4x4 (in/in) (DME) (Generic) 1 x Per Day/30 Days Discharge Instructions: Apply silicone border over primary dressing as  directed. Secured With: Paper T ape, 2x10 (in/yd) (DME) (Generic) 1 x Per Day/30 Days Discharge Instructions: Secure dressing with tape as directed. WOUND #2: - Hand - Dorsum Wound Laterality: Right Cleanser: Soap and Water 1 x Per Day/30 Days Discharge Instructions: May shower and wash wound with dial antibacterial soap and water prior to dressing change. Cleanser: Wound Cleanser 1 x Per Day/30 Days Discharge Instructions: Cleanse the wound with wound cleanser prior to applying a clean dressing using gauze sponges, not tissue or cotton balls. Topical: Mupirocin Ointment 1 x Per Day/30 Days Discharge Instructions: Apply Mupirocin (Bactroban) as instructed Prim Dressing: Maxorb Extra Ag+ Alginate Dressing, 2x2 (in/in) (DME) (Generic) 1 x Per Day/30 Days ary Discharge Instructions: Apply to wound bed as instructed Secondary Dressing: Zetuvit Plus Silicone Border Dressing 4x4 (in/in) (DME) (Generic) 1 x Per Day/30 Days Discharge Instructions: Apply silicone border over primary dressing as  directed. WOUND #3: - Hand - Dorsum Wound Laterality: Left Cleanser: Soap and Water 1 x Per Day/30 Days Discharge Instructions: May shower and wash wound with dial antibacterial soap and water prior to dressing change. Cleanser: Wound Cleanser 1 x Per Day/30 Days Discharge Instructions: Cleanse the wound with wound cleanser prior to applying a clean dressing using gauze sponges, not tissue or cotton balls. Topical: Mupirocin Ointment 1 x Per Day/30 Days Discharge Instructions: Apply Mupirocin (Bactroban) as instructed Prim Dressing: Maxorb Extra Ag+ Alginate Dressing, 2x2 (in/in) (DME) (Generic) 1 x Per Day/30 Days ary Discharge Instructions: Apply to wound bed as instructed Secondary Dressing: Zetuvit Plus Silicone Border Dressing 4x4 (in/in) (DME) (Generic) 1 x Per Day/30 Days Discharge Instructions: Apply silicone border over primary dressing as directed. WOUND #4: - Forearm Wound Laterality:  Left Cleanser: Soap and Water 1 x Per Day/30 Days Discharge Instructions: May shower and wash wound with dial antibacterial soap and water prior to dressing change. Cleanser: Wound Cleanser 1 x Per Day/30 Days Discharge Instructions: Cleanse the wound with wound cleanser prior to applying a clean dressing using gauze sponges, not tissue or cotton balls. [name], [name] ([id number]) [id number] Page 13 of 15 Topical: Mupirocin Ointment 1 x Per Day/30 Days Discharge Instructions: Apply Mupirocin (Bactroban) as instructed Prim Dressing: Maxorb Extra Ag+ Alginate Dressing, 2x2 (in/in) (DME) (Generic) 1 x Per Day/30 Days ary Discharge Instructions: Apply to wound bed as instructed Secondary Dressing: Zetuvit Plus Silicone Border Dressing 4x4 (in/in) (DME) (Generic) 1 x Per Day/30 Days Discharge Instructions: Apply silicone border over primary dressing as directed. WOUND #5: - Shoulder Wound Laterality: Left Cleanser: Soap and Water 1 x Per Day/30 Days Discharge Instructions: May shower and wash wound with dial antibacterial soap and water prior to dressing change. Cleanser: Wound Cleanser 1 x Per Day/30 Days Discharge Instructions: Cleanse the wound with wound cleanser prior to applying a clean dressing using gauze sponges, not tissue or cotton balls. Topical: Mupirocin Ointment 1 x Per Day/30 Days Discharge Instructions: Apply Mupirocin (Bactroban) as instructed Prim Dressing: Maxorb Extra Ag+ Alginate Dressing, 2x2 (in/in) (DME) (Generic) 1 x Per Day/30 Days ary Discharge Instructions: Apply to wound bed as instructed Secondary Dressing: Zetuvit Plus Silicone Border Dressing 5x5 (in/in) (DME) (Generic) 1 x Per Day/30 Days Discharge Instructions: Apply silicone border over primary dressing as directed. WOUND #6: - Lower Leg Wound Laterality: Left, Anterior Cleanser: Soap and Water 1 x Per Day/30 Days Discharge Instructions: May shower and wash wound with dial  antibacterial soap and water prior to dressing change. Cleanser: Wound Cleanser 1 x Per Day/30 Days Discharge Instructions: Cleanse the wound with wound cleanser prior to applying a clean dressing using gauze sponges, not tissue or cotton balls. Topical: Mupirocin Ointment 1 x Per Day/30 Days Discharge Instructions: Apply Mupirocin (Bactroban) as instructed Prim Dressing: Maxorb Extra Ag+ Alginate Dressing, 2x2 (in/in) (DME) (Generic) 1 x Per Day/30 Days ary Discharge Instructions: Apply to wound bed as instructed Secondary Dressing: Zetuvit Plus Silicone Border Dressing 4x4 (in/in) (DME) (Generic) 1 x Per Day/30 Days Discharge Instructions: Apply silicone border over primary dressing as directed. 09/22/2022: Unfortunately, over the past month, [name] has engaged in significant skin picking activity. She has wounds on each of her wrists, 1 on her right dorsal hand, 1 on her left shoulder, and 1 on her left anterior tibial surface. The initial wound on her right thigh is a little bit shallower. No concern for infection at any of the sites. I used a curette to debride  slough and eschar from all of her new wounds. We will apply topical mupirocin with silver alginate and foam border dressings to all of these locations. I debrided slough out of the tract on her right thigh. We will continue to pack this location with Hydrofera Blue rope. Follow-up in 1 month. Electronic Signature(s) Signed: 09/25/2022 2:17:18 PM By: Pearletha Alfred Signed: 09/25/2022 2:55:45 PM By: Duanne Guess MD FACS Previous Signature: 09/22/2022 11:24:38 AM Version By: Duanne Guess MD FACS Previous Signature: 09/22/2022 3:50:53 PM Version By: Samuella Bruin Previous Signature: 09/22/2022 10:43:32 AM Version By: Duanne Guess MD FACS Entered By: Pearletha Alfred on 09/25/2022 14:17:18 -------------------------------------------------------------------------------- HxROS Details Patient Name: Date of Service: [name], MA DISO  N Black. 09/22/2022 10:00 A M Medical Record Number: [id number] Patient Account Number: [id number] Date of Birth/Sex: Treating RN: 08/07/2000 (22 y.o. F) Primary Care Provider: Enid Skeens Other Clinician: Referring Provider: Treating Provider/Extender: Arther Dames in Treatment: 17 Information Obtained From Caregiver Constitutional Symptoms (General Health) Medical History: Past Medical History Notes: obese Endocrine Medical History: Positive for: Type II Diabetes Time with diabetes: 3 yrs Treated with: Insulin Blood sugar tested every day: Yes Tested LAKEIDRA, [name] ([id number]) [id number] Page 14 of 15 Musculoskeletal Medical History: Past Medical History Notes: scoliosis Neurologic Medical History: Past Medical History Notes: Prader-willi syndrome Psychiatric Medical History: Past Medical History Notes: compulsive skin picking Immunizations Pneumococcal Vaccine: Received Pneumococcal Vaccination: No Implantable Devices None Hospitalization / Surgery History Type of Hospitalization/Surgery knee surgery myringotomy Family and Social History Cancer: No; Diabetes: No; Heart Disease: No; Hereditary Spherocytosis: No; Hypertension: Yes - Father; Kidney Disease: No; Lung Disease: No; Seizures: No; Stroke: No; Thyroid Problems: Yes - Father; Tuberculosis: No; Never smoker; Marital Status - Single; Alcohol Use: Never; Drug Use: No History; Caffeine Use: Never; Financial Concerns: No; Food, Clothing or Shelter Needs: No; Support System Lacking: No; Transportation Concerns: No Electronic Signature(s) Signed: 09/22/2022 10:46:53 AM By: Duanne Guess MD FACS Entered By: Duanne Guess on 09/22/2022 10:41:42 -------------------------------------------------------------------------------- SuperBill Details Patient Name: Date of Service: [name], MA [name]. 09/22/2022 Medical Record Number: [id number] Patient Account  Number: [id number] Date of Birth/Sex: Treating RN: [birth year] (22 y.o. 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 Prader-Willi syndrome Z68.45 Body mass index [BMI] 70 or greater, adult E11.65 Type 2 diabetes mellitus with hyperglycemia Facility Procedures : REGENNA, TEDRICK Code: [id number] [name] [id number] ICD- Description: 97597 - DEBRIDE WOUND 1ST 20 SQ CM OR < 2992) [phone] 10 Diagnosis Description L97.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 L98.492 Non-pressure chronic ulcer of skin of other sites with fat layer  exposed L97.822 Non-pressure chronic ulcer of other part of left lower leg with fat layer exposed Modifier: [id number] Quantity: 1 27.pdf Page 15 of 15 Physician Procedures : CPT4 Code Description Modifier [phone] 99214 - WC PHYS LEVEL 4 - EST PT 25 ICD-10 Diagnosis Description L97.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 L98.492 Non-pressure chronic ulcer of skin of other sites with fat layer  exposed L97.822 Non-pressure chronic ulcer of other part of left lower leg with fat  layer exposed 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

## 2022-09-25 NOTE — Progress Notes (Signed)
Rebekah, Black ([id number]) [id number] Page 1 of 7 Visit Report for 08/18/2022 Chief Complaint Document Details Patient Name: Date of Service: DO [name], [place] 08/18/2022 10:00 A M Medical Record Number: [id number] Patient Account Number: [id number] Date of Birth/Sex: Treating RN: [birth year] (21 y.o. F) Primary Care Provider: Enid Skeens Other Clinician: Referring Provider: Treating Provider/Extender: Arther Dames in Treatment: 12 Information Obtained from: Patient Chief Complaint Patient seen for complaints of Non-Healing Wound. Electronic Signature(s) Signed: 08/18/2022 10:27:06 AM By: Duanne Guess MD FACS Entered By: Duanne Guess on 08/18/2022 10:27:06 -------------------------------------------------------------------------------- HPI Details Patient Name: Date of Service: [name], [name] 08/18/2022 10:00 A M Medical Record Number: [id number] Patient Account Number: [id number] Date of Birth/Sex: Treating RN: 10-Jul-2000 (21 y.o. F) Primary Care Provider: Enid Skeens Other Clinician: Referring Provider: Treating Provider/Extender: Arther Dames in Treatment: 12 History of Present Illness HPI Description: ADMISSION 05/23/2022 This is a 22 year old young woman with Prader-Willi syndrome and the usual accompanying super morbid obesity, diabetes, and skin picking disorder. She picked a large wound in her right thigh that resulted in an emergency department visit on 17 February. Apparently her father was cleaning the wound with mupirocin and hydrogen peroxide. A culture was taken that grew out Proteus and methicillin sensitive Staph aureus. She was prescribed levofloxacin. She saw her primary care provider a few days later and based on the depth and appearance of the wound, she was referred to the wound care center for further evaluation and management. There is a large area on the patient's  upper right thigh that is excoriated and scaly with dry skin that has obviously been picked at. The primary wound is 4-1/2 cm in depth. It is much cleaner than described in the ED notes. There is also a linear ulcer adjacent to this deep site with slough accumulation. 3/15; patient presents for follow-up. She has been using iodoform packing followed by silver alginate to the wound bed. Mother is present during the encounter. There is been improvement in wound healing. 06/23/2022: The depth of the wound has come in further. The periwound skin is in improved condition with very few open areas. 07/21/2022: The wound continues to contract. Her periwound skin has completely healed. 08/18/2022: The wound measured a couple of millimeters deeper today. No concern for infection. Electronic Signature(s) Signed: 08/18/2022 10:27:45 AM By: Duanne Guess MD FACS Entered By: Duanne Guess on 08/18/2022 10:27:44 Black, Rebekah R ([id number]) [id number] Page 2 of 7 -------------------------------------------------------------------------------- Physical Exam Details Patient Name: Date of Service: DO [name], [place] R. 08/18/2022 10:00 A M Medical Record Number: [id number] Patient Account Number: [id number] Date of Birth/Sex: Treating RN: Apr 13, 2000 (21 y.o. F) Primary Care Provider: Enid Skeens Other Clinician: Referring Provider: Treating Provider/Extender: Arther Dames in Treatment: 12 Constitutional Hypertensive, asymptomatic. Slightly tachycardic. . . no acute distress. Respiratory Normal work of breathing on room air. Notes 08/18/2022: The wound measured a couple of millimeters deeper today. No concern for infection. Electronic Signature(s) Signed: 08/18/2022 10:28:21 AM By: Duanne Guess MD FACS Entered By: Duanne Guess on 08/18/2022 10:28:21 -------------------------------------------------------------------------------- Physician  Orders Details Patient Name: Date of Service: [name], [name] 08/18/2022 10:00 A M Medical Record Number: [id number] Patient Account Number: [id number] Date of Birth/Sex: Treating RN: 06/07/2000 (21 y.o. 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 - Dr. Lady Gary - room 2 Bathing/ Shower/ Hygiene May shower and wash wound with soap and water. Wound Treatment Wound #1 - Upper Leg Wound Laterality: Right, Lateral Cleanser: Soap and Water 1 x Per Day/30 Days Discharge Instructions: May shower and wash wound with dial antibacterial soap and water prior to dressing change. Cleanser: Wound Cleanser (Generic) 1 x Per Day/30 Days Discharge Instructions: Cleanse the wound with wound cleanser prior to applying a clean dressing using gauze sponges, not tissue or cotton balls. Peri-Wound Care: Skin Prep (Generic) 1 x Per Day/30 Days Discharge Instructions: Use skin prep as directed Rebekah, Black ([id number]) [id number] Page 3 of 7 Peri-Wound Care: Sween Lotion (Moisturizing lotion) 1 x Per Day/30 Days Discharge Instructions: Apply moisturizing lotion as directed Prim Dressing: Hydrofera Blue Classic Foam Rope Dressing, 9x6 (mm/in) (DME) (Generic) 1 x Per Day/30 Days ary Discharge Instructions: Moisten with saline prior to packing Secondary Dressing: ABD Pad, 5x9 (Generic) 1 x Per Day/30 Days Discharge Instructions: Apply over primary dressing as  directed. Secondary Dressing: Woven Gauze Sponge, Non-Sterile 4x4 in (DME) (Generic) 1 x Per Day/30 Days Discharge Instructions: Apply over primary dressing as directed. Secured With: 31M Medipore H Soft Cloth Surgical T ape, 4 x 10 (in/yd) (DME) (Generic) 1 x Per Day/30 Days Discharge Instructions: Secure with tape as directed. Electronic Signature(s) Signed: 08/18/2022 10:28:34 AM By: Duanne Guess MD FACS Entered By: Duanne Guess on 08/18/2022 10:28:33 -------------------------------------------------------------------------------- Problem List Details Patient Name: Date of Service: [name], [name] 08/18/2022 10:00 A M Medical Record Number: [id number] Patient Account Number: [id number] Date of Birth/Sex: Treating RN: Jul 06, 2000 (21 y.o. 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 05/23/2022 No Yes Q87.11 Prader-Willi syndrome 05/23/2022 No Yes Z68.45 Body mass index [BMI] 70 or greater, adult 05/23/2022 No Yes E11.65 Type 2 diabetes mellitus with hyperglycemia 05/23/2022 No Yes Inactive Problems Resolved Problems Electronic Signature(s) Signed: 08/18/2022 10:25:59 AM By: Duanne Guess MD FACS Entered By: Duanne Guess on 08/18/2022 10:25:59 Black, Rebekah R ([id number]) [id number] Page 4 of 7 -------------------------------------------------------------------------------- Progress Note Details Patient Name: Date of Service: DO [name], [place] R. 08/18/2022 10:00 A M Medical Record Number: [id number] Patient Account Number: [id number] Date of Birth/Sex: Treating RN: Feb 22, 2001 (21 y.o. F) Primary Care Provider:  Enid Skeens Other Clinician: Referring Provider: Treating Provider/Extender: Arther Dames in Treatment: 12 Subjective Chief Complaint Information obtained from Patient Patient seen for complaints of Non-Healing Wound. History of Present Illness (HPI) ADMISSION 05/23/2022 This is a 22 year old young woman with Prader-Willi syndrome and the usual accompanying super morbid obesity, diabetes, and skin picking disorder. She picked a large wound in her right thigh that resulted in an emergency department visit on 17 February. Apparently her father was cleaning the wound with mupirocin and hydrogen peroxide. A culture was taken that grew out Proteus and methicillin sensitive Staph aureus. She was prescribed levofloxacin. She saw her primary care provider a few days later and based on the  depth and appearance of the wound, she was referred to the wound care center for further evaluation and management. There is a large area on the patient's upper right thigh that is excoriated and scaly with dry skin that has obviously been picked at. The primary wound is 4-1/2 cm in depth. It is much cleaner than described in the ED notes. There is also a linear ulcer adjacent to this deep site with slough accumulation. 3/15; patient presents for follow-up. She has been using iodoform packing followed by silver alginate to the wound bed. Mother is present during the encounter. There is been improvement in wound healing. 06/23/2022: The depth of the wound has come in further. The periwound skin is in improved condition with very few open areas. 07/21/2022: The wound continues to contract. Her periwound skin has completely healed. 08/18/2022: The wound measured a couple of millimeters deeper today. No concern for infection. Patient History Information obtained from Caregiver. Family History Hypertension - Father, Thyroid Problems - Father, No family history of Cancer, Diabetes, Heart Disease,  Hereditary Spherocytosis, Kidney Disease, Lung Disease, Seizures, Stroke, Tuberculosis. Social History Never smoker, Marital Status - Single, Alcohol Use - Never, Drug Use - No History, Caffeine Use - Never. Medical History Endocrine Patient has history of Type II Diabetes Hospitalization/Surgery History - knee surgery. - myringotomy. Medical A Surgical History Notes nd Constitutional Symptoms (General Health) obese Musculoskeletal scoliosis Neurologic Prader-willi syndrome Psychiatric compulsive skin picking Objective Constitutional Hypertensive, asymptomatic. Slightly tachycardic. no acute distress. Vitals Time Taken: 10:10 AM, Height: 60 in, Weight: 380 lbs, BMI: 74.2, Temperature: 98.4 F, Pulse: 104 bpm, Respiratory Rate: 16 breaths/min, Blood Pressure: 157/85 mmHg. Black, Rebekah ([id number]) [id number] Page 5 of 7 Respiratory Normal work of breathing on room air. General Notes: 08/18/2022: The wound measured a couple of millimeters deeper today. No concern for infection. Integumentary (Hair, Skin) Wound #1 status is Open. Original cause of wound was Trauma. The date acquired was: 04/07/2022. The wound has been in treatment 12 weeks. The wound is located on the Right,Lateral Upper Leg. The wound measures 0.4cm length x 0.6cm width x 3.6cm depth; 0.188cm^2 area and 0.679cm^3 volume. There is Fat Layer (Subcutaneous Tissue) exposed. There is no tunneling or undermining noted. There is a medium amount of serosanguineous drainage noted. The wound margin is distinct with the outline attached to the wound base. There is large (67-100%) red, pink granulation within the wound bed. There is no necrotic tissue within the wound bed. The periwound skin appearance had no abnormalities noted for color. The periwound skin appearance exhibited: Scarring, Dry/Scaly. Periwound temperature was noted as No Abnormality. Assessment Active Problems ICD-10 Non-pressure  chronic ulcer of right thigh with fat layer exposed Excoriation (skin-picking) disorder Prader-Willi syndrome Body mass index [BMI] 70 or greater, adult Type 2 diabetes mellitus with hyperglycemia Plan Follow-up Appointments: Return appointment in 1 month. - Dr. Lady Gary - room 2 Bathing/ Shower/ Hygiene: May shower and wash wound with soap and water. WOUND #1: - Upper Leg Wound Laterality: Right, Lateral Cleanser: Soap and Water 1 x Per Day/30 Days Discharge Instructions: May shower and wash wound with dial antibacterial soap and water prior to dressing change. Cleanser: Wound Cleanser (Generic) 1 x Per Day/30 Days Discharge Instructions: Cleanse the wound with wound cleanser prior to applying a clean dressing using gauze sponges, not tissue or cotton balls. Peri-Wound Care: Skin Prep (Generic) 1 x Per Day/30 Days Discharge Instructions: Use skin prep as directed Peri-Wound Care: Sween Lotion (Moisturizing lotion) 1 x Per  Day/30 Days Discharge Instructions: Apply moisturizing lotion as directed Prim Dressing: Hydrofera Blue Classic Foam Rope Dressing, 9x6 (mm/in) (DME) (Generic) 1 x Per Day/30 Days ary Discharge Instructions: Moisten with saline prior to packing Secondary Dressing: ABD Pad, 5x9 (Generic) 1 x Per Day/30 Days Discharge Instructions: Apply over primary dressing as directed. Secondary Dressing: Woven Gauze Sponge, Non-Sterile 4x4 in (DME) (Generic) 1 x Per Day/30 Days Discharge Instructions: Apply over primary dressing as directed. Secured With: 44M Medipore H Soft Cloth Surgical T ape, 4 x 10 (in/yd) (DME) (Generic) 1 x Per Day/30 Days Discharge Instructions: Secure with tape as directed. 08/18/2022: The wound measured a couple of millimeters deeper today. No concern for infection. No debridement was required. It seems that the patient's father may be packing the wound a little too tightly, as he describes taking out at least twice in nearly 3 times as long a piece of  iodoform packing as the wound actually is deep. I am going to change the packing to [facility] rope, which should still fit in the wound but not permit the more aggressive placement of the dressing. Follow-up in 1 month. Electronic Signature(s) Signed: 09/25/2022 2:17:45 PM By: Pearletha Alfred Signed: 09/25/2022 2:55:18 PM By: Duanne Guess MD FACS Previous Signature: 08/18/2022 10:29:57 AM Version By: Duanne Guess MD FACS Entered By: Pearletha Alfred on 09/25/2022 14:17:44 Black, Rebekah R ([id number]) [id number] Page 6 of 7 -------------------------------------------------------------------------------- HxROS Details Patient Name: Date of Service: DO [name], [place] R. 08/18/2022 10:00 A M Medical Record Number: [id number] Patient Account Number: [id number] Date of Birth/Sex: Treating RN: 2000/04/01 (21 y.o. F) Primary Care Provider: Enid Skeens Other Clinician: Referring Provider: Treating Provider/Extender: Arther Dames in Treatment: 12 Information Obtained From Caregiver Constitutional Symptoms (General Health) Medical History: Past Medical History Notes: obese Endocrine Medical History: Positive for: Type II Diabetes Time with diabetes: 3 yrs Treated with: Insulin Blood sugar tested every day: Yes Tested : Musculoskeletal Medical History: Past Medical History Notes: scoliosis Neurologic Medical History: Past Medical History Notes: Prader-willi syndrome Psychiatric Medical History: Past Medical History Notes: compulsive skin picking Immunizations Pneumococcal Vaccine: Received Pneumococcal Vaccination: No Implantable Devices None Hospitalization / Surgery History Type of Hospitalization/Surgery knee surgery myringotomy Family and Social History Cancer: No; Diabetes: No; Heart Disease: No; Hereditary Spherocytosis: No; Hypertension: Yes - Father; Kidney Disease: No; Lung Disease: No; Seizures: No; Stroke:  No; Thyroid Problems: Yes - Father; Tuberculosis: No; Never smoker; Marital Status - Single; Alcohol Use: Never; Drug Use: No History; Caffeine Use: Never; Financial Concerns: No; Food, Clothing or Shelter Needs: No; Support System Lacking: No; Transportation Concerns: No Electronic Signature(s) Signed: 08/18/2022 10:37:33 AM By: Duanne Guess MD FACS Entered By: Duanne Guess on 08/18/2022 10:27:52 SuperBill Details -------------------------------------------------------------------------------- [name] ([id number]) [id number] Page 7 of 7 Patient Name: Date of Service: DO [name], [place] 08/18/2022 Medical Record Number: [id number] Patient Account Number: [id number] Date of Birth/Sex: Treating RN: Apr 30, 2000 (21 y.o. 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 Prader-Willi syndrome Z68.45 Body mass index [BMI] 70 or greater, adult E11.65 Type 2 diabetes mellitus with hyperglycemia Facility Procedures CPT4 Code Description Modifier Quantity [phone] - WOUND CARE VISIT-LEV 2 EST PT 1 Physician Procedures Quantity CPT4 Code Description Modifier [id number] - WC PHYS LEVEL 4 -  EST PT 1 ICD-10 Diagnosis Description L97.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 Q87.11 Prader-Willi syndrome 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

## 2022-10-01 ENCOUNTER — Other Ambulatory Visit: Payer: Self-pay | Admitting: Nurse Practitioner

## 2022-10-01 DIAGNOSIS — R030 Elevated blood-pressure reading, without diagnosis of hypertension: Secondary | ICD-10-CM

## 2022-10-01 DIAGNOSIS — E1165 Type 2 diabetes mellitus with hyperglycemia: Secondary | ICD-10-CM

## 2022-10-01 DIAGNOSIS — Q8711 Prader-Willi syndrome: Secondary | ICD-10-CM

## 2022-10-01 DIAGNOSIS — F424 Excoriation (skin-picking) disorder: Secondary | ICD-10-CM

## 2022-10-01 DIAGNOSIS — Z6841 Body Mass Index (BMI) 40.0 and over, adult: Secondary | ICD-10-CM

## 2022-10-01 DIAGNOSIS — M4100 Infantile idiopathic scoliosis, site unspecified: Secondary | ICD-10-CM

## 2022-10-05 ENCOUNTER — Ambulatory Visit: Payer: 59 | Admitting: Nurse Practitioner

## 2022-10-06 ENCOUNTER — Encounter: Payer: Self-pay | Admitting: Nurse Practitioner

## 2022-10-20 ENCOUNTER — Encounter (HOSPITAL_BASED_OUTPATIENT_CLINIC_OR_DEPARTMENT_OTHER): Payer: 59 | Attending: General Surgery | Admitting: General Surgery

## 2022-10-20 DIAGNOSIS — E1165 Type 2 diabetes mellitus with hyperglycemia: Secondary | ICD-10-CM | POA: Insufficient documentation

## 2022-10-20 DIAGNOSIS — Z6841 Body Mass Index (BMI) 40.0 and over, adult: Secondary | ICD-10-CM | POA: Insufficient documentation

## 2022-10-20 DIAGNOSIS — L97822 Non-pressure chronic ulcer of other part of left lower leg with fat layer exposed: Secondary | ICD-10-CM | POA: Diagnosis not present

## 2022-10-20 DIAGNOSIS — F424 Excoriation (skin-picking) disorder: Secondary | ICD-10-CM | POA: Insufficient documentation

## 2022-10-20 DIAGNOSIS — L98492 Non-pressure chronic ulcer of skin 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: Secondary | ICD-10-CM | POA: Insufficient documentation

## 2022-10-20 DIAGNOSIS — Q8711 Prader-Willi syndrome: Secondary | ICD-10-CM | POA: Diagnosis not present

## 2022-10-20 DIAGNOSIS — L97812 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osed: Secondary | ICD-10-CM | POA: Insufficient documentation

## 2022-10-20 NOTE — Progress Notes (Signed)
[name], [name] ([id number]) [id number] Page 1 of 13 Visit Report for 10/20/2022 Chief Complaint Document Details Patient Name: Date of Service: DO [place], [place] 10/20/2022 10:30 A M Medical Record Number: [id number] Patient Account Number: [id number] Date of Birth/Sex: Treating RN: 02/06/2001 (22 y.o. F) Primary Care Provider: Enid Skeens Other Clinician: Referring Provider: Treating Provider/Extender: Arther Dames in Treatment: 21 Information Obtained from: Patient Chief Complaint Patient seen for complaints of Non-Healing Wound. Electronic Signature(s) Signed: 10/20/2022 11:08:46 AM By: Duanne Guess MD FACS Entered By: Duanne Guess on 10/20/2022 11:08:46 -------------------------------------------------------------------------------- Debridement Details Patient Name: Date of Service: [name], MA [name]. 10/20/2022 10:30 A M Medical Record Number: [id number] Patient Account Number: [id number] Date of Birth/Sex: Treating RN: [birth year] (22 y.o. [name] Primary Care Provider: Enid Skeens Other Clinician: Referring Provider: Treating Provider/Extender: Arther Dames in Treatment: 21 Debridement Performed for Assessment: Wound #6 Left,Anterior Lower Leg Performed By: Physician Duanne Guess, MD Debridement Type: Debridement Level of Consciousness (Pre-procedure): Awake and Alert Pre-procedure Verification/Time Out Yes - 11:00 Taken: Start Time: 11:00 Pain Control: Lidocaine 4% Topical Solution Percent of Wound Bed Debrided: 100% T Area Debrided (cm): otal 0.06 Tissue and other material debrided: Non-Viable, Eschar, Slough, Slough Level: Non-Viable Tissue Debridement Description: Selective/Open Wound Instrument: Curette Bleeding: Minimum Hemostasis Achieved: Pressure Response to Treatment: Procedure was tolerated well Level of Consciousness (Post- Awake and  Alert procedure): Post Debridement Measurements of Total Wound Length: (cm) 0.4 Width: (cm) 0.2 Depth: (cm) 0.1 Volume: (cm) 0.006 Character of Wound/Ulcer Post Debridement: Improved Post Procedure Diagnosis Same as Dauna Baumgartner, Charlina Black ([id number]) [id number] Page 2 of 13 Notes scribed for Dr. Lady Gary by Samuella Bruin, RN Electronic Signature(s) Signed: 10/20/2022 11:40:21 AM By: Samuella Bruin Signed: 10/20/2022 11:50:23 AM By: Duanne Guess MD FACS Entered By: Samuella Bruin on 10/20/2022 11:00:41 -------------------------------------------------------------------------------- Debridement Details Patient Name: Date of Service: [name], MA [name]. 10/20/2022 10:30 A M Medical Record Number: [id number] Patient Account Number: [id number] Date of Birth/Sex: Treating RN: 2000/05/27 (22 y.o. [name] Primary Care Provider: Enid Skeens Other Clinician: Referring Provider: Treating Provider/Extender: Arther Dames in Treatment: 21 Debridement Performed for Assessment: Wound #8 Right,Medial Lower Leg Performed By: Physician Duanne Guess, MD Debridement Type: Debridement Level of Consciousness (Pre-procedure): Awake and Alert Pre-procedure Verification/Time Out Yes - 11:00 Taken: Start Time: 11:00 Pain Control: Lidocaine 4% Topical Solution Percent of Wound Bed Debrided: 100% T Area Debrided (cm): otal 2.36 Tissue and other material debrided: Non-Viable, Eschar, Slough, Slough Level: Non-Viable Tissue Debridement Description: Selective/Open Wound Instrument: Curette Bleeding: Minimum Hemostasis Achieved: Pressure Response to Treatment: Procedure was tolerated well Level of Consciousness (Post- Awake and Alert procedure): Post Debridement Measurements of Total Wound Length: (cm) 2 Width: (cm) 1.5 Depth: (cm) 0.1 Volume: (cm) 0.236 Character of Wound/Ulcer Post Debridement:  Improved Post Procedure Diagnosis Same as Pre-procedure Notes scribed for Dr. Lady Gary by Samuella Bruin, RN Electronic Signature(s) Signed: 10/20/2022 11:40:21 AM By: Samuella Bruin Signed: 10/20/2022 11:50:23 AM By: Duanne Guess MD FACS Entered By: Samuella Bruin on 10/20/2022 11:01:24 -------------------------------------------------------------------------------- Debridement Details Patient Name: Date of Service: [name], MA [name]. 10/20/2022 10:30 A M Medical Record Number: [id number] Patient Account Number: [id number] [name] ([id number]) [id number] Page 3 of 13 Date of Birth/Sex: Treating RN: [birth year] (22 y.o. [name] Primary Care Provider: Enid Skeens Other Clinician: Referring Provider: Treating Provider/Extender: Arther Dames in Treatment:  21 Debridement Performed for Assessment: Wound #7 Right,Anterior Lower Leg Performed By: Physician Duanne Guess, MD Debridement Type: Debridement Level of Consciousness (Pre-procedure): Awake and Alert Pre-procedure Verification/Time Out Yes - 11:00 Taken: Start Time: 11:00 Pain Control: Lidocaine 4% Topical Solution Percent of Wound Bed Debrided: 100% T Area Debrided (cm): otal 28.26 Tissue and other material debrided: Non-Viable, Eschar, Slough, Slough Level: Non-Viable Tissue Debridement Description: Selective/Open Wound Instrument: Curette Bleeding: Minimum Hemostasis Achieved: Pressure Response to Treatment: Procedure was tolerated well Level of Consciousness (Post- Awake and Alert procedure): Post Debridement Measurements of Total Wound Length: (cm) 8 Width: (cm) 4.5 Depth: (cm) 0.1 Volume: (cm) 2.827 Character of Wound/Ulcer Post Debridement: Improved Post Procedure Diagnosis Same as Pre-procedure Notes scribed for Dr. Lady Gary by Samuella Bruin, RN Electronic Signature(s) Signed: 10/20/2022 11:40:21 AM By: Samuella Bruin Signed: 10/20/2022 11:50:23 AM By: Duanne Guess MD FACS Entered By: Samuella Bruin on 10/20/2022 11:03:07 -------------------------------------------------------------------------------- Debridement Details Patient Name: Date of Service: [name], MA [name]. 10/20/2022 10:30 A M Medical Record Number: [id number] Patient Account Number: [id number] Date of Birth/Sex: Treating RN: Jun 05, 2000 (22 y.o. [name] Primary Care Provider: Enid Skeens Other Clinician: Referring Provider: Treating Provider/Extender: Arther Dames in Treatment: 21 Debridement Performed for Assessment: Wound #3 Left Hand - Dorsum Performed By: Physician Duanne Guess, MD Debridement Type: Debridement Level of Consciousness (Pre-procedure): Awake and Alert Pre-procedure Verification/Time Out Yes - 11:00 Taken: Start Time: 11:00 Pain Control: Lidocaine 4% Topical Solution Percent of Wound Bed Debrided: 100% T Area Debrided (cm): otal 6.28 Tissue and other material debrided: Non-Viable, Eschar, Slough, Slough Level: Non-Viable Tissue Debridement Description: Selective/Open Wound Instrument: Curette Bleeding: Minimum Hemostasis Achieved: Pressure Response to Treatment: Procedure was tolerated well Level of Consciousness EVALYNN, VEASY Black ([id number]) [id number] Page 4 of 13 Level of Consciousness (Post- Awake and Alert procedure): Post Debridement Measurements of Total Wound Length: (cm) 4 Width: (cm) 2 Depth: (cm) 0.1 Volume: (cm) 0.628 Character of Wound/Ulcer Post Debridement: Improved Post Procedure Diagnosis Same as Pre-procedure Notes scribed for Dr. Lady Gary by Samuella Bruin, RN Electronic Signature(s) Signed: 10/20/2022 11:40:21 AM By: Samuella Bruin Signed: 10/20/2022 11:50:23 AM By: Duanne Guess MD FACS Entered By: Samuella Bruin on 10/20/2022  11:04:00 -------------------------------------------------------------------------------- HPI Details Patient Name: Date of Service: [name], MA [name]. 10/20/2022 10:30 A M Medical Record Number: [id number] Patient Account Number: [id number] Date of Birth/Sex: Treating RN: 26-Mar-2001 (21 y.o. F) Primary Care Provider: Enid Skeens Other Clinician: Referring Provider: Treating Provider/Extender: Arther Dames in Treatment: 21 History of Present Illness HPI Description: ADMISSION 05/23/2022 This is a 22 year old young woman with Prader-Willi syndrome and the usual accompanying super morbid obesity, diabetes, and skin picking disorder. She picked a large wound in her right thigh that resulted in an emergency department visit on 17 February. Apparently her father was cleaning the wound with mupirocin and hydrogen peroxide. A culture was taken that grew out Proteus and methicillin sensitive Staph aureus. She was prescribed levofloxacin. She saw her primary care provider a few days later and based on the depth and appearance of the wound, she was referred to the wound care center for further evaluation and management. There is a large area on the patient's upper right thigh that is excoriated and scaly with dry skin that has obviously been picked at. The primary wound is 4-1/2 cm in depth. It is much cleaner than described in the ED notes. There is also a linear ulcer adjacent to  this deep site with slough accumulation. 3/15; patient presents for follow-up. She has been using iodoform packing followed by silver alginate to the wound bed. Mother is present during the encounter. There is been improvement in wound healing. 06/23/2022: The depth of the wound has come in further. The periwound skin is in improved condition with very few open areas. 07/21/2022: The wound continues to contract. Her periwound skin has completely healed. 08/18/2022: The wound measured a couple of  millimeters deeper today. No concern for infection. 09/22/2022: Unfortunately, over the past month, [name] has engaged in significant skin picking activity. She has wounds on each of her wrists, 1 on her right dorsal hand, 1 on her left shoulder, and 1 on her left anterior tibial surface. The initial wound on her right thigh is a little bit shallower. No concern for infection at any of the sites. 10/20/2022: The right wrist wound, the left wrist wound, the left shoulder wound, and the right thigh wound are healed. She has picked new wounds on her right lower leg and the left dorsal hand. There is slough and eschar on all of the wound surfaces. Electronic Signature(s) Signed: 10/20/2022 11:10:16 AM By: Duanne Guess MD FACS Entered By: Duanne Guess on 10/20/2022 11:10:16 [place], Gennett Black ([id number]) [id number] Page 5 of 13 -------------------------------------------------------------------------------- Physical Exam Details Patient Name: Date of Service: DO [name], [place] Black. 10/20/2022 10:30 A M Medical Record Number: [id number] Patient Account Number: [id number] Date of Birth/Sex: Treating RN: [birth year] (21 y.o. F) Primary Care Provider: Enid Skeens Other Clinician: Referring Provider: Treating Provider/Extender: Arther Dames in Treatment: 21 Constitutional Hypertensive, asymptomatic. Slightly tachycardic. . . no acute distress. Respiratory Normal work of breathing on room air. Notes 10/20/2022: The right wrist wound, the left wrist wound, the left shoulder wound, and the right thigh wound are healed. She has picked new wounds on her right lower leg and the left dorsal hand. There is slough and eschar on all of the wound surfaces. Electronic Signature(s) Signed: 10/20/2022 11:11:44 AM By: Duanne Guess MD FACS Entered By: Duanne Guess on 10/20/2022  11:11:44 -------------------------------------------------------------------------------- Physician Orders Details Patient Name: Date of Service: [name], MA [name]. 10/20/2022 10:30 A M Medical Record Number: [id number] Patient Account Number: [id number] Date of Birth/Sex: Treating RN: [birth year] (21 y.o. 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 - Dr. Lady Gary - room 2 Anesthetic (In clinic) Topical Lidocaine 4% applied to wound bed Bathing/ Shower/ Hygiene May shower and wash wound with soap and water. Wound Treatment Wound #3 - Hand - Dorsum Wound Laterality: Left Cleanser: Soap and Water 1 x Per Day/30 Days Discharge Instructions: May shower and wash wound with dial antibacterial soap and water prior to dressing change. KHARLIE, FARRANT ([id number]) [id number] Page 6 of 13 Cleanser: Wound Cleanser 1 x Per Day/30 Days Discharge Instructions: Cleanse the wound with wound cleanser prior to applying a clean dressing  using gauze sponges, not tissue or cotton balls. Topical: Mupirocin Ointment 1 x Per Day/30 Days Discharge Instructions: Apply Mupirocin (Bactroban) as instructed Prim Dressing: Maxorb Extra Ag+ Alginate Dressing, 2x2 (in/in) (Generic) 1 x Per Day/30  Days ary Discharge Instructions: Apply to wound bed as instructed Secondary Dressing: T Non-Adherent Dressing, 3x4 in 1 x Per Day/30 Days elfa Discharge Instructions: Apply over primary dressing as directed. Secured With: 39M Medipore H Soft Cloth Surgical T ape, 4 x 10 (in/yd) 1 x Per Day/30 Days Discharge Instructions: Secure with tape as directed. Wound #6 - Lower Leg Wound Laterality: Left, Anterior Cleanser: Soap and Water 1 x Per Day/30 Days Discharge Instructions: May shower and wash wound with dial antibacterial soap and water prior to dressing change. Cleanser: Wound Cleanser 1 x Per Day/30 Days Discharge Instructions: Cleanse the wound with wound cleanser prior to applying a clean dressing using gauze sponges, not tissue or cotton balls. Topical: Mupirocin Ointment 1 x Per Day/30 Days Discharge Instructions: Apply Mupirocin (Bactroban) as instructed Prim Dressing: Maxorb Extra Ag+ Alginate Dressing, 2x2 (in/in) (Generic) 1 x Per Day/30 Days ary Discharge Instructions: Apply to wound bed as instructed Secondary Dressing: T Non-Adherent Dressing, 3x4 in 1 x Per Day/30 Days elfa Discharge Instructions: Apply over primary dressing as directed. Secured With: 39M Medipore H Soft Cloth Surgical T ape, 4 x 10 (in/yd) 1 x Per Day/30 Days Discharge Instructions: Secure with tape as directed. Wound #7 - Lower Leg Wound Laterality: Right, Anterior Cleanser: Soap and Water 1 x Per Day/30 Days Discharge Instructions: May shower and wash wound with dial antibacterial soap and water prior to dressing change. Cleanser: Wound Cleanser 1 x Per Day/30 Days Discharge Instructions: Cleanse the wound with wound cleanser prior to applying a clean dressing using gauze sponges, not tissue or cotton balls. Topical: Mupirocin Ointment 1 x Per Day/30 Days Discharge Instructions: Apply Mupirocin (Bactroban) as instructed Prim Dressing: Maxorb Extra Ag+ Alginate Dressing, 2x2 (in/in) (Generic) 1 x Per Day/30  Days ary Discharge Instructions: Apply to wound bed as instructed Secondary Dressing: T Non-Adherent Dressing, 3x4 in 1 x Per Day/30 Days elfa Discharge Instructions: Apply over primary dressing as directed. Secured With: 39M Medipore H Soft Cloth Surgical T ape, 4 x 10 (in/yd) 1 x Per Day/30 Days Discharge Instructions: Secure with tape as directed. Wound #8 - Lower Leg Wound Laterality: Right, Medial Cleanser: Soap and Water 1 x Per Day/30 Days Discharge Instructions: May shower and wash wound with dial antibacterial soap and water prior to dressing change. Cleanser: Wound Cleanser 1 x Per Day/30 Days Discharge Instructions: Cleanse the wound with wound cleanser prior to applying a clean dressing using gauze sponges, not tissue or cotton balls. Topical: Mupirocin Ointment 1 x Per Day/30 Days Discharge Instructions: Apply Mupirocin (Bactroban) as instructed Prim Dressing: Maxorb Extra Ag+ Alginate Dressing, 2x2 (in/in) (Generic) 1 x Per Day/30 Days ary Discharge Instructions: Apply to wound bed as instructed Secondary Dressing: T Non-Adherent Dressing, 3x4 in 1 x Per Day/30 Days elfa Discharge Instructions: Apply over primary dressing as directed. Secured With: 39M Medipore H Soft Cloth Surgical T ape, 4 x 10 (in/yd) 1 x Per Day/30 Days Discharge Instructions: Secure with tape as directed. Patient Medications JAISHA, JAPP ([id number]) [id number] Page 7 of 13 llergies: gloves, latex A Notifications Medication Indication Start End 10/20/2022 lidocaine DOSE topical 4 % cream - cream topical Electronic Signature(s) Signed: 10/20/2022 11:40:21 AM By: Samuella Bruin Signed: 10/20/2022 11:50:23 AM By: Duanne Guess MD FACS Entered By: Ander Slade  Ladona Ridgel on 10/20/2022 11:14:53 -------------------------------------------------------------------------------- Problem List Details Patient Name: Date of Service: DO [name], [place] 10/20/2022 10:30 A  M Medical Record Number: [id number] Patient Account Number: [id number] Date of Birth/Sex: Treating RN: 04/09/2000 (21 y.o. 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 05/23/2022 No Yes Q87.11 Prader-Willi syndrome 05/23/2022 No Yes Z68.45 Body mass index [BMI] 70 or greater, adult 05/23/2022 No Yes E11.65 Type 2 diabetes mellitus with hyperglycemia 05/23/2022 No Yes Inactive Problems Resolved Problems ICD-10 Code Description Active Date Resolved Date L97.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 05/23/2022 05/23/2022 SHAIDA, KITTELSON Black ([id number]) [id number] Page 8 of 13 Electronic Signature(s) Signed: 10/20/2022 11:07:40 AM By: Duanne Guess MD FACS Entered By: Duanne Guess on 10/20/2022 11:07:39 -------------------------------------------------------------------------------- Progress Note Details Patient Name: Date of Service: [name], MA [name]. 10/20/2022 10:30 A M Medical Record Number: [id number] Patient Account Number: [id number] Date of Birth/Sex: Treating RN: 06/02/2000 (21 y.o. F) Primary Care Provider: Enid Skeens Other Clinician: Referring Provider: Treating Provider/Extender: Arther Dames in Treatment: 21 Subjective Chief Complaint Information obtained from Patient Patient seen for complaints of Non-Healing Wound. History of Present Illness  (HPI) ADMISSION 05/23/2022 This is a 22 year old young woman with Prader-Willi syndrome and the usual accompanying super morbid obesity, diabetes, and skin picking disorder. She picked a large wound in her right thigh that resulted in an emergency department visit on 17 February. Apparently her father was cleaning the wound with mupirocin and hydrogen peroxide. A culture was taken that grew out Proteus and methicillin sensitive Staph aureus. She was prescribed levofloxacin. She saw her primary care provider a few days later and based on the depth and appearance of the wound, she was referred to the wound care center for further evaluation and management. There is a large area on the patient's upper right thigh that is excoriated and scaly with dry skin that has obviously been picked at. The primary wound is 4-1/2 cm in depth. It is much cleaner than described in the ED notes. There is also a linear ulcer adjacent to this deep site with slough accumulation. 3/15; patient presents for follow-up. She has been using iodoform packing followed by silver alginate to the wound bed. Mother is present during the encounter. There is been improvement in wound healing. 06/23/2022: The depth of the wound has come in further. The periwound skin is in improved condition with very few open areas. 07/21/2022: The wound continues to contract. Her periwound skin has completely healed. 08/18/2022: The wound measured a couple of millimeters deeper today. No concern for infection. 09/22/2022: Unfortunately, over the past month, [name] has engaged in significant skin picking activity. She has wounds on each of her wrists, 1 on her right dorsal hand, 1 on her left shoulder, and 1 on her left anterior tibial surface. The initial wound on her right thigh is a little bit shallower. No concern for infection at any of the sites. 10/20/2022: The right wrist wound, the left wrist wound, the left shoulder wound,  and the right thigh  wound are healed. She has picked new wounds on her right lower leg and the left dorsal hand. There is slough and eschar on all of the wound surfaces. Patient History Information obtained from Caregiver. Family History Hypertension - Father, Thyroid Problems - Father, No family history of Cancer, Diabetes, Heart Disease, Hereditary Spherocytosis, Kidney Disease, Lung Disease, Seizures, Stroke, Tuberculosis. Social History Never smoker, Marital Status - Single, Alcohol Use - Never, Drug Use - No History, Caffeine Use - Never. Medical History Endocrine Patient has history of Type II Diabetes Hospitalization/Surgery History - knee surgery. - myringotomy. Medical A Surgical History Notes nd Constitutional Symptoms (General Health) obese Musculoskeletal scoliosis Neurologic Prader-willi syndrome Psychiatric compulsive skin picking FELIZA, FUHRIMAN ([id number]) [id number] Page 9 of 13 Objective Constitutional Hypertensive, asymptomatic. Slightly tachycardic. no acute distress. Vitals Time Taken: 10:42 AM, Height: 60 in, Weight: 380 lbs, BMI: 74.2, Temperature: 97.7 F, Pulse: 102 bpm, Respiratory Rate: 16 breaths/min, Blood Pressure: 172/95 mmHg. Respiratory Normal work of breathing on room air. General Notes: 10/20/2022: The right wrist wound, the left wrist wound, the left shoulder wound, and the right thigh wound are healed. She has picked new wounds on her right lower leg and the left dorsal hand. There is slough and eschar on all of the wound surfaces. Integumentary (Hair, Skin) Wound #1 status is Healed - Epithelialized. Original cause of wound was Trauma. The date acquired was: 04/07/2022. The wound has been in treatment 21 weeks. The wound is located on the Right,Lateral Upper Leg. The wound measures 0cm length x 0cm width x 0cm depth; 0cm^2 area and 0cm^3 volume. The wound is limited to skin breakdown. There is no tunneling or undermining noted. There  is a none present amount of drainage noted. The wound margin is distinct with the outline attached to the wound base. There is no granulation within the wound bed. There is no necrotic tissue within the wound bed. The periwound skin appearance had no abnormalities noted for moisture. The periwound skin appearance had no abnormalities noted for color. The periwound skin appearance exhibited: Scarring. Periwound temperature was noted as No Abnormality. Wound #2 status is Open. Original cause of wound was Skin T ear/Laceration. The date acquired was: 09/08/2022. The wound has been in treatment 4 weeks. The wound is located on the Right Hand - Dorsum. The wound measures 0cm length x 0cm width x 0cm depth; 0cm^2 area and 0cm^3 volume. There is no tunneling or undermining noted. There is a none present amount of drainage noted. The wound margin is distinct with the outline attached to the wound base. There is no granulation within the wound bed. There is no necrotic tissue within the wound bed. The periwound skin appearance had no abnormalities noted for moisture. The periwound skin appearance had no abnormalities noted for color. The periwound skin appearance exhibited: Scarring. Periwound temperature was noted as No Abnormality. Wound #3 status is Open. Original cause of wound was Skin T ear/Laceration. The date acquired was: 09/08/2022. The wound has been in treatment 4 weeks. The wound is located on the Left Hand - Dorsum. The wound measures 4cm length x 2cm width x 0.1cm depth; 6.283cm^2 area and 0.628cm^3 volume. There is Fat Layer (Subcutaneous Tissue) exposed. There is no tunneling or undermining noted. There is a medium amount of serous drainage noted. The wound margin is distinct with the outline attached to the wound base. There is small (1-33%) red, pink granulation within the wound bed. There is a large (  67-100%) amount of necrotic tissue within the wound bed including Eschar and Adherent Slough.  The periwound skin appearance had no abnormalities noted for texture. The periwound skin appearance had no abnormalities noted for moisture. The periwound skin appearance had no abnormalities noted for color. Periwound temperature was noted as No Abnormality. Wound #4 status is Open. Original cause of wound was Skin T ear/Laceration. The date acquired was: 09/08/2022. The wound has been in treatment 4 weeks. The wound is located on the Left Forearm. The wound measures 0cm length x 0cm width x 0cm depth; 0cm^2 area and 0cm^3 volume. There is no tunneling or undermining noted. There is a none present amount of drainage noted. The wound margin is distinct with the outline attached to the wound base. There is no granulation within the wound bed. There is no necrotic tissue within the wound bed. The periwound skin appearance had no abnormalities noted for texture. The periwound skin appearance had no abnormalities noted for moisture. The periwound skin appearance had no abnormalities noted for color. Periwound temperature was noted as No Abnormality. Wound #5 status is Open. Original cause of wound was Skin T ear/Laceration. The date acquired was: 09/08/2022. The wound has been in treatment 4 weeks. The wound is located on the Left Shoulder. The wound measures 0cm length x 0cm width x 0cm depth; 0cm^2 area and 0cm^3 volume. There is no tunneling or undermining noted. There is a none present amount of drainage noted. The wound margin is distinct with the outline attached to the wound base. There is no granulation within the wound bed. There is no necrotic tissue within the wound bed. The periwound skin appearance had no abnormalities noted for texture. The periwound skin appearance had no abnormalities noted for color. The periwound skin appearance exhibited: Dry/Scaly. Periwound temperature was noted as No Abnormality. Wound #6 status is Open. Original cause of wound was Skin T ear/Laceration. The date  acquired was: 09/08/2022. The wound has been in treatment 4 weeks. The wound is located on the Left,Anterior Lower Leg. The wound measures 0.4cm length x 0.2cm width x 0.1cm depth; 0.063cm^2 area and 0.006cm^3 volume. There is Fat Layer (Subcutaneous Tissue) exposed. There is no tunneling or undermining noted. There is a medium amount of serous drainage noted. The wound margin is distinct with the outline attached to the wound base. There is large (67-100%) red granulation within the wound bed. There is a small (1-33%) amount of necrotic tissue within the wound bed including Eschar and Adherent Slough. The periwound skin appearance had no abnormalities noted for moisture. The periwound skin appearance had no abnormalities noted for color. The periwound skin appearance exhibited: Scarring. Periwound temperature was noted as No Abnormality. Wound #7 status is Open. Original cause of wound was Skin T ear/Laceration. The date acquired was: 09/25/2022. The wound is located on the Right,Anterior Lower Leg. The wound measures 8cm length x 4.5cm width x 0.1cm depth; 28.274cm^2 area and 2.827cm^3 volume. There is Fat Layer (Subcutaneous Tissue) exposed. There is no tunneling or undermining noted. There is a medium amount of serosanguineous drainage noted. The wound margin is distinct with the outline attached to the wound base. There is small (1-33%) red granulation within the wound bed. There is a large (67-100%) amount of necrotic tissue within the wound bed including Eschar and Adherent Slough. The periwound skin appearance had no abnormalities noted for texture. The periwound skin appearance had no abnormalities noted for moisture. The periwound skin appearance exhibited: Rubor. Periwound temperature was noted  as No Abnormality. Wound #8 status is Open. Original cause of wound was Skin T ear/Laceration. The date acquired was: 09/25/2022. The wound is located on the Right,Medial Lower Leg. The wound measures 2cm  length x 1.5cm width x 0.1cm depth; 2.356cm^2 area and 0.236cm^3 volume. There is Fat Layer (Subcutaneous Tissue) exposed. There is no tunneling or undermining noted. There is a medium amount of serosanguineous drainage noted. The wound margin is distinct with the outline attached to the wound base. There is small (1-33%) red granulation within the wound bed. There is a large (67-100%) amount of necrotic tissue within the wound bed including Eschar and Adherent Slough. The periwound skin appearance had no abnormalities noted for moisture. The periwound skin appearance had no abnormalities noted for color. The periwound skin appearance exhibited: Scarring. Periwound temperature was noted as No Abnormality. 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 There was a Selective/Open Wound Non-Viable Tissue Debridement with a total area of 6.28 sq cm performed by Duanne Guess, MD. With the following instrument(s): Curette to remove Non-Viable tissue/material. Material removed includes Eschar and Slough and after achieving pain control using Lidocaine 4% T opical Solution. No specimens were taken. A time out was conducted at 11:00, prior to the start of the procedure. A Minimum amount of bleeding was controlled with Pressure. The procedure was tolerated well. Post Debridement Measurements: 4cm length x 2cm width x 0.1cm depth; 0.628cm^3  volume. Character of Wound/Ulcer Post Debridement is improved. Post procedure Diagnosis Wound #3: Same as Pre-Procedure General Notes: scribed for Dr. Lady Gary by Samuella Bruin, RN. Wound #6 Pre-procedure diagnosis of Wound #6 is an Abrasion located on the Left,Anterior Lower Leg . There was a Selective/Open Wound Non-Viable Tissue Debridement with a total area of 0.06 sq cm performed by Duanne Guess, MD. With the following instrument(s): Curette to remove Non-Viable tissue/material. Material removed includes Eschar and Slough and after achieving pain control using Lidocaine 4% Topical Solution. No specimens were taken. A time out was conducted at 11:00, prior to the start of the procedure. A Minimum amount of bleeding was controlled with Pressure. The procedure was tolerated well. Post Debridement Measurements: 0.4cm length x 0.2cm width x 0.1cm depth; 0.006cm^3 volume. Character of Wound/Ulcer Post Debridement is improved. Post procedure Diagnosis Wound #6: Same as Pre-Procedure General Notes: scribed for Dr. Lady Gary by Samuella Bruin, RN. Wound #7 Pre-procedure diagnosis of Wound #7 is an Abrasion located on the Right,Anterior Lower Leg . There was a Selective/Open Wound Non-Viable Tissue Debridement with a total area of 28.26 sq cm performed by Duanne Guess, MD. With the following instrument(s): Curette to remove Non-Viable tissue/material. Material removed includes Eschar and Slough and after achieving pain control using Lidocaine 4% Topical Solution. No specimens were taken. A time out was conducted at 11:00, prior to the start of the procedure. A Minimum amount of bleeding was controlled with Pressure. The procedure was tolerated well. Post Debridement Measurements: 8cm length x 4.5cm width x 0.1cm depth; 2.827cm^3 volume. Character of Wound/Ulcer Post Debridement is  improved. Post procedure Diagnosis Wound #7: Same as Pre-Procedure General Notes: scribed for Dr. Lady Gary by  Samuella Bruin, RN. Wound #8 Pre-procedure diagnosis of Wound #8 is an Abrasion located on the Right,Medial Lower Leg . There was a Selective/Open Wound Non-Viable Tissue Debridement with a total area of 2.36 sq cm performed by Duanne Guess, MD. With the following instrument(s): Curette to remove Non-Viable tissue/material. Material removed includes Eschar and Slough and after achieving pain control using Lidocaine 4% Topical Solution. No specimens were taken. A time out was conducted at 11:00, prior to the start of the procedure. A Minimum amount of bleeding was controlled with Pressure. The procedure was tolerated well. Post Debridement Measurements: 2cm length x 1.5cm width x 0.1cm depth; 0.236cm^3 volume. Character of Wound/Ulcer Post Debridement is improved. Post procedure Diagnosis Wound #8: Same as Pre-Procedure General Notes: scribed for Dr. Lady Gary by Samuella Bruin, RN. Plan Follow-up Appointments: Return appointment in 1 month. - Dr. Lady Gary - room 2 Anesthetic: (In clinic) Topical Lidocaine 4% applied to wound bed Bathing/ Shower/ Hygiene: May shower and wash wound with soap and water. The following medication(s) was prescribed: lidocaine topical 4 % cream cream topical was prescribed at facility WOUND #3: - Hand - Dorsum Wound Laterality: Left Cleanser: Soap and Water 1 x Per Day/30 Days Discharge Instructions: May shower and wash wound with dial antibacterial soap and water prior to dressing change. Cleanser: Wound Cleanser 1 x Per Day/30 Days Discharge Instructions: Cleanse the wound with wound cleanser prior to applying a clean dressing using gauze sponges, not tissue or cotton balls. Topical: Mupirocin Ointment 1 x Per Day/30 Days Discharge Instructions: Apply Mupirocin (Bactroban) as instructed Prim Dressing: Maxorb Extra Ag+ Alginate Dressing, 2x2 (in/in) (Generic) 1 x Per Day/30 Days ary Discharge Instructions: Apply to wound bed as instructed Secondary  Dressing: T Non-Adherent Dressing, 3x4 in 1 x Per Day/30 Days elfa Discharge Instructions: Apply over primary dressing as directed. Secured With: 74M Medipore H Soft Cloth Surgical T ape, 4 x 10 (in/yd) 1 x Per Day/30 Days Discharge Instructions: Secure with tape as directed. WOUND #6: - Lower Leg Wound Laterality: Left, Anterior Cleanser: Soap and Water 1 x Per Day/30 Days Discharge Instructions: May shower and wash wound with dial antibacterial soap and water prior to dressing change. Cleanser: Wound Cleanser 1 x Per Day/30 Days Discharge Instructions: Cleanse the wound with wound cleanser prior to applying a clean dressing using gauze sponges, not tissue or cotton balls. Topical: Mupirocin Ointment 1 x Per Day/30 Days Discharge Instructions: Apply Mupirocin (Bactroban) as instructed Prim Dressing: Maxorb Extra Ag+ Alginate Dressing, 2x2 (in/in) (Generic) 1 x Per Day/30 Days ary Discharge Instructions: Apply to wound bed as instructed BRYELLA, OLDHAM ([id number]) [id number] Page 11 of 13 Secondary Dressing: T Non-Adherent Dressing, 3x4 in 1 x Per Day/30 Days elfa Discharge Instructions: Apply over primary dressing as directed. Secured With: 74M Medipore H Soft Cloth Surgical T ape, 4 x 10 (in/yd) 1 x Per Day/30 Days Discharge Instructions: Secure with tape as directed. WOUND #7: - Lower Leg Wound Laterality: Right, Anterior Cleanser: Soap and Water 1 x Per Day/30 Days Discharge Instructions: May shower and wash wound with dial antibacterial soap and water prior to dressing change. Cleanser: Wound Cleanser 1 x Per Day/30 Days Discharge Instructions: Cleanse the wound with wound cleanser prior to applying a clean dressing using gauze sponges, not tissue or cotton balls. Topical: Mupirocin Ointment 1 x Per Day/30 Days Discharge Instructions: Apply Mupirocin (Bactroban) as instructed Prim Dressing: Maxorb  Extra Ag+ Alginate Dressing, 2x2 (in/in) (Generic) 1 x  Per Day/30 Days ary Discharge Instructions: Apply to wound bed as instructed Secondary Dressing: T Non-Adherent Dressing, 3x4 in 1 x Per Day/30 Days elfa Discharge Instructions: Apply over primary dressing as directed. Secured With: 673M Medipore H Soft Cloth Surgical T ape, 4 x 10 (in/yd) 1 x Per Day/30 Days Discharge Instructions: Secure with tape as directed. WOUND #8: - Lower Leg Wound Laterality: Right, Medial Cleanser: Soap and Water 1 x Per Day/30 Days Discharge Instructions: May shower and wash wound with dial antibacterial soap and water prior to dressing change. Cleanser: Wound Cleanser 1 x Per Day/30 Days Discharge Instructions: Cleanse the wound with wound cleanser prior to applying a clean dressing using gauze sponges, not tissue or cotton balls. Topical: Mupirocin Ointment 1 x Per Day/30 Days Discharge Instructions: Apply Mupirocin (Bactroban) as instructed Prim Dressing: Maxorb Extra Ag+ Alginate Dressing, 2x2 (in/in) (Generic) 1 x Per Day/30 Days ary Discharge Instructions: Apply to wound bed as instructed Secondary Dressing: T Non-Adherent Dressing, 3x4 in 1 x Per Day/30 Days elfa Discharge Instructions: Apply over primary dressing as directed. Secured With: 673M Medipore H Soft Cloth Surgical T ape, 4 x 10 (in/yd) 1 x Per Day/30 Days Discharge Instructions: Secure with tape as directed. 10/20/2022: The right wrist wound, the left wrist wound, the left shoulder wound, and the right thigh wound are healed. She has picked new wounds on her right lower leg and the left dorsal hand. There is slough and eschar on all of the wound surfaces. I used a curette to debride slough and eschar from all of the open wound surfaces. We will continue mupirocin with silver alginate and foam border dressings. She will follow-up in 1 month. Electronic Signature(s) Signed: 10/20/2022 11:40:21 AM By: Samuella Bruin Signed: 10/20/2022 11:50:23 AM By: Duanne Guess MD FACS Previous Signature:  10/20/2022 11:13:23 AM Version By: Duanne Guess MD FACS Entered By: Samuella Bruin on 10/20/2022 11:21:16 -------------------------------------------------------------------------------- HxROS Details Patient Name: Date of Service: [name], MA [name]. 10/20/2022 10:30 A M Medical Record Number: [id number] Patient Account Number: [id number] Date of Birth/Sex: Treating RN: 02-Jun-2000 (21 y.o. F) Primary Care Provider: Enid Skeens Other Clinician: Referring Provider: Treating Provider/Extender: Arther Dames in Treatment: 21 Information Obtained From Caregiver Constitutional Symptoms (General Health) Medical History: Past Medical History Notes: obese Endocrine Medical History: Positive for: Type II Diabetes Time with diabetes: 3 yrs Treated with: Insulin Blood sugar tested every day: Yes Tested DEMETRIA, ROLAND ([id number]) [id number] Page 12 of 13 Musculoskeletal Medical History: Past Medical History Notes: scoliosis Neurologic Medical History: Past Medical History Notes: Prader-willi syndrome Psychiatric Medical History: Past Medical History Notes: compulsive skin picking Immunizations Pneumococcal Vaccine: Received Pneumococcal Vaccination: No Implantable Devices None Hospitalization / Surgery History Type of Hospitalization/Surgery knee surgery myringotomy Family and Social History Cancer: No; Diabetes: No; Heart Disease: No; Hereditary Spherocytosis: No; Hypertension: Yes - Father; Kidney Disease: No; Lung Disease: No; Seizures: No; Stroke: No; Thyroid Problems: Yes - Father; Tuberculosis: No; Never smoker; Marital Status - Single; Alcohol Use: Never; Drug Use: No History; Caffeine Use: Never; Financial Concerns: No; Food, Clothing or Shelter Needs: No; Support System Lacking: No; Transportation Concerns: No Electronic Signature(s) Signed: 10/20/2022 11:50:23 AM By: Duanne Guess MD FACS Entered  By: Duanne Guess on 10/20/2022 11:11:12 -------------------------------------------------------------------------------- SuperBill Details Patient Name: Date of Service: [name], MA [name]. 10/20/2022 Medical Record Number: [id number] Patient Account Number: [id number] Date of Birth/Sex: Treating  RN: May 23, 2000 (21 y.o. F) Primary Care Provider: Enid Skeens Other Clinician: Referring Provider: Treating Provider/Extender: Arther Dames in Treatment: 21 Diagnosis Coding ICD-10 Codes Code Description [phone] 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osed L98.492 Non-pressure chronic ulcer of skin 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 L97.822 Non-pressure chronic ulcer of other part of left lower leg with fat layer exposed F42.4 Excoriation (skin-picking) disorder Q87.11 Prader-Willi syndrome Z68.45 Body mass index [BMI] 70 or greater, adult E11.65 Type 2 diabetes mellitus with hyperglycemia Facility Procedures : QUNESHA, WASHABAUGH Code: [id number] Ioanna Black [id number] ICD- Description: 97597 - DEBRIDE WOUND 1ST 20 SQ CM OR < 2992) [phone] Diagnosis Description L97.812 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osed L98.492 Non-pressure chronic ulcer of skin of other sites  with fat layer exposed L97.822 Non-pressure chronic ulcer of other part of left lower leg with fat layer exposed Modifier: [id number] Quantity: 1 7.pdf Page 13 of 13 : CPT4 Code: [id number] Description: 7598 - DEBRIDE WOUND EA ADDL 20 SQ CM ICD-10 Diagnosis Description L97.812 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osed L98.492 Non-pressure chronic ulcer of skin 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  L97.822 Non-pressure chronic ulcer of other part of left lower leg with fat layer exposed Modifier: Quantity: 1 Physician Procedures : CPT4 Code Description Modifier [id number]  [id number] - WC PHYS LEVEL 4 - EST PT 25 ICD-10 Diagnosis Description L97.812 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osed L98.492 Non-pressure chronic ulcer of skin of other sites  with fat layer exposed L97.822 Non-pressure chronic ulcer of other part of left lower leg with fat layer exposed F42.4 Excoriation (skin-picking) disorder Quantity: 1 : [id number] - WC PHYS DEBR WO ANESTH 20 SQ CM ICD-10 Diagnosis Description L97.812 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osed L98.492 Non-pressure chronic ulcer of skin 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  L97.822 Non-pressure chronic ulcer of other part of left lower leg with fat layer exposed Quantity: 1 : [id number] 97598 - WC PHYS DEBR WO ANESTH EA ADD 20 CM ICD-10 Diagnosis Description L97.812 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osed L98.492 Non-pressure chronic ulcer of skin 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  L97.822 Non-pressure chronic ulcer of other part of left lower leg with fat layer exposed Quantity: 1 Electronic Signature(s) Signed: 10/20/2022 11:13:50 AM By: Duanne Guess MD FACS Entered By: Duanne Guess on 10/20/2022 11:13:50

## 2022-10-20 NOTE — Progress Notes (Signed)
[name], [name] ([id number]) [id number] Page 1 of 15 Visit Report for 10/20/2022 Arrival Information Details Patient Name: Date of Service: DO [name], [place] 10/20/2022 10:30 A M Medical Record Number: [id number] Patient Account Number: [id number] Date of Birth/Sex: Treating RN: Nov 12, 2000 (21 y.o. [name], [name] Primary Care [name]: [name] Other Clinician: Referring Zavian Slowey: Treating [name]/Extender: [name] in Treatment: 21 Visit Information History Since Last Visit Added or deleted any medications: No Patient Arrived: [name] Any new allergies or adverse reactions: No Arrival Time: 10:41 Had a fall or experienced change in No Accompanied By: father activities of daily living that may affect Transfer Assistance: Manual risk of falls: Patient Identification Verified: Yes Signs or symptoms of abuse/neglect since last visito No Secondary Verification Process Completed: Yes Hospitalized since last visit: No Patient Requires Transmission-Based Precautions: No Implantable device outside of the clinic excluding No cellular tissue based products placed in the center since last visit: Has Dressing in Place as Prescribed: Yes Pain Present Now: No Electronic Signature(s) Signed: 10/20/2022 11:40:21 AM By: [name] Entered By: [name] on 10/20/2022 10:42:11 -------------------------------------------------------------------------------- Encounter Discharge Information Details Patient Name: Date of Service: [name], MA [name]. 10/20/2022 10:30 A M Medical Record Number: [id number] Patient Account Number: [id number] Date of Birth/Sex: Treating RN: 15-Nov-2000 (21 y.o. [name] Primary Care [name]: [name] Other Clinician: Referring [name]: Treating [name]/Extender: [name] in Treatment: 21 Encounter Discharge Information Items Post  Procedure Vitals Discharge Condition: Stable Temperature (F): 97.7 Ambulatory Status: Walker Pulse (bpm): 102 Discharge Destination: Home Respiratory Rate (breaths/min): 16 Transportation: Private Auto Blood Pressure (mmHg): 172/95 Accompanied By: father Schedule Follow-up Appointment: Yes Clinical Summary of Care: Patient Declined Electronic Signature(s) Signed: 10/20/2022 11:40:21 AM By: [name] Entered By: [name] on 10/20/2022 11:20:59 [name], [name] ([id number]) [id number] Page 2 of 15 -------------------------------------------------------------------------------- Lower Extremity Assessment Details Patient Name: Date of Service: DO [name], [place]. 10/20/2022 10:30 A M Medical Record Number: [id number] Patient Account Number: [id number] Date of Birth/Sex: Treating RN: 2000-11-22 (21 y.o. [name] Primary Care [name]: [name] Other Clinician: Referring [name]: Treating [name]/Extender: [name] in Treatment: 21 Electronic Signature(s) Signed: 10/20/2022 11:40:21 AM By: [name] By: [name] on 10/20/2022 10:42:37 -------------------------------------------------------------------------------- Multi Wound Chart Details Patient Name: Date of Service: [name], MA [name]. 10/20/2022 10:30 A M Medical Record Number: [id number] Patient Account Number: [id number] Date of Birth/Sex: Treating RN: 11-Aug-2000 (21 y.o. F) Primary Care [name]: [name] Other Clinician: Referring Kasee Hantz: Treating [name]/Extender: [name] in Treatment: 21 Vital Signs Height(in): 60 Pulse(bpm): 102 Weight(lbs): 380 Blood Pressure(mmHg): 172/95 Body Mass Index(BMI): 74.2 Temperature(F): 97.7 Respiratory Rate(breaths/min): 16 [1:Photos: No Photos Right, Lateral Upper Leg Wound Location: Trauma Wounding Event: Trauma, Other Primary  Etiology: Type II Diabetes Comorbid History: 04/07/2022 Date Acquired: 21 Weeks of Treatment: Healed - Epithelialized Wound Status: No Wound Recurrence: 0x0x0  Measurements L x W x D (cm) 0 A (cm) : rea 0 Volume (cm) : 100.00% % Reduction in A rea: 100.00% % Reduction in Volume: Full Thickness Without Exposed Classification: Support Structures None Present Exudate A mount: N/A Exudate Type: N/A Exudate Color:  Distinct, outline attached Wound Margin: None Present (0%) Granulation Amount: N/A Granulation Quality: None Present (0%) Necrotic Amount: N/A Necrotic Tissue: Fascia: No Exposed Structures: Fat Layer (Subcutaneous Tissue): No Tendon: No Muscle: No  Joint: No Bone: No  Limited to Skin Breakdown Large (67-100%) Epithelialization: N/A Debridement:] [2:No Photos Right Hand - Dorsum Skin T ear/Laceration Abrasion Type II Diabetes 09/08/2022 4 Open No 0x0x0 0 0 100.00% 100.00% Full Thickness Without  Exposed Support Structures None Present N/A N/A Distinct, outline attached None Present (0%) N/A None Present (0%) N/A Fascia: No Fat Layer (Subcutaneous Tissue): No Tendon: No Muscle: No Joint: No Bone: No Large (67-100%) N/A] [3:No Photos Left Hand -  Dorsum Skin T ear/Laceration Abrasion Type II Diabetes 09/08/2022 4 Open No 4x2x0.1 6.283 0.628 -788.70% -784.50% Full Thickness Without Exposed Support Structures Medium Serous amber Distinct, outline attached Small (1-33%) Red, Pink Large (67-100%)  Eschar, Adherent Slough Fat Layer (Subcutaneous Tissue): Yes Fascia: No Tendon: No Muscle: No Joint: No Bone: No Small (1-33%) Debridement - Selective/Open Wound] [name] ([id number]) [1:N/A Pre-procedure Verification/Time Out Taken: N/A Pain Control: N/A Tissue Debrided: N/A Level: N/A Debridement A (sq cm): rea N/A Instrument: N/A Bleeding: N/A Hemostasis A chieved: N/A Debridement Treatment Response: N/A Post Debridement  Measurements L x W x D (cm) N/A Post Debridement Volume: (cm) Scarring: Yes  Periwound Skin Texture: Dry/Scaly: Yes Periwound Skin Moisture: No Abnormalities Noted Periwound Skin Color: No Abnormality Temperature: N/A Procedures Performed:] [2:N/A N/A N/A  N/A N/A N/A N/A N/A N/A N/A N/A Scarring: Yes No Abnormalities Noted No Abnormalities Noted No Abnormality N/A] [3:[id number] Page 3 of 15 11:00 Lidocaine 4% Topical Solution Necrotic/Eschar, Slough Non-Viable Tissue 6.28  Curette Minimum Pressure Procedure was tolerated well 4x2x0.1 0.628 No Abnormalities Noted No Abnormalities Noted No Abnormalities Noted No Abnormality Debridement] Wound Number: 4 5 6  Photos: No Photos No Photos No Photos Left Forearm Left Shoulder Left, Anterior Lower Leg Wound Location: Skin T ear/Laceration Skin T ear/Laceration Skin T ear/Laceration Wounding Event: Abrasion Abrasion Abrasion Primary Etiology: Type II Diabetes Type II Diabetes Type II Diabetes Comorbid History: 09/08/2022 09/08/2022 09/08/2022 Date Acquired: 4 4 4  Weeks of Treatment: Open Open Open Wound Status: No No No Wound Recurrence: 0x0x0 0x0x0 0.4x0.2x0.1 Measurements L x W x D (cm) 0 0 0.063 A (cm) : rea 0 0 0.006 Volume (cm) : 100.00% 100.00% 77.10% % Reduction in A rea: 100.00% 100.00% 77.80% % Reduction in Volume: Full Thickness Without Exposed Full Thickness Without Exposed Full Thickness Without Exposed Classification: Support Structures Support Structures Support Structures None Present None Present Medium Exudate A mount: N/A N/A Serous Exudate Type: N/A N/A amber Exudate Color: Distinct, outline attached Distinct, outline attached Distinct, outline attached Wound Margin: None Present (0%) None Present (0%) Large (67-100%) Granulation A mount: N/A N/A Red Granulation Quality: None Present (0%) None Present (0%) Small (1-33%) Necrotic A mount: N/A N/A Eschar, Adherent Slough Necrotic Tissue: Fascia: No Fascia: No Fat Layer (Subcutaneous Tissue): Yes Exposed  Structures: Fat Layer (Subcutaneous Tissue): No Fat Layer (Subcutaneous Tissue): No Fascia: No Tendon: No Tendon: No Tendon: No Muscle: No Muscle: No Muscle: No Joint: No Joint: No Joint: No Bone: No Bone: No Bone: No Large (67-100%) Large (67-100%) Small (1-33%) Epithelialization: N/A N/A Debridement - Selective/Open Wound Debridement: Pre-procedure Verification/Time Out N/A N/A 11:00 Taken: N/A N/A Lidocaine 4% Topical Solution Pain Control: N/A N/A Necrotic/Eschar, Slough Tissue Debrided: N/A N/A Non-Viable Tissue Level: N/A N/A 0.06 Debridement A (sq cm): rea N/A N/A Curette Instrument: N/A N/A Minimum Bleeding: N/A N/A Pressure Hemostasis A chieved: N/A N/A Procedure was tolerated well Debridement Treatment Response: N/A N/A 0.4x0.2x0.1 Post Debridement Measurements L x W x D (cm) N/A N/A 0.006 Post Debridement Volume: (cm) No  Abnormalities Noted No Abnormalities Noted Scarring: Yes Periwound Skin Texture: No Abnormalities Noted Dry/Scaly: Yes No Abnormalities Noted Periwound Skin Moisture: No Abnormalities Noted No Abnormalities Noted No Abnormalities Noted Periwound Skin Color: No Abnormality No Abnormality No Abnormality Temperature: N/A N/A Debridement Procedures Performed: Wound Number: 7 8 N/A Photos: No Photos No Photos N/A Right, Anterior Lower Leg Right, Medial Lower Leg N/A Wound Location: Skin Tear/Laceration Skin Tear/Laceration N/A Wounding Event: Abrasion Abrasion N/A Primary Etiology: Type II Diabetes Type II Diabetes N/A Comorbid History: 09/25/2022 09/25/2022 N/A Date Acquired: 0 0 N/A Weeks of Treatment: Open Open N/A Wound Status: No No N/A Wound Recurrence: 8x4.5x0.1 2x1.5x0.1 N/A Measurements L x W x D (cm) 28.274 2.356 N/A A (cm) : rea 2.827 0.236 N/A Volume (cm) : N/A N/A N/A % Reduction in A reaSIMCHA, [name] ([id number]) [id number] Page 4 of 15 N/A N/A N/A % Reduction in  Volume: Full Thickness Without Exposed Full Thickness Without Exposed N/A Classification: Support Structures Support Structures Medium Medium N/A Exudate A mount: Serosanguineous Serosanguineous N/A Exudate Type: red, brown red, brown N/A Exudate Color: Distinct, outline attached Distinct, outline attached N/A Wound Margin: Small (1-33%) Small (1-33%) N/A Granulation A mount: Red Red N/A Granulation Quality: Large (67-100%) Large (67-100%) N/A Necrotic A mount: Eschar, Adherent Slough Eschar, Adherent Slough N/A Necrotic Tissue: Fat Layer (Subcutaneous Tissue): Yes Fat Layer (Subcutaneous Tissue): Yes N/A Exposed Structures: Fascia: No Fascia: No Tendon: No Tendon: No Muscle: No Muscle: No Joint: No Joint: No Bone: No Bone: No Small (1-33%) Small (1-33%) N/A Epithelialization: Debridement - Selective/Open Wound Debridement - Selective/Open Wound N/A Debridement: Pre-procedure Verification/Time Out 11:00 11:00 N/A Taken: Lidocaine 4% Topical Solution Lidocaine 4% Topical Solution N/A Pain Control: Necrotic/Eschar, Ambulance person, Slough N/A Tissue Debrided: Non-Viable Tissue Non-Viable Tissue N/A Level: 28.26 2.36 N/A Debridement A (sq cm): rea Curette Curette N/A Instrument: Minimum Minimum N/A Bleeding: Pressure Pressure N/A Hemostasis A chieved: Procedure was tolerated well Procedure was tolerated well N/A Debridement Treatment Response: 8x4.5x0.1 2x1.5x0.1 N/A Post Debridement Measurements L x W x D (cm) 2.827 0.236 N/A Post Debridement Volume: (cm) No Abnormalities Noted Scarring: Yes N/A Periwound Skin Texture: No Abnormalities Noted No Abnormalities Noted N/A Periwound Skin Moisture: Rubor: Yes No Abnormalities Noted N/A Periwound Skin Color: No Abnormality No Abnormality N/A Temperature: Debridement Debridement N/A Procedures Performed: Treatment Notes Electronic Signature(s) Signed: 10/20/2022 11:08:37 AM By: Duanne Guess MD  FACS Entered By: Duanne Guess on 10/20/2022 11:08:37 -------------------------------------------------------------------------------- Multi-Disciplinary Care Plan Details Patient Name: Date of Service: [name], MA [name]. 10/20/2022 10:30 A M Medical Record Number: [id number] Patient Account Number: [id number] Date of Birth/Sex: Treating RN: Sep 29, 2000 (21 y.o. [name] Primary Care Raoul Ciano: [name] Other Clinician: Referring Erickson Yamashiro: Treating Minas Bonser/Extender: [name] in Treatment: 21 Active Inactive Necrotic Tissue Nursing Diagnoses: Impaired tissue integrity related to necrotic/devitalized tissue Knowledge deficit related to management of necrotic/devitalized tissue Goals: Necrotic/devitalized tissue will be minimized in the wound bed Date Initiated: 05/23/2022 Target Resolution Date: 11/30/2022 Goal Status: Active Patient/caregiver will verbalize understanding of reason and process for debridement of necrotic tissue Date Initiated: 05/23/2022 Target Resolution Date: 11/30/2022 Goal Status: Active [name], [name] ([id number]) [id number] Page 5 of 15 Interventions: Assess patient pain level pre-, during and post procedure and prior to discharge Provide education on necrotic tissue and debridement process Treatment Activities: Apply topical anesthetic as ordered : 05/23/2022 Notes: Wound/Skin Impairment Nursing Diagnoses: Impaired tissue integrity Knowledge deficit related to ulceration/compromised skin integrity Goals:  Patient/caregiver will verbalize understanding of skin care regimen Date Initiated: 05/23/2022 Target Resolution Date: 11/30/2022 Goal Status: Active Interventions: Assess patient/caregiver ability to perform ulcer/skin care regimen upon admission and as needed Assess ulceration(s) every visit Treatment Activities: Skin care regimen initiated : 05/23/2022 Topical wound management  initiated : 05/23/2022 Notes: Electronic Signature(s) Signed: 10/20/2022 11:40:21 AM By: [name] Entered By: [name] on 10/20/2022 11:20:12 -------------------------------------------------------------------------------- Pain Assessment Details Patient Name: Date of Service: [name], MA [name]. 10/20/2022 10:30 A M Medical Record Number: [id number] Patient Account Number: [id number] Date of Birth/Sex: Treating RN: [birth year] (21 y.o. [name] Primary Care Marcellas Marchant: [name] Other Clinician: Referring Kimball Appleby: Treating Lexia Vandevender/Extender: [name] in Treatment: 21 Active Problems Location of Pain Severity and Description of Pain Patient Has Paino No Site Locations Rate the pain. Current Pain Level: 0 Pain Management and Medication Current Pain Management: SAANJH, NERBY ([id number]) [id number] Page 6 of 15 Electronic Signature(s) Signed: 10/20/2022 11:40:21 AM By: [name] By: [name] on 10/20/2022 10:42:27 -------------------------------------------------------------------------------- Patient/Caregiver Education Details Patient Name: Date of Service: [name], MA DISO Anders Simmonds 7/26/2024andnbsp10:30 A M Medical Record Number: [id number] Patient Account Number: [id number] Date of Birth/Gender: Treating RN: 17-Sep-2000 (21 y.o. [name] Primary Care Physician: [name] Other Clinician: Referring Physician: Treating Physician/Extender: [name] in Treatment: 21 Education Assessment Education Provided To: Patient Education Topics Provided Safety: Methods: Explain/Verbal Responses: Reinforcements needed, State content correctly Electronic Signature(s) Signed: 10/20/2022 11:40:21 AM By: [name] Entered By: [name] on 10/20/2022  11:20:25 -------------------------------------------------------------------------------- Wound Assessment Details Patient Name: Date of Service: [name], MA [name]. 10/20/2022 10:30 A M Medical Record Number: [id number] Patient Account Number: [id number] Date of Birth/Sex: Treating RN: September 09, 2000 (21 y.o. [name] Primary Care Dhalia Zingaro: [name] Other Clinician: Referring Taffy Delconte: Treating Arrin Pintor/Extender: [name] in Treatment: 21 Wound Status Wound Number: 1 Primary Etiology: Trauma, Other Wound Location: Right, Lateral Upper Leg Wound Status: Healed - Epithelialized Wounding Event: Trauma Comorbid History: Type II Diabetes Date Acquired: 04/07/2022 Weeks Of Treatment: 21 Clustered Wound: No Wound Measurements Length: (cm) Width: (cm) Depth: (cm) Area: (cm) Volume: (cm) 0 % Reduction in Area: 100% 0 % Reduction in Volume: 100% 0 Epithelialization: Large (67-100%) 0 Tunneling: No 0 Undermining: No Wound Description [name], [name] ([id number]) Classification: Full Thickness Without Exposed Support Structures Wound Margin: Distinct, outline attached Exudate Amount: None Present [id number] Page 7 of 15 Foul Odor After Cleansing: No Slough/Fibrino No Wound Bed Granulation Amount: None Present (0%) Exposed Structure Necrotic Amount: None Present (0%) Fascia Exposed: No Fat Layer (Subcutaneous Tissue) Exposed: No Tendon Exposed: No Muscle Exposed: No Joint Exposed: No Bone Exposed: No Limited to Skin Breakdown Periwound Skin Texture Texture Color No Abnormalities Noted: No No Abnormalities Noted: Yes Scarring: Yes Temperature / Pain Temperature: No Abnormality Moisture No Abnormalities Noted: Yes Electronic Signature(s) Signed: 10/20/2022 11:40:21 AM By: [name] Entered By: [name] on 10/20/2022  11:03:33 -------------------------------------------------------------------------------- Wound Assessment Details Patient Name: Date of Service: [name], MA [name]. 10/20/2022 10:30 A M Medical Record Number: [id number] Patient Account Number: [id number] Date of Birth/Sex: Treating RN: Dec 21, 2000 (21 y.o. [name] Primary Care Knox Holdman: [name] Other Clinician: Referring Sarh Kirschenbaum: Treating Stark Aguinaga/Extender: [name] in Treatment: 21 Wound Status Wound Number: 2 Primary Etiology: Abrasion Wound Location: Right Hand - Dorsum Wound Status: Open Wounding Event: Skin Tear/Laceration Comorbid History:  Type II Diabetes Date Acquired: 09/08/2022 Weeks Of Treatment: 4 Clustered Wound: No Wound Measurements Length: (cm) Width: (cm) Depth: (cm) Area: (cm) Volume: (cm) 0 % Reduction in Area: 100% 0 % Reduction in Volume: 100% 0 Epithelialization: Large (67-100%) 0 Tunneling: No 0 Undermining: No Wound Description Classification: Full Thickness Without Exposed Suppor Wound Margin: Distinct, outline attached Exudate Amount: None Present t Structures Foul Odor After Cleansing: No Slough/Fibrino No Wound Bed Granulation Amount: None Present (0%) Exposed Structure Necrotic Amount: None Present (0%) Fascia Exposed: No Fat Layer (Subcutaneous Tissue) Exposed: No Tendon Exposed: No Muscle Exposed: No Joint Exposed: No Bone Exposed: No Periwound Skin Texture [name], [name] ([id number]) [id number] Page 8 of 15 Texture Color No Abnormalities Noted: No No Abnormalities Noted: Yes Scarring: Yes Temperature / Pain Temperature: No Abnormality Moisture No Abnormalities Noted: Yes Electronic Signature(s) Signed: 10/20/2022 11:40:21 AM By: [name] Entered By: [name] on 10/20/2022 10:58:41 -------------------------------------------------------------------------------- Wound Assessment  Details Patient Name: Date of Service: [name], MA [name]. 10/20/2022 10:30 A M Medical Record Number: [id number] Patient Account Number: [id number] Date of Birth/Sex: Treating RN: 2000-07-13 (21 y.o. [name] Primary Care Daurice Ovando: [name] Other Clinician: Referring Mariem Skolnick: Treating Natalin Bible/Extender: [name] in Treatment: 21 Wound Status Wound Number: 3 Primary Etiology: Abrasion Wound Location: Left Hand - Dorsum Wound Status: Open Wounding Event: Skin Tear/Laceration Comorbid History: Type II Diabetes Date Acquired: 09/08/2022 Weeks Of Treatment: 4 Clustered Wound: No Wound Measurements Length: (cm) 4 Width: (cm) 2 Depth: (cm) 0.1 Area: (cm) 6.283 Volume: (cm) 0.628 % Reduction in Area: -788.7% % Reduction in Volume: -784.5% Epithelialization: Small (1-33%) Tunneling: No Undermining: No Wound Description Classification: Full Thickness Without Exposed Support Structures Wound Margin: Distinct, outline attached Exudate Amount: Medium Exudate Type: Serous Exudate Color: amber Foul Odor After Cleansing: No Slough/Fibrino Yes Wound Bed Granulation Amount: Small (1-33%) Exposed Structure Granulation Quality: Red, Pink Fascia Exposed: No Necrotic Amount: Large (67-100%) Fat Layer (Subcutaneous Tissue) Exposed: Yes Necrotic Quality: Eschar, Adherent Slough Tendon Exposed: No Muscle Exposed: No Joint Exposed: No Bone Exposed: No Periwound Skin Texture Texture Color No Abnormalities Noted: Yes No Abnormalities Noted: Yes Moisture Temperature / Pain No Abnormalities Noted: Yes Temperature: No Abnormality Treatment Notes Wound #3 (Hand - Dorsum) Wound Laterality: Left Cleanser Soap and Water Discharge Instruction: May shower and wash wound with dial antibacterial soap and water prior to dressing change. Wound Cleanser RESSIE, DIAMOND Black ([id number]) [id number] Page 9 of 15 Discharge  Instruction: Cleanse the wound with wound cleanser prior to applying a clean dressing using gauze sponges, not tissue or cotton balls. Peri-Wound Care Topical Mupirocin Ointment Discharge Instruction: Apply Mupirocin (Bactroban) as instructed Primary Dressing Maxorb Extra Ag+ Alginate Dressing, 2x2 (in/in) Discharge Instruction: Apply to wound bed as instructed Secondary Dressing T Non-Adherent Dressing, 3x4 in elfa Discharge Instruction: Apply over primary dressing as directed. Secured With 31M Medipore H Soft Cloth Surgical T ape, 4 x 10 (in/yd) Discharge Instruction: Secure with tape as directed. Compression Wrap Compression Stockings Add-Ons Electronic Signature(s) Signed: 10/20/2022 11:40:21 AM By: [name] Entered By: [name] on 10/20/2022 10:59:16 -------------------------------------------------------------------------------- Wound Assessment Details Patient Name: Date of Service: [name], MA [name]. 10/20/2022 10:30 A M Medical Record Number: [id number] Patient Account Number: [id number] Date of Birth/Sex: Treating RN: 2000-04-04 (21 y.o. [name] Primary Care Ranika Mcniel: [name] Other Clinician: Referring Anh Mangano: Treating Kambry Takacs/Extender: [name] in Treatment: 21 Wound Status Wound Number: 4 Primary  Etiology: Abrasion Wound Location: Left Forearm Wound Status: Open Wounding Event: Skin Tear/Laceration Comorbid History: Type II Diabetes Date Acquired: 09/08/2022 Weeks Of Treatment: 4 Clustered Wound: No Wound Measurements Length: (cm) Width: (cm) Depth: (cm) Area: (cm) Volume: (cm) 0 % Reduction in Area: 100% 0 % Reduction in Volume: 100% 0 Epithelialization: Large (67-100%) 0 Tunneling: No 0 Undermining: No Wound Description Classification: Full Thickness Without Exposed Support Structures Wound Margin: Distinct, outline attached Exudate Amount: None Present Foul Odor After Cleansing:  No Slough/Fibrino No Wound Bed Granulation Amount: None Present (0%) Exposed Structure Necrotic Amount: None Present (0%) Fascia Exposed: No Fat Layer (Subcutaneous Tissue) Exposed: No Tendon Exposed: No Muscle Exposed: No Joint Exposed: No Bone Exposed: No [name], [name] ([id number]) [id number] Page 10 of 15 Periwound Skin Texture Texture Color No Abnormalities Noted: Yes No Abnormalities Noted: Yes Moisture Temperature / Pain No Abnormalities Noted: Yes Temperature: No Abnormality Electronic Signature(s) Signed: 10/20/2022 11:40:21 AM By: [name] Entered By: [name] on 10/20/2022 10:59:29 -------------------------------------------------------------------------------- Wound Assessment Details Patient Name: Date of Service: [name], MA [name]. 10/20/2022 10:30 A M Medical Record Number: [id number] Patient Account Number: [id number] Date of Birth/Sex: Treating RN: Aug 12, 2000 (21 y.o. [name] Primary Care Franke Menter: [name] Other Clinician: Referring Kamoni Depree: Treating Harold Moncus/Extender: [name] in Treatment: 21 Wound Status Wound Number: 5 Primary Etiology: Abrasion Wound Location: Left Shoulder Wound Status: Open Wounding Event: Skin Tear/Laceration Comorbid History: Type II Diabetes Date Acquired: 09/08/2022 Weeks Of Treatment: 4 Clustered Wound: No Wound Measurements Length: (cm) Width: (cm) Depth: (cm) Area: (cm) Volume: (cm) 0 % Reduction in Area: 100% 0 % Reduction in Volume: 100% 0 Epithelialization: Large (67-100%) 0 Tunneling: No 0 Undermining: No Wound Description Classification: Full Thickness Without Exposed Suppor Wound Margin: Distinct, outline attached Exudate Amount: None Present t Structures Foul Odor After Cleansing: No Slough/Fibrino No Wound Bed Granulation Amount: None Present (0%) Exposed Structure Necrotic Amount: None Present  (0%) Fascia Exposed: No Fat Layer (Subcutaneous Tissue) Exposed: No Tendon Exposed: No Muscle Exposed: No Joint Exposed: No Bone Exposed: No Periwound Skin Texture Texture Color No Abnormalities Noted: Yes No Abnormalities Noted: Yes Moisture Temperature / Pain No Abnormalities Noted: No Temperature: No Abnormality Dry / Scaly: Yes Electronic Signature(s) Signed: 10/20/2022 11:40:21 AM By: [name] Entered By: [name] on 10/20/2022 10:59:43 [name], [name] ([id number]) [id number] Page 11 of 15 -------------------------------------------------------------------------------- Wound Assessment Details Patient Name: Date of Service: DO [name], [place]. 10/20/2022 10:30 A M Medical Record Number: [id number] Patient Account Number: [id number] Date of Birth/Sex: Treating RN: [birth year] (21 y.o. [name] Primary Care Adreona Brand: [name] Other Clinician: Referring Faustina Gebert: Treating Yamil Oelke/Extender: [name] in Treatment: 21 Wound Status Wound Number: 6 Primary Etiology: Abrasion Wound Location: Left, Anterior Lower Leg Wound Status: Open Wounding Event: Skin Tear/Laceration Comorbid History: Type II Diabetes Date Acquired: 09/08/2022 Weeks Of Treatment: 4 Clustered Wound: No Wound Measurements Length: (cm) 0.4 Width: (cm) 0.2 Depth: (cm) 0.1 Area: (cm) 0.063 Volume: (cm) 0.006 % Reduction in Area: 77.1% % Reduction in Volume: 77.8% Epithelialization: Small (1-33%) Tunneling: No Undermining: No Wound Description Classification: Full Thickness Without Exposed Support Structures Wound Margin: Distinct, outline attached Exudate Amount: Medium Exudate Type: Serous Exudate Color: amber Foul Odor After Cleansing: No Slough/Fibrino Yes Wound Bed Granulation Amount: Large (67-100%) Exposed Structure Granulation Quality: Red Fascia Exposed: No Necrotic Amount: Small (1-33%) Fat  Layer (Subcutaneous Tissue) Exposed: Yes Necrotic Quality: Eschar, Adherent  Slough Tendon Exposed: No Muscle Exposed: No Joint Exposed: No Bone Exposed: No Periwound Skin Texture Texture Color No Abnormalities Noted: No No Abnormalities Noted: Yes Scarring: Yes Temperature / Pain Temperature: No Abnormality Moisture No Abnormalities Noted: Yes Treatment Notes Wound #6 (Lower Leg) Wound Laterality: Left, Anterior Cleanser Soap and Water Discharge Instruction: May shower and wash wound with dial antibacterial soap and water prior to dressing change. Wound Cleanser Discharge Instruction: Cleanse the wound with wound cleanser prior to applying a clean dressing using gauze sponges, not tissue or cotton balls. Peri-Wound Care Topical Mupirocin Ointment Discharge Instruction: Apply Mupirocin (Bactroban) as instructed Primary Dressing Maxorb Extra Ag+ Alginate Dressing, 2x2 (in/in) Discharge Instruction: Apply to wound bed as instructed [name], [name] ([id number]) [id number] Page 12 of 15 Secondary Dressing T Non-Adherent Dressing, 3x4 in elfa Discharge Instruction: Apply over primary dressing as directed. Secured With 59M Medipore H Soft Cloth Surgical T ape, 4 x 10 (in/yd) Discharge Instruction: Secure with tape as directed. Compression Wrap Compression Stockings Add-Ons Electronic Signature(s) Signed: 10/20/2022 11:40:21 AM By: [name] Entered By: [name] on 10/20/2022 10:59:56 -------------------------------------------------------------------------------- Wound Assessment Details Patient Name: Date of Service: [name], MA [name]. 10/20/2022 10:30 A M Medical Record Number: [id number] Patient Account Number: [id number] Date of Birth/Sex: Treating RN: 22-Oct-2000 (21 y.o. [name] Primary Care Yeilyn Gent: [name] Other Clinician: Referring Kalyn Dimattia: Treating Carvin Almas/Extender: [name] in Treatment: 21 Wound Status Wound Number: 7 Primary Etiology: Abrasion Wound Location: Right, Anterior Lower Leg Wound Status: Open Wounding Event: Skin Tear/Laceration Comorbid History: Type II Diabetes Date Acquired: 09/25/2022 Weeks Of Treatment: 0 Clustered Wound: No Wound Measurements Length: (cm) 8 Width: (cm) 4.5 Depth: (cm) 0.1 Area: (cm) 28.274 Volume: (cm) 2.827 % Reduction in Area: % Reduction in Volume: Epithelialization: Small (1-33%) Tunneling: No Undermining: No Wound Description Classification: Full Thickness Without Exposed Support Structures Wound Margin: Distinct, outline attached Exudate Amount: Medium Exudate Type: Serosanguineous Exudate Color: red, brown Foul Odor After Cleansing: No Slough/Fibrino Yes Wound Bed Granulation Amount: Small (1-33%) Exposed Structure Granulation Quality: Red Fascia Exposed: No Necrotic Amount: Large (67-100%) Fat Layer (Subcutaneous Tissue) Exposed: Yes Necrotic Quality: Eschar, Adherent Slough Tendon Exposed: No Muscle Exposed: No Joint Exposed: No Bone Exposed: No Periwound Skin Texture Texture Color No Abnormalities Noted: Yes No Abnormalities Noted: No Rubor: Yes Moisture No Abnormalities Noted: Yes Temperature / Pain Temperature: No Abnormality [name], [name] ([id number]) [id number] Page 13 of 15 Treatment Notes Wound #7 (Lower Leg) Wound Laterality: Right, Anterior Cleanser Soap and Water Discharge Instruction: May shower and wash wound with dial antibacterial soap and water prior to dressing change. Wound Cleanser Discharge Instruction: Cleanse the wound with wound cleanser prior to applying a clean dressing using gauze sponges, not tissue or cotton balls. Peri-Wound Care Topical Mupirocin Ointment Discharge Instruction: Apply Mupirocin (Bactroban) as instructed Primary Dressing Maxorb Extra Ag+ Alginate Dressing, 2x2 (in/in) Discharge Instruction: Apply  to wound bed as instructed Secondary Dressing T Non-Adherent Dressing, 3x4 in elfa Discharge Instruction: Apply over primary dressing as directed. Secured With 59M Medipore H Soft Cloth Surgical T ape, 4 x 10 (in/yd) Discharge Instruction: Secure with tape as directed. Compression Wrap Compression Stockings Add-Ons Electronic Signature(s) Signed: 10/20/2022 11:40:21 AM By: [name] Entered By: [name] on 10/20/2022 10:48:21 -------------------------------------------------------------------------------- Wound Assessment Details Patient Name: Date of Service: [name], MA [name]. 10/20/2022 10:30 A M Medical Record Number: [id number] Patient Account Number: [id number] Date of Birth/Sex: Treating RN:  2001-02-22 (21 y.o. [name] Primary Care Toryn Mcclinton: [name] Other Clinician: Referring Bray Vickerman: Treating Azyria Osmon/Extender: [name] in Treatment: 21 Wound Status Wound Number: 8 Primary Etiology: Abrasion Wound Location: Right, Medial Lower Leg Wound Status: Open Wounding Event: Skin Tear/Laceration Comorbid History: Type II Diabetes Date Acquired: 09/25/2022 Weeks Of Treatment: 0 Clustered Wound: No Wound Measurements Length: (cm) 2 Width: (cm) 1.5 Depth: (cm) 0.1 Area: (cm) 2.356 Volume: (cm) 0.236 % Reduction in Area: % Reduction in Volume: Epithelialization: Small (1-33%) Tunneling: No Undermining: No Wound Description Classification: Full Thickness Without Exposed Support Structures Wound Margin: Distinct, outline attached Exudate Amount: Medium Exudate Type: Serosanguineous [name], [name] ([id number]) Exudate Color: red, brown Foul Odor After Cleansing: No Slough/Fibrino Yes [id number] Page 14 of 15 Wound Bed Granulation Amount: Small (1-33%) Exposed Structure Granulation Quality: Red Fascia Exposed: No Necrotic Amount: Large (67-100%) Fat Layer (Subcutaneous  Tissue) Exposed: Yes Necrotic Quality: Eschar, Adherent Slough Tendon Exposed: No Muscle Exposed: No Joint Exposed: No Bone Exposed: No Periwound Skin Texture Texture Color No Abnormalities Noted: No No Abnormalities Noted: Yes Scarring: Yes Temperature / Pain Temperature: No Abnormality Moisture No Abnormalities Noted: Yes Treatment Notes Wound #8 (Lower Leg) Wound Laterality: Right, Medial Cleanser Soap and Water Discharge Instruction: May shower and wash wound with dial antibacterial soap and water prior to dressing change. Wound Cleanser Discharge Instruction: Cleanse the wound with wound cleanser prior to applying a clean dressing using gauze sponges, not tissue or cotton balls. Peri-Wound Care Topical Mupirocin Ointment Discharge Instruction: Apply Mupirocin (Bactroban) as instructed Primary Dressing Maxorb Extra Ag+ Alginate Dressing, 2x2 (in/in) Discharge Instruction: Apply to wound bed as instructed Secondary Dressing T Non-Adherent Dressing, 3x4 in elfa Discharge Instruction: Apply over primary dressing as directed. Secured With 52M Medipore H Soft Cloth Surgical T ape, 4 x 10 (in/yd) Discharge Instruction: Secure with tape as directed. Compression Wrap Compression Stockings Add-Ons Electronic Signature(s) Signed: 10/20/2022 11:40:21 AM By: [name] Entered By: [name] on 10/20/2022 10:49:20 -------------------------------------------------------------------------------- Vitals Details Patient Name: Date of Service: [name], MA [name]. 10/20/2022 10:30 A M Medical Record Number: [id number] Patient Account Number: [id number] Date of Birth/Sex: Treating RN: May 18, 2000 (21 y.o. [name] Primary Care Tariq Pernell: [name] Other Clinician: Referring Surafel Hilleary: Treating Makhai Fulco/Extender: [name] in Treatment: [address] [name], [name] ([id number]) [id number] Page 15  of 15 Time Taken: 10:42 Temperature (F): 97.7 Height (in): 60 Pulse (bpm): 102 Weight (lbs): 380 Respiratory Rate (breaths/min): 16 Body Mass Index (BMI): 74.2 Blood Pressure (mmHg): 172/95 Reference Range: 80 - 120 mg / dl Electronic Signature(s) Signed: 10/20/2022 11:40:21 AM By: [name] Entered By: [name] on 10/20/2022 10:43:42

## 2022-10-23 ENCOUNTER — Other Ambulatory Visit (HOSPITAL_COMMUNITY): Payer: Self-pay

## 2022-10-27 LAB — HM DIABETES EYE EXAM

## 2022-11-17 ENCOUNTER — Encounter (HOSPITAL_BASED_OUTPATIENT_CLINIC_OR_DEPARTMENT_OTHER): Payer: 59 | Admitting: General Surgery

## 2022-11-20 ENCOUNTER — Encounter (HOSPITAL_BASED_OUTPATIENT_CLINIC_OR_DEPARTMENT_OTHER): Payer: 59 | Attending: General Surgery | Admitting: General Surgery

## 2022-11-20 DIAGNOSIS — L98492 Non-pressure chronic ulcer of skin 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: Secondary | ICD-10-CM | POA: Insufficient documentation

## 2022-11-20 DIAGNOSIS — L97812 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osed: Secondary | ICD-10-CM | POA: Diagnosis not present

## 2022-11-20 DIAGNOSIS — Q8711 Prader-Willi syndrome: Secondary | ICD-10-CM | POA: Diagnosis not present

## 2022-11-20 DIAGNOSIS — E11622 Type 2 diabetes mellitus with other skin ulcer: Secondary | ICD-10-CM | POA: Diagnosis present

## 2022-11-20 DIAGNOSIS — I10 Essential (primary) hypertension: Secondary | ICD-10-CM | POA: Diagnosis not present

## 2022-11-20 DIAGNOSIS — F424 Excoriation (skin-picking) disorder: Secondary | ICD-10-CM | POA: Insufficient documentation

## 2022-11-20 NOTE — Progress Notes (Signed)
[name], [name] ([id number]) [id number] Page 1 of 14 Visit Report for 11/20/2022 Chief Complaint Document Details Patient Name: Date of Service: DO [name], [place] 11/20/2022 10:45 A M Medical Record Number: [id number] Patient Account Number: [id number] Date of Birth/Sex: Treating RN: 08-18-2000 (21 y.o. F) Primary Care Provider: Enid Skeens Other Clinician: Referring Provider: Treating Provider/Extender: Arther Dames in Treatment: 25 Information Obtained from: Patient Chief Complaint Patient seen for complaints of Non-Healing Wound. Electronic Signature(s) Signed: 11/20/2022 11:00:21 AM By: Duanne Guess MD FACS Entered By: Duanne Guess on 11/20/2022 08:00:21 -------------------------------------------------------------------------------- Debridement Details Patient Name: Date of Service: DO [name], MA [name]. 11/20/2022 10:45 A M Medical Record Number: [id number] Patient Account Number: [id number] Date of Birth/Sex: Treating RN: [birth year] (21 y.o. [name] Primary Care Provider: Enid Skeens Other Clinician: Referring Provider: Treating Provider/Extender: Arther Dames in Treatment: 25 Debridement Performed for Assessment: Wound #3 Left Hand - Dorsum Performed By: Physician Duanne Guess, MD Debridement Type: Debridement Level of Consciousness (Pre-procedure): Awake and Alert Pre-procedure Verification/Time Out Yes - 10:48 Taken: Start Time: 10:48 Pain Control: Lidocaine 4% Topical Solution Percent of Wound Bed Debrided: 100% T Area Debrided (cm): otal 1.57 Tissue and other material debrided: Non-Viable, Eschar, Slough, Slough Level: Non-Viable Tissue Debridement Description: Selective/Open Wound Instrument: Curette Bleeding: Minimum Hemostasis Achieved: Pressure Response to Treatment: Procedure was tolerated well Level of Consciousness (Post- Awake and  Alert procedure): Post Debridement Measurements of Total Wound Length: (cm) 2 Width: (cm) 1 Depth: (cm) 0.1 Volume: (cm) 0.157 Character of Wound/Ulcer Post Debridement: Improved Post Procedure Diagnosis Same as [name], [name] ([id number]) [id number] Page 2 of 14 Electronic Signature(s) Signed: 11/20/2022 11:49:36 AM By: Duanne Guess MD FACS Signed: 11/20/2022 3:03:34 PM By: Gelene Mink By: Samuella Bruin on 11/20/2022 07:48:34 -------------------------------------------------------------------------------- Debridement Details Patient Name: Date of Service: DO [name], MA [name]. 11/20/2022 10:45 A M Medical Record Number: [id number] Patient Account Number: [id number] Date of Birth/Sex: Treating RN: 2001-02-25 (21 y.o. [name], [name] Primary Care Provider: Enid Skeens Other Clinician: Referring Provider: Treating Provider/Extender: Arther Dames in Treatment: 25 Debridement Performed for Assessment: Wound #10 Left Abdomen - Lower Quadrant Performed By: Physician Duanne Guess, MD Debridement Type: Debridement Level of Consciousness (Pre-procedure): Awake and Alert Pre-procedure Verification/Time Out Yes - 10:48 Taken: Start Time: 10:48 Pain Control: Lidocaine 4% Topical Solution Percent of Wound Bed Debrided: 100% T Area Debrided (cm): otal 0.16 Tissue and other material debrided: Non-Viable, Eschar Level: Non-Viable Tissue Debridement Description: Selective/Open Wound Instrument: Curette Bleeding: Minimum Hemostasis Achieved: Pressure Response to Treatment: Procedure was tolerated well Level of Consciousness (Post- Awake and Alert procedure): Post Debridement Measurements of Total Wound Length: (cm) 0.7 Width: (cm) 0.3 Depth: (cm) 0.1 Volume: (cm) 0.016 Character of Wound/Ulcer Post Debridement: Improved Post Procedure Diagnosis Same as Pre-procedure Electronic  Signature(s) Signed: 11/20/2022 11:49:36 AM By: Duanne Guess MD FACS Signed: 11/20/2022 3:03:34 PM By: Samuella Bruin Entered By: Samuella Bruin on 11/20/2022 07:48:59 -------------------------------------------------------------------------------- Debridement Details Patient Name: Date of Service: DO [name], MA [name]. 11/20/2022 10:45 A M Medical Record Number: [id number] Patient Account Number: [id number] Date of Birth/Sex: Treating RN: [birth year] (21 y.o. [name] Primary Care Provider: Enid Skeens Other Clinician: Referring Provider: Treating Provider/Extender: Arther Dames in Treatment: 25 Debridement Performed for Assessment: Wound #8 Right,Medial Lower Leg Performed By: Physician Duanne Guess, MD [name], [name] ([id number]) [id number] Page 3 of 14 Debridement  Type: Debridement Level of Consciousness (Pre-procedure): Awake and Alert Pre-procedure Verification/Time Out Yes - 10:48 Taken: Start Time: 10:48 Pain Control: Lidocaine 4% Topical Solution Percent of Wound Bed Debrided: 100% T Area Debrided (cm): otal 0.24 Tissue and other material debrided: Non-Viable, Eschar, Slough, Slough Level: Non-Viable Tissue Debridement Description: Selective/Open Wound Instrument: Curette Bleeding: Minimum Hemostasis Achieved: Pressure Response to Treatment: Procedure was tolerated well Level of Consciousness (Post- Awake and Alert procedure): Post Debridement Measurements of Total Wound Length: (cm) 0.3 Width: (cm) 1 Depth: (cm) 0.1 Volume: (cm) 0.024 Character of Wound/Ulcer Post Debridement: Improved Post Procedure Diagnosis Same as Pre-procedure Electronic Signature(s) Signed: 11/20/2022 11:49:36 AM By: Duanne Guess MD FACS Signed: 11/20/2022 3:03:34 PM By: Samuella Bruin Entered By: Samuella Bruin on 11/20/2022  07:49:20 -------------------------------------------------------------------------------- Debridement Details Patient Name: Date of Service: DO [name], MA [name]. 11/20/2022 10:45 A M Medical Record Number: [id number] Patient Account Number: [id number] Date of Birth/Sex: Treating RN: June 08, 2000 (21 y.o. [name] Primary Care Provider: Enid Skeens Other Clinician: Referring Provider: Treating Provider/Extender: Arther Dames in Treatment: 25 Debridement Performed for Assessment: Wound #7 Right,Anterior Lower Leg Performed By: Physician Duanne Guess, MD Debridement Type: Debridement Level of Consciousness (Pre-procedure): Awake and Alert Pre-procedure Verification/Time Out Yes - 10:48 Taken: Start Time: 10:48 Pain Control: Lidocaine 4% T opical Solution Percent of Wound Bed Debrided: 100% T Area Debrided (cm): otal 7.46 Tissue and other material debrided: Non-Viable, Slough, Slough Level: Non-Viable Tissue Debridement Description: Selective/Open Wound Instrument: Curette Bleeding: Minimum Hemostasis Achieved: Pressure Response to Treatment: Procedure was tolerated well Level of Consciousness (Post- Awake and Alert procedure): Post Debridement Measurements of Total Wound Length: (cm) 2.5 Width: (cm) 3.8 Depth: (cm) 0.1 Volume: (cm) 0.746 Character of Wound/Ulcer Post Debridement: Improved [name], [name] ([id number]) [id number] Page 4 of 14 Post Procedure Diagnosis Same as Pre-procedure Electronic Signature(s) Signed: 11/20/2022 11:49:36 AM By: Duanne Guess MD FACS Signed: 11/20/2022 3:03:34 PM By: Samuella Bruin Entered By: Samuella Bruin on 11/20/2022 07:50:10 -------------------------------------------------------------------------------- Debridement Details Patient Name: Date of Service: DO [name], MA [name]. 11/20/2022 10:45 A M Medical Record Number: [id number] Patient Account Number:  [id number] Date of Birth/Sex: Treating RN: Dec 27, 2000 (21 y.o. [name] Primary Care Provider: Enid Skeens Other Clinician: Referring Provider: Treating Provider/Extender: Arther Dames in Treatment: 25 Debridement Performed for Assessment: Wound #9 Right Hand - Dorsum Performed By: Physician Duanne Guess, MD Debridement Type: Debridement Level of Consciousness (Pre-procedure): Awake and Alert Pre-procedure Verification/Time Out Yes - 10:48 Taken: Start Time: 10:48 Pain Control: Lidocaine 4% T opical Solution Percent of Wound Bed Debrided: 100% T Area Debrided (cm): otal 1.07 Tissue and other material debrided: Non-Viable, Slough, Slough Level: Non-Viable Tissue Debridement Description: Selective/Open Wound Instrument: Curette Bleeding: Minimum Hemostasis Achieved: Pressure Response to Treatment: Procedure was tolerated well Level of Consciousness (Post- Awake and Alert procedure): Post Debridement Measurements of Total Wound Length: (cm) 1.7 Width: (cm) 0.8 Depth: (cm) 0.1 Volume: (cm) 0.107 Character of Wound/Ulcer Post Debridement: Improved Post Procedure Diagnosis Same as Pre-procedure Electronic Signature(s) Signed: 11/20/2022 11:49:36 AM By: Duanne Guess MD FACS Signed: 11/20/2022 3:03:34 PM By: Samuella Bruin Entered By: Samuella Bruin on 11/20/2022 07:50:42 -------------------------------------------------------------------------------- HPI Details Patient Name: Date of Service: DO [name], MA [name]. 11/20/2022 10:45 A M Medical Record Number: [id number] Patient Account Number: [id number] Date of Birth/Sex: Treating RN: [birth year] (21 y.o. F) Primary Care Provider: Enid Skeens Other Clinician: Referring Provider: Treating Provider/Extender: Michaelle Birks,  Silvie Black ([id number]) [id number] Page 5 of 14 Weeks in Treatment: 25 History of Present Illness HPI  Description: ADMISSION 05/23/2022 This is a 22 year old young woman with Prader-Willi syndrome and the usual accompanying super morbid obesity, diabetes, and skin picking disorder. She picked a large wound in her right thigh that resulted in an emergency department visit on 17 February. Apparently her father was cleaning the wound with mupirocin and hydrogen peroxide. A culture was taken that grew out Proteus and methicillin sensitive Staph aureus. She was prescribed levofloxacin. She saw her primary care provider a few days later and based on the depth and appearance of the wound, she was referred to the wound care center for further evaluation and management. There is a large area on the patient's upper right thigh that is excoriated and scaly with dry skin that has obviously been picked at. The primary wound is 4-1/2 cm in depth. It is much cleaner than described in the ED notes. There is also a linear ulcer adjacent to this deep site with slough accumulation. 3/15; patient presents for follow-up. She has been using iodoform packing followed by silver alginate to the wound bed. Mother is present during the encounter. There is been improvement in wound healing. 06/23/2022: The depth of the wound has come in further. The periwound skin is in improved condition with very few open areas. 07/21/2022: The wound continues to contract. Her periwound skin has completely healed. 08/18/2022: The wound measured a couple of millimeters deeper today. No concern for infection. 09/22/2022: Unfortunately, over the past month, [name] has engaged in significant skin picking activity. She has wounds on each of her wrists, 1 on her right dorsal hand, 1 on her left shoulder, and 1 on her left anterior tibial surface. The initial wound on her right thigh is a little bit shallower. No concern for infection at any of the sites. 10/20/2022: The right wrist wound, the left wrist wound, the left shoulder wound, and the right  thigh wound are healed. She has picked new wounds on her right lower leg and the left dorsal hand. There is slough and eschar on all of the wound surfaces. 11/20/2022: The left knee wound is healed. She has reopened the wounds on her right hand and left wrist. The shoulder remains healed. She also has a new wound on her abdomen. Electronic Signature(s) Signed: 11/20/2022 11:14:53 AM By: Duanne Guess MD FACS Previous Signature: 11/20/2022 11:05:18 AM Version By: Duanne Guess MD FACS Entered By: Duanne Guess on 11/20/2022 08:14:53 -------------------------------------------------------------------------------- Physical Exam Details Patient Name: Date of Service: DO [name], MA [name]. 11/20/2022 10:45 A M Medical Record Number: [id number] Patient Account Number: [id number] Date of Birth/Sex: Treating RN: 2000/10/18 (21 y.o. F) Primary Care Provider: Enid Skeens Other Clinician: Referring Provider: Treating Provider/Extender: Arther Dames in Treatment: 25 Constitutional Hypertensive, asymptomatic. . . . no acute distress. Respiratory Normal work of breathing on room air. Notes 11/20/2022: The left knee wound is healed. She has reopened the wounds on her right hand and left wrist. The shoulder remains healed. She also has a new wound on her abdomen. Electronic Signature(s) Signed: 11/20/2022 11:15:28 AM By: Duanne Guess MD FACS Entered By: Duanne Guess on 11/20/2022 08:15:27 [place], [name] ([id number]) [id number] Page 6 of 14 -------------------------------------------------------------------------------- Physician Orders Details Patient Name: Date of Service: DO [name], [place] 11/20/2022 10:45 A M Medical Record Number: [id number] Patient Account Number: [id number] Date of Birth/Sex: Treating RN: Nov 03, 2000 (21 y.o. 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 - Dr. Lady Gary - room 2 Anesthetic (In clinic) Topical Lidocaine 4% applied to wound bed Bathing/ Shower/ Hygiene May shower and wash wound with soap and water. Wound Treatment Wound #10 - Abdomen - Lower Quadrant Wound Laterality: Left Cleanser: Soap and Water 1 x Per Day/30 Days Discharge Instructions: May shower and wash wound with dial antibacterial soap and water prior to dressing change. Cleanser: Wound Cleanser 1 x Per Day/30 Days Discharge Instructions: Cleanse the wound with wound cleanser prior to applying a clean dressing using gauze sponges, not tissue or cotton balls. Topical: Mupirocin Ointment 1 x Per Day/30 Days Discharge Instructions: Apply Mupirocin (Bactroban) as instructed Prim Dressing: Maxorb Extra Ag+ Alginate Dressing, 2x2 (in/in) (Generic) 1 x Per Day/30 Days ary Discharge Instructions: Apply to wound bed as instructed Secondary Dressing: T Non-Adherent Dressing, 3x4 in 1 x Per Day/30 Days elfa Discharge Instructions: Apply over primary dressing as directed. Secondary Dressing: Woven Gauze Sponge, Non-Sterile 4x4 in 1 x Per Day/30 Days Discharge Instructions: Apply over primary dressing as directed. Secured With: 65M Medipore H Soft Cloth Surgical T ape, 4 x 10 (in/yd) 1 x Per Day/30 Days Discharge Instructions: Secure with tape as directed. Wound #3 - Hand - Dorsum Wound Laterality: Left Cleanser: Soap  and Water 1 x Per Day/30 Days Discharge Instructions: May shower and wash wound with dial antibacterial soap and water prior to dressing change. Cleanser: Wound Cleanser 1 x Per Day/30 Days Discharge Instructions: Cleanse the wound with wound cleanser prior to applying a clean dressing using gauze sponges, not tissue or cotton balls. Topical: Mupirocin Ointment 1 x Per Day/30 Days Discharge Instructions: Apply Mupirocin (Bactroban) as instructed Prim Dressing: Maxorb Extra Ag+ Alginate Dressing, 2x2 (in/in) (Generic) 1 x Per Day/30 Days ary Discharge Instructions: Apply to wound bed as instructed Secondary Dressing: T Non-Adherent Dressing, 3x4 in 1 x Per Day/30 Days elfa Discharge Instructions: Apply over primary dressing as directed. Secondary Dressing: Woven Gauze Sponge, Non-Sterile 4x4 in 1 x Per Day/30 Days Discharge Instructions: Apply over primary dressing as directed. Secured With: 65M Medipore H Soft Cloth Surgical T ape, 4 x 10 (in/yd) 1 x Per Day/30 Days Discharge Instructions: Secure with tape as directed. [name], [name] ([id number]) [id number] Page 7 of 14 Wound #7 - Lower Leg Wound Laterality: Right, Anterior Cleanser: Soap and Water 1 x Per Day/30 Days Discharge Instructions: May shower and wash wound with dial antibacterial soap and water prior to dressing change. Cleanser: Wound Cleanser 1 x Per Day/30 Days Discharge Instructions: Cleanse the wound with wound cleanser prior to applying a clean dressing using gauze sponges, not tissue or cotton balls. Topical: Mupirocin Ointment 1 x Per Day/30 Days Discharge Instructions: Apply Mupirocin (Bactroban) as instructed Prim Dressing: Maxorb Extra Ag+ Alginate Dressing, 2x2 (in/in) (Generic) 1 x Per Day/30 Days ary Discharge Instructions: Apply to wound bed as instructed Secondary Dressing: T Non-Adherent Dressing, 3x4 in 1 x Per Day/30 Days elfa Discharge Instructions: Apply over primary dressing  as directed. Secondary Dressing: Woven Gauze Sponge, Non-Sterile 4x4 in 1 x Per Day/30 Days Discharge Instructions: Apply over primary dressing as directed. Secured With: 54M Medipore H Soft Cloth Surgical T ape, 4 x 10 (in/yd) 1 x Per Day/30 Days Discharge Instructions: Secure with tape as directed. Wound #8 - Lower Leg Wound Laterality: Right, Medial Cleanser: Soap and Water 1 x Per Day/30 Days Discharge Instructions: May shower and wash wound with dial antibacterial soap and water prior to dressing change. Cleanser: Wound Cleanser 1 x Per Day/30 Days Discharge Instructions: Cleanse the wound with wound cleanser prior to applying a clean dressing using gauze sponges, not tissue or cotton balls. Topical: Mupirocin Ointment 1 x Per Day/30 Days Discharge Instructions: Apply Mupirocin (Bactroban) as instructed Prim Dressing: Maxorb Extra Ag+ Alginate Dressing, 2x2 (in/in) (Generic) 1 x Per Day/30 Days ary Discharge Instructions: Apply to wound bed as instructed Secondary Dressing: T Non-Adherent Dressing, 3x4 in 1 x Per Day/30 Days elfa Discharge Instructions: Apply over primary dressing as directed. Secondary Dressing: Woven Gauze Sponge, Non-Sterile 4x4 in 1 x Per Day/30 Days Discharge Instructions: Apply over primary dressing as directed. Secured With: 54M Medipore H Soft Cloth Surgical T ape, 4 x 10 (in/yd) 1 x Per Day/30 Days Discharge Instructions: Secure with tape as directed. Wound #9 - Hand - Dorsum Wound Laterality: Right Cleanser: Soap and Water 1 x Per Day/30 Days Discharge Instructions: May shower and wash wound with dial antibacterial soap and water prior to dressing change. Cleanser: Wound Cleanser 1 x Per Day/30 Days Discharge Instructions: Cleanse the wound with wound cleanser prior to applying a clean dressing using gauze sponges, not tissue or cotton balls. Topical: Mupirocin Ointment 1 x Per Day/30 Days Discharge Instructions: Apply Mupirocin (Bactroban) as  instructed Prim Dressing: Maxorb Extra Ag+ Alginate Dressing, 2x2 (in/in) (Generic) 1 x Per Day/30 Days ary Discharge Instructions: Apply to wound bed as instructed Secondary Dressing: T Non-Adherent Dressing, 3x4 in 1 x Per Day/30 Days elfa Discharge Instructions: Apply over primary dressing as directed. Secondary Dressing: Woven Gauze Sponge, Non-Sterile 4x4 in 1 x Per Day/30 Days Discharge Instructions: Apply over primary dressing as directed. Secured With: 54M Medipore H Soft Cloth Surgical T ape, 4 x 10 (in/yd) 1 x Per Day/30 Days Discharge Instructions: Secure with tape as directed. Patient Medications llergies: gloves, latex A Notifications Medication Indication Start End 11/20/2022 lidocaine DOSE topical 4 % cream - cream topical NEESA, KEITH Black ([id number]) [id number] Page 8 of 14 Electronic Signature(s) Signed: 11/20/2022 11:49:36 AM By: Duanne Guess MD FACS Entered By: Duanne Guess on 11/20/2022 08:15:42 -------------------------------------------------------------------------------- Problem List Details Patient Name: Date of Service: DO [name], MA [name]. 11/20/2022 10:45 A M Medical Record Number: [id number] Patient Account Number: [id number] Date of Birth/Sex: Treating RN: [birth year] (21 y.o. 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 05/23/2022 No Yes Q87.11 Prader-Willi syndrome 05/23/2022 No Yes Z68.45 Body mass index [BMI] 70 or greater, adult 05/23/2022 No Yes E11.65 Type 2 diabetes mellitus  with hyperglycemia 05/23/2022 No Yes Inactive Problems ICD-10 Code Description Active Date Inactive Date L97.822 Non-pressure chronic ulcer of other part of left lower leg with fat  layer exposed 09/22/2022 09/22/2022 Resolved Problems ICD-10 Code Description Active Date Resolved Date L97.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 05/23/2022 05/23/2022 Electronic Signature(s) Signed: 11/20/2022 10:59:44 AM By: Duanne Guess MD FACS Entered By: Duanne Guess on 11/20/2022 07:59:43 [name], [name] ([id number]) [id number] Page 9 of 14 -------------------------------------------------------------------------------- Progress Note Details Patient Name: Date of Service: DO [name], [place] 11/20/2022 10:45 A M Medical Record Number: [id number] Patient Account Number: [id number] Date of Birth/Sex: Treating RN: 04-04-2000 (21 y.o. F) Primary Care Provider: Enid Skeens Other Clinician: Referring Provider: Treating Provider/Extender: Arther Dames in Treatment: 25 Subjective Chief Complaint Information obtained from Patient Patient seen for complaints of Non-Healing Wound. History of Present Illness (HPI) ADMISSION 05/23/2022 This is a 22 year old young woman with Prader-Willi syndrome and the usual accompanying super morbid obesity, diabetes, and skin picking disorder. She picked a large wound in her right thigh that resulted in an emergency department visit on 17 February. Apparently her father was cleaning the wound with mupirocin and hydrogen peroxide. A culture was taken that grew out Proteus and methicillin sensitive Staph aureus. She was prescribed levofloxacin. She saw her primary care provider a few days later and based on the depth and appearance of the wound, she was referred to the wound care center for further evaluation and management. There is a large area on the patient's upper right thigh that is  excoriated and scaly with dry skin that has obviously been picked at. The primary wound is 4-1/2 cm in depth. It is much cleaner than described in the ED notes. There is also a linear ulcer adjacent to this deep site with slough accumulation. 3/15; patient presents for follow-up. She has been using iodoform packing followed by silver alginate to the wound bed. Mother is present during the encounter. There is been improvement in wound healing. 06/23/2022: The depth of the wound has come in further. The periwound skin is in improved condition with very few open areas. 07/21/2022: The wound continues to contract. Her periwound skin has completely healed. 08/18/2022: The wound measured a couple of millimeters deeper today. No concern for infection. 09/22/2022: Unfortunately, over the past month, [name] has engaged in significant skin picking activity. She has wounds on each of her wrists, 1 on her right dorsal hand, 1 on her left shoulder, and 1 on her left anterior tibial surface. The initial wound on her right thigh is a little bit shallower. No concern for infection at any of the sites. 10/20/2022: The right wrist wound, the left wrist wound, the left shoulder wound, and the right thigh wound are healed. She has picked new wounds on her right lower leg and the left dorsal hand. There is slough and eschar on all of the wound surfaces. 11/20/2022: The left knee wound is healed. She has reopened the wounds on her right hand and left wrist. The shoulder remains healed. She also has a new wound on her abdomen. Patient History Information obtained from Caregiver. Family History Hypertension - Father, Thyroid Problems - Father, No family history of Cancer, Diabetes, Heart Disease, Hereditary Spherocytosis, Kidney Disease, Lung Disease, Seizures, Stroke, Tuberculosis. Social History Never smoker, Marital Status - Single, Alcohol Use - Never, Drug Use - No History, Caffeine Use - Never. Medical  History Endocrine Patient has history of Type II Diabetes Hospitalization/Surgery History - knee surgery. - myringotomy. 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 no acute distress. Vitals Time Taken: 10:31 AM, Height: 60 in, Weight: 380 lbs, BMI: 74.2, Temperature: 97.7 F, Pulse: 89 bpm, Respiratory Rate: 18 breaths/min, Blood Pressure: 163/94 mmHg. Respiratory Normal work of breathing on room air. General Notes: 11/20/2022: The left knee wound is healed. She has reopened the wounds on her right hand and left wrist. The shoulder remains healed. She also has a new wound on her abdomen. Integumentary (Hair, Skin) Wound #10 status is Open. Original cause of wound was Skin T ear/Laceration. The date acquired was: 11/20/2022. The wound is located on the Left Abdomen - Lower Quadrant. The wound measures 0.7cm length x 0.3cm width x 0.1cm depth; 0.165cm^2 area and 0.016cm^3 volume. There is Fat Layer (Subcutaneous Tissue) exposed. There is no tunneling or undermining noted. There is a medium amount of serosanguineous drainage noted. The wound margin is distinct with the outline attached to the wound base. There is small (1-33%) red granulation within the wound bed. There is a large (67-100%) amount of necrotic tissue within the wound bed including Eschar and Adherent Slough. The periwound skin appearance had no abnormalities noted for texture. The periwound skin appearance had no abnormalities noted for moisture. The periwound skin appearance had no abnormalities noted for color. Periwound temperature was noted as No Abnormality. Wound #3 status is Open. Original cause of wound was Skin T ear/Laceration. The date acquired  was: 09/08/2022. The wound has been in treatment 8 weeks. The wound is located on the Left Hand - Dorsum. The wound measures 2cm length x 1cm width x 0.1cm depth; 1.571cm^2 area and 0.157cm^3 volume. There is Fat Layer (Subcutaneous Tissue) exposed. There is no tunneling or undermining noted. There is a medium amount of serous drainage noted. The wound margin is distinct with the outline attached to the wound base. There is large (67-100%) red, pink granulation within the wound bed. There is a small (1-33%) amount of necrotic tissue within the wound bed including Eschar and Adherent Slough. The periwound skin appearance had no abnormalities noted for texture. The periwound skin appearance had no abnormalities noted for moisture. The periwound skin appearance had no abnormalities noted for color. Periwound temperature was noted as No Abnormality. Wound #6 status is Healed - Epithelialized. Original cause of wound was Skin Tear/Laceration. The date acquired was: 09/08/2022. The wound has been in treatment 8 weeks. The wound is located on the Left,Anterior Lower Leg. The wound measures 0cm length x 0cm width x 0cm depth; 0cm^2 area and 0cm^3 volume. There is no tunneling or undermining noted. There is a none present amount of drainage noted. The wound margin is distinct with the outline attached to the wound base. There is no granulation within the wound bed. There is no necrotic tissue within the wound bed. The periwound skin appearance had no abnormalities noted for moisture. The periwound skin appearance had no abnormalities noted for color. The periwound skin appearance did not exhibit: Scarring. Periwound temperature was noted as No Abnormality. Wound #7 status is Open. Original cause of wound was Skin T ear/Laceration. The date acquired was: 09/25/2022. The wound has been in treatment 4 weeks. The wound is located on the Right,Anterior Lower Leg. The wound measures 2.5cm length x 3.8cm width x 0.1cm  depth; 7.461cm^2 area and 0.746cm^3 volume. There is Fat Layer (Subcutaneous Tissue) exposed. There is no tunneling or undermining noted. There is a medium amount of serosanguineous drainage noted. The wound  margin is distinct with the outline attached to the wound base. There is large (67-100%) red granulation within the wound bed. There is a small (1-33%) amount of necrotic tissue within the wound bed including Eschar and Adherent Slough. The periwound skin appearance had no abnormalities noted for texture. The periwound skin appearance had no abnormalities noted for moisture. The periwound skin appearance exhibited: Rubor. Periwound temperature was noted as No Abnormality. Wound #8 status is Open. Original cause of wound was Skin T ear/Laceration. The date acquired was: 09/25/2022. The wound has been in treatment 4 weeks. The wound is located on the Right,Medial Lower Leg. The wound measures 0.3cm length x 1cm width x 0.1cm depth; 0.236cm^2 area and 0.024cm^3 volume. There is Fat Layer (Subcutaneous Tissue) exposed. There is no tunneling or undermining noted. There is a medium amount of serosanguineous drainage noted. The wound margin is distinct with the outline attached to the wound base. There is medium (34-66%) red granulation within the wound bed. There is a medium (34- 66%) amount of necrotic tissue within the wound bed including Eschar and Adherent Slough. The periwound skin appearance had no abnormalities noted for moisture. The periwound skin appearance had no abnormalities noted for color. The periwound skin appearance did not exhibit: Scarring. Periwound temperature was noted as No Abnormality. Wound #9 status is Open. Original cause of wound was Skin T ear/Laceration. The date acquired was: 11/16/2022. The wound is located on the Right Hand - Dorsum. The wound measures 1.7cm length x 0.8cm width x 0.1cm depth; 1.068cm^2 area and 0.107cm^3 volume. There is Fat Layer (Subcutaneous  Tissue) exposed. There is no tunneling or undermining noted. There is a medium amount of serosanguineous drainage noted. The wound margin is distinct with the outline attached to the wound base. There is large (67-100%) pink granulation within the wound bed. There is a small (1-33%) amount of necrotic tissue within the wound bed including Adherent Slough. The periwound skin appearance had no abnormalities noted for texture. The periwound skin appearance had no abnormalities noted for moisture. The periwound skin appearance had no abnormalities noted for color. Periwound temperature was noted as No Abnormality. 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 There was a Selective/Open Wound Non-Viable Tissue Debridement with a total area of 0.16 sq cm performed by Duanne Guess, MD. With the following instrument(s): Curette to remove Non-Viable tissue/material. Material removed includes Eschar after achieving pain control using Lidocaine 4% Topical Solution. No specimens were taken. A time out was conducted at 10:48, prior to the start of the procedure. A Minimum amount of bleeding was controlled with Pressure. The procedure was tolerated well. Post Debridement Measurements: 0.7cm length x 0.3cm width x 0.1cm depth; 0.016cm^3 volume. Character of Wound/Ulcer Post Debridement is improved. Post procedure Diagnosis Wound #10: Same as Pre-Procedure Wound #3 Pre-procedure diagnosis of Wound #3 is an Abrasion  located on the Left Hand - Dorsum . There was a Selective/Open Wound Non-Viable Tissue Debridement with a total area of 1.57 sq cm performed by Duanne Guess, MD. With the following instrument(s): Curette to remove Non-Viable tissue/material. Material removed includes  Eschar and Slough and after achieving pain control using Lidocaine 4% T opical Solution. No specimens were taken. A time out was conducted at 10:48, prior to the start of the procedure. A Minimum amount of bleeding was controlled with Pressure. The procedure was tolerated well. Post Debridement Measurements: 2cm length x 1cm width x 0.1cm depth; 0.157cm^3 volume. Character of Wound/Ulcer Post Debridement is improved. Post procedure Diagnosis Wound #3: Same as Pre-Procedure Wound #7 Pre-procedure diagnosis of Wound #7 is an Abrasion located on the Right,Anterior Lower Leg . There was a Selective/Open Wound Non-Viable Tissue Debridement with a total area of 7.46 sq cm performed by Duanne Guess, MD. With the following instrument(s): Curette to remove Non-Viable tissue/material. Material removed includes [facility] after achieving pain control using Lidocaine 4% Topical Solution. No specimens were taken. A time out was conducted at 10:48, prior to the start of the procedure. A Minimum amount of bleeding was controlled with Pressure. The procedure was tolerated well. Post Debridement Measurements: 2.5cm length x 3.8cm width x 0.1cm depth; 0.746cm^3 volume. Character of Wound/Ulcer Post Debridement is improved. Post procedure Diagnosis Wound #7: Same as Pre-Procedure Wound #8 Pre-procedure diagnosis of Wound #8 is an Abrasion located on the Right,Medial Lower Leg . There was a Selective/Open Wound Non-Viable Tissue Debridement with a total area of 0.24 sq cm performed by Duanne Guess, MD. With the following instrument(s): Curette to remove Non-Viable tissue/material. Material removed includes Eschar and Slough and after achieving  pain control using Lidocaine 4% Topical Solution. No specimens were taken. A time out was conducted at 10:48, prior to the start of the procedure. A Minimum amount of bleeding was controlled with Pressure. The procedure was tolerated well. Post Debridement Measurements: 0.3cm length x 1cm width x 0.1cm depth; 0.024cm^3 volume. Character of Wound/Ulcer Post Debridement is improved. Post procedure Diagnosis Wound #8: Same as Pre-Procedure Wound #9 Pre-procedure diagnosis of Wound #9 is an Abrasion located on the Right Hand - Dorsum . There was a Selective/Open Wound Non-Viable Tissue Debridement with a total area of 1.07 sq cm performed by Duanne Guess, MD. With the following instrument(s): Curette to remove Non-Viable tissue/material. Material removed includes [facility] after achieving pain control using Lidocaine 4% Topical Solution. No specimens were taken. A time out was conducted at 10:48, prior to the start of the procedure. A Minimum amount of bleeding was controlled with Pressure. The procedure was tolerated well. Post Debridement Measurements: 1.7cm length x 0.8cm width x 0.1cm depth; 0.107cm^3 volume. Character of Wound/Ulcer Post Debridement is improved. Post procedure Diagnosis Wound #9: Same as Pre-Procedure Plan Follow-up Appointments: Return appointment in 1 month. - Dr. Lady Gary - room 2 Anesthetic: (In clinic) Topical Lidocaine 4% applied to wound bed Bathing/ Shower/ Hygiene: May shower and wash wound with soap and water. The following medication(s) was prescribed: lidocaine topical 4 % cream cream topical was prescribed at facility WOUND #10: - Abdomen - Lower Quadrant Wound Laterality: Left Cleanser: Soap and Water 1 x Per Day/30 Days Discharge Instructions: May shower and wash wound with dial antibacterial soap and water prior to dressing change. Cleanser: Wound Cleanser 1 x Per Day/30 Days Discharge Instructions: Cleanse the wound with wound cleanser prior to applying a  clean dressing using gauze sponges, not tissue or cotton balls. Topical: Mupirocin Ointment 1 x Per Day/30 Days Discharge Instructions: Apply Mupirocin (Bactroban) as instructed Prim Dressing: Maxorb Extra Ag+ Alginate Dressing, 2x2 (in/in) (Generic) 1 x Per Day/30 Days ary Discharge Instructions: Apply to wound bed as instructed Secondary Dressing: T  Non-Adherent Dressing, 3x4 in 1 x Per Day/30 Days elfa Discharge Instructions: Apply over primary dressing as directed. Secondary Dressing: Woven Gauze Sponge, Non-Sterile 4x4 in 1 x Per Day/30 Days Discharge Instructions: Apply over primary dressing as directed. Secured With: 46M Medipore H Soft Cloth Surgical T ape, 4 x 10 (in/yd) 1 x Per Day/30 Days Discharge Instructions: Secure with tape as directed. WOUND #3: - Hand - Dorsum Wound Laterality: Left Cleanser: Soap and Water 1 x Per Day/30 Days Discharge Instructions: May shower and wash wound with dial antibacterial soap and water prior to dressing change. Cleanser: Wound Cleanser 1 x Per Day/30 Days Discharge Instructions: Cleanse the wound with wound cleanser prior to applying a clean dressing using gauze sponges, not tissue or cotton balls. Topical: Mupirocin Ointment 1 x Per Day/30 Days Discharge Instructions: Apply Mupirocin (Bactroban) as instructed Prim Dressing: Maxorb Extra Ag+ Alginate Dressing, 2x2 (in/in) (Generic) 1 x Per Day/30 Days ary Discharge Instructions: Apply to wound bed as instructed Secondary Dressing: T Non-Adherent Dressing, 3x4 in 1 x Per Day/30 Days elfa Discharge Instructions: Apply over primary dressing as directed. [name], [name] ([id number]) [id number] Page 12 of 14 Secondary Dressing: Woven Gauze Sponge, Non-Sterile 4x4 in 1 x Per Day/30 Days Discharge Instructions: Apply over primary dressing as directed. Secured With: 46M Medipore H Soft Cloth Surgical T ape, 4 x 10 (in/yd) 1 x Per Day/30 Days Discharge Instructions:  Secure with tape as directed. WOUND #7: - Lower Leg Wound Laterality: Right, Anterior Cleanser: Soap and Water 1 x Per Day/30 Days Discharge Instructions: May shower and wash wound with dial antibacterial soap and water prior to dressing change. Cleanser: Wound Cleanser 1 x Per Day/30 Days Discharge Instructions: Cleanse the wound with wound cleanser prior to applying a clean dressing using gauze sponges, not tissue or cotton balls. Topical: Mupirocin Ointment 1 x Per Day/30 Days Discharge Instructions: Apply Mupirocin (Bactroban) as instructed Prim Dressing: Maxorb Extra Ag+ Alginate Dressing, 2x2 (in/in) (Generic) 1 x Per Day/30 Days ary Discharge Instructions: Apply to wound bed as instructed Secondary Dressing: T Non-Adherent Dressing, 3x4 in 1 x Per Day/30 Days elfa Discharge Instructions: Apply over primary dressing as directed. Secondary Dressing: Woven Gauze Sponge, Non-Sterile 4x4 in 1 x Per Day/30 Days Discharge Instructions: Apply over primary dressing as directed. Secured With: 46M Medipore H Soft Cloth Surgical T ape, 4 x 10 (in/yd) 1 x Per Day/30 Days Discharge Instructions: Secure with tape as directed. WOUND #8: - Lower Leg Wound Laterality: Right, Medial Cleanser: Soap and Water 1 x Per Day/30 Days Discharge Instructions: May shower and wash wound with dial antibacterial soap and water prior to dressing change. Cleanser: Wound Cleanser 1 x Per Day/30 Days Discharge Instructions: Cleanse the wound with wound cleanser prior to applying a clean dressing using gauze sponges, not tissue or cotton balls. Topical: Mupirocin Ointment 1 x Per Day/30 Days Discharge Instructions: Apply Mupirocin (Bactroban) as instructed Prim Dressing: Maxorb Extra Ag+ Alginate Dressing, 2x2 (in/in) (Generic) 1 x Per Day/30 Days ary Discharge Instructions: Apply to wound bed as instructed Secondary Dressing: T Non-Adherent Dressing, 3x4 in 1 x Per Day/30 Days elfa Discharge Instructions: Apply over  primary dressing as directed. Secondary Dressing: Woven Gauze Sponge, Non-Sterile 4x4 in 1 x Per Day/30 Days Discharge Instructions: Apply over primary dressing as directed. Secured With: 46M Medipore H Soft Cloth Surgical T ape, 4 x 10 (in/yd) 1 x Per Day/30 Days Discharge Instructions: Secure with tape as directed. WOUND #9: - Hand - Dorsum Wound Laterality:  Right Cleanser: Soap and Water 1 x Per Day/30 Days Discharge Instructions: May shower and wash wound with dial antibacterial soap and water prior to dressing change. Cleanser: Wound Cleanser 1 x Per Day/30 Days Discharge Instructions: Cleanse the wound with wound cleanser prior to applying a clean dressing using gauze sponges, not tissue or cotton balls. Topical: Mupirocin Ointment 1 x Per Day/30 Days Discharge Instructions: Apply Mupirocin (Bactroban) as instructed Prim Dressing: Maxorb Extra Ag+ Alginate Dressing, 2x2 (in/in) (Generic) 1 x Per Day/30 Days ary Discharge Instructions: Apply to wound bed as instructed Secondary Dressing: T Non-Adherent Dressing, 3x4 in 1 x Per Day/30 Days elfa Discharge Instructions: Apply over primary dressing as directed. Secondary Dressing: Woven Gauze Sponge, Non-Sterile 4x4 in 1 x Per Day/30 Days Discharge Instructions: Apply over primary dressing as directed. Secured With: 31M Medipore H Soft Cloth Surgical T ape, 4 x 10 (in/yd) 1 x Per Day/30 Days Discharge Instructions: Secure with tape as directed. 11/20/2022: The left knee wound is healed. She has reopened the wounds on her right hand and left wrist. The shoulder remains healed. She also has a new wound on her abdomen. I used a curette to debride slough and eschar from each of her hand wounds, eschar from her new abdominal wound, slough and eschar from her distal right lower leg wound and slough from her proximal right lower leg wound. Unfortunately, given her disorder, she is likely to have a chronic issue with frequent rewounding events. We  will apply mupirocin with silver alginate to each of her wound sites. Follow-up in 1 month. Electronic Signature(s) Signed: 11/20/2022 11:18:15 AM By: Duanne Guess MD FACS Entered By: Duanne Guess on 11/20/2022 08:18:15 -------------------------------------------------------------------------------- HxROS Details Patient Name: Date of Service: DO [name], MA [name]. 11/20/2022 10:45 A M Medical Record Number: [id number] Patient Account Number: [id number] Date of Birth/Sex: Treating RN: [birth year] (21 y.o. F) Primary Care Provider: Enid Skeens Other Clinician: Referring Provider: Treating Provider/Extender: Arther Dames in Treatment: [address] Information Obtained From Caregiver Brandon, Leilanny Black ([id number]) [id number] Page 13 of 14 Constitutional Symptoms (General Health) Medical History: Past Medical History Notes: obese Endocrine Medical History: Positive for: Type II Diabetes Time with diabetes: 3 yrs Treated with: Insulin Blood sugar tested every day: Yes Tested : Musculoskeletal Medical History: Past Medical History Notes: scoliosis Neurologic Medical History: Past Medical History Notes: Prader-willi syndrome Psychiatric Medical History: Past Medical History Notes: compulsive skin picking Immunizations Pneumococcal Vaccine: Received Pneumococcal Vaccination: No Implantable Devices None Hospitalization / Surgery History Type of Hospitalization/Surgery knee surgery myringotomy Family and Social History Cancer: No; Diabetes: No; Heart Disease: No; Hereditary Spherocytosis: No; Hypertension: Yes - Father; Kidney Disease: No; Lung Disease: No; Seizures: No; Stroke: No; Thyroid Problems: Yes - Father; Tuberculosis: No; Never smoker; Marital Status - Single; Alcohol Use: Never; Drug Use: No History; Caffeine Use: Never; Financial Concerns: No; Food, Clothing or Shelter Needs: No; Support System Lacking: No;  Transportation Concerns: No Electronic Signature(s) Signed: 11/20/2022 11:49:36 AM By: Duanne Guess MD FACS Entered By: Duanne Guess on 11/20/2022 08:15:00 -------------------------------------------------------------------------------- SuperBill Details Patient Name: Date of Service: DO [name], MA [name]. 11/20/2022 Medical Record Number: [id number] Patient Account Number: [id number] Date of Birth/Sex: Treating RN: [birth year] (21 y.o. F) Primary Care Provider: Enid Skeens Other Clinician: Referring Provider: Treating Provider/Extender: Arther Dames in Treatment: [address] Diagnosis Coding ICD-10 Codes PHINLEY, SCHRACK ([id number]) [id number] Page 14 of 14 Code Description [phone] Non-pressure chronic ulcer of other part of right  lower leg with fat layer exposed L98.492 Non-pressure chronic ulcer of skin 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 F42.4 Excoriation (skin-picking) disorder Q87.11 Prader-Willi syndrome Z68.45 Body mass index [BMI] 70 or greater, adult E11.65 Type 2 diabetes mellitus with hyperglycemia Facility Procedures : CPT4 Code: [id number] Description: 97597 - DEBRIDE WOUND 1ST 20 SQ CM OR < ICD-10 Diagnosis Description L97.812 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os L98.492 Non-pressure chronic ulcer of skin 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 Modifier: ed Quantity: 1 Physician Procedures : CPT4 Code Description Modifier [id number] - WC PHYS LEVEL 4 - EST PT 25 ICD-10 Diagnosis Description L97.812 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osed L98.492 Non-pressure chronic ulcer of skin of other sites  with fat layer exposed F42.4 Excoriation (skin-picking) disorder Q87.11 Prader-Willi syndrome Quantity: 1 : [id number] - WC PHYS DEBR WO ANESTH 20 SQ CM ICD-10 Diagnosis Description L97.812 Non-pressure chronic ulcer of other part of  right lower leg with fat layer exposed L98.492 Non-pressure chronic ulcer of skin 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 Quantity: 1 Electronic Signature(s) Signed: 11/20/2022 11:18:37 AM By: Duanne Guess MD FACS Entered By: Duanne Guess on 11/20/2022 08:18:37

## 2022-11-20 NOTE — Progress Notes (Signed)
[name], [facility] ([id number]) [id number] Page 1 of 15 Visit Report for 11/20/2022 Arrival Information Details Patient Name: Date of Service: DO [name], [place] 11/20/2022 10:45 A M Medical Record Number: [id number] Patient Account Number: [id number] Date of Birth/Sex: Treating RN: 07/12/2000 (21 y.o. [name] Primary Care Salwa Bai: Enid Skeens Other Clinician: Referring Ashantia Amaral: Treating Deveion Denz/Extender: Arther Dames in Treatment: 25 Visit Information History Since Last Visit Added or deleted any medications: No Patient Arrived: [name] Any new allergies or adverse reactions: No Arrival Time: 10:25 Had a fall or experienced change in No Accompanied By: father activities of daily living that may affect Transfer Assistance: Manual risk of falls: Patient Identification Verified: Yes Signs or symptoms of abuse/neglect since last visito No Secondary Verification Process Completed: Yes Implantable device outside of the clinic excluding No Patient Requires Transmission-Based Precautions: No cellular tissue based products placed in the center since last visit: Has Dressing in Place as Prescribed: Yes Pain Present Now: No Electronic Signature(s) Signed: 11/20/2022 1:56:44 PM By: Dayton Scrape Entered By: Dayton Scrape on 11/20/2022 07:31:30 -------------------------------------------------------------------------------- Encounter Discharge Information Details Patient Name: Date of Service: [name], [name] 11/20/2022 10:45 A M Medical Record Number: [id number] Patient Account Number: [id number] Date of Birth/Sex: Treating RN: November 26, 2000 (21 y.o. [name] Primary Care Pieter Fooks: Enid Skeens Other Clinician: Referring Tyreon Frigon: Treating Cylas Falzone/Extender: Arther Dames in Treatment: 25 Encounter Discharge Information Items Post Procedure Vitals Discharge Condition:  Stable Temperature (F): 97.7 Ambulatory Status: Walker Pulse (bpm): 89 Discharge Destination: Home Respiratory Rate (breaths/min): 18 Transportation: Private Auto Blood Pressure (mmHg): 163/94 Accompanied By: father Schedule Follow-up Appointment: Yes Clinical Summary of Care: Patient Declined Electronic Signature(s) Signed: 11/20/2022 3:03:34 PM By: Samuella Bruin Entered By: Samuella Bruin on 11/20/2022 08:04:56 Kocian, Scotty R ([id number]) [id number] Page 2 of 15 -------------------------------------------------------------------------------- Lower Extremity Assessment Details Patient Name: Date of Service: DO [name], [place] 11/20/2022 10:45 A M Medical Record Number: [id number] Patient Account Number: [id number] Date of Birth/Sex: Treating RN: 2000-08-31 (21 y.o. [name] Primary Care Paislyn Domenico: Enid Skeens Other Clinician: Referring Maguadalupe Lata: Treating Leira Regino/Extender: Arther Dames in Treatment: 25 Electronic Signature(s) Signed: 11/20/2022 3:03:34 PM By: Gelene Mink By: Samuella Bruin on 11/20/2022 07:26:12 -------------------------------------------------------------------------------- Multi Wound Chart Details Patient Name: Date of Service: [name], [name] 11/20/2022 10:45 A M Medical Record Number: [id number] Patient Account Number: [id number] Date of Birth/Sex: Treating RN: 2000/12/10 (21 y.o. F) Primary Care Govanni Plemons: Enid Skeens Other Clinician: Referring Kynlea Blackston: Treating Tiare Rohlman/Extender: Arther Dames in Treatment: 25 Vital Signs Height(in): 60 Pulse(bpm): 89 Weight(lbs): 380 Blood Pressure(mmHg): 163/94 Body Mass Index(BMI): 74.2 Temperature(F): 97.7 Respiratory Rate(breaths/min): 18 [10:Photos:] Left Abdomen - Lower Quadrant Left Hand - Dorsum Left, Anterior Lower Leg Wound Location: Skin Tear/Laceration Skin Tear/Laceration  Skin Tear/Laceration Wounding Event: Abrasion Abrasion Abrasion Primary Etiology: Type II Diabetes Type II Diabetes Type II Diabetes Comorbid History: 11/20/2022 09/08/2022 09/08/2022 Date Acquired: 0 8 8 Weeks of Treatment: Open Open Healed - Epithelialized Wound Status: No No No Wound Recurrence: 0.7x0.3x0.1 2x1x0.1 0x0x0 Measurements L x W x D (cm) 0.165 1.571 0 A (cm) : rea 0.016 0.157 0 Volume (cm) : N/A -122.20% 100.00% % Reduction in A rea: N/A -121.10% 100.00% % Reduction in Volume: Full Thickness Without Exposed Full Thickness Without Exposed Full Thickness Without Exposed Classification: Support Structures Support Structures Support Structures Medium Medium None Present Exudate Amount: Serosanguineous Serous  N/A Exudate Type: red, brown amber N/A Exudate Color: Distinct, outline attached Distinct, outline attached Distinct, outline attached Wound Margin: Small (1-33%) Large (67-100%) None Present (0%) Granulation Amount: Red Red, Pink N/A Granulation Quality: Large (67-100%) Small (1-33%) None Present (0%) Necrotic Amount: ANDRA, BELOUS ([id number]) [id number] Page 3 of 15 Eschar, Adherent [employer], Adherent [employer] N/A Necrotic Tissue: Fat Layer (Subcutaneous Tissue): Yes Fat Layer (Subcutaneous Tissue): Yes Fascia: No Exposed Structures: Fascia: No Fascia: No Fat Layer (Subcutaneous Tissue): No Tendon: No Tendon: No Tendon: No Muscle: No Muscle: No Muscle: No Joint: No Joint: No Joint: No Bone: No Bone: No Bone: No Small (1-33%) Medium (34-66%) Large (67-100%) Epithelialization: Debridement - Selective/Open Wound Debridement - Selective/Open Wound N/A Debridement: Pre-procedure Verification/Time Out 10:48 10:48 N/A Taken: Lidocaine 4% Topical Solution Lidocaine 4% Topical Solution N/A Pain Control: Necrotic/Eschar Necrotic/Eschar, Slough N/A Tissue Debrided: Non-Viable Tissue Non-Viable Tissue  N/A Level: 0.16 1.57 N/A Debridement A (sq cm): rea Curette Curette N/A Instrument: Minimum Minimum N/A Bleeding: Pressure Pressure N/A Hemostasis A chieved: Procedure was tolerated well Procedure was tolerated well N/A Debridement Treatment Response: 0.7x0.3x0.1 2x1x0.1 N/A Post Debridement Measurements L x W x D (cm) 0.016 0.157 N/A Post Debridement Volume: (cm) No Abnormalities Noted No Abnormalities Noted Scarring: No Periwound Skin Texture: No Abnormalities Noted No Abnormalities Noted No Abnormalities Noted Periwound Skin Moisture: No Abnormalities Noted No Abnormalities Noted No Abnormalities Noted Periwound Skin Color: No Abnormality No Abnormality No Abnormality Temperature: Debridement Debridement N/A Procedures Performed: Wound Number: [id number] [phone]  Photos: Right, Anterior Lower Leg Right, Medial Lower Leg Right Hand - Dorsum Wound Location: Skin T ear/Laceration Skin T ear/Laceration Skin T ear/Laceration Wounding Event: Abrasion Abrasion Abrasion Primary Etiology: Type II Diabetes Type II Diabetes Type II Diabetes Comorbid History: 09/25/2022 09/25/2022 11/16/2022 Date Acquired: 4 4 0 Weeks of Treatment: Open Open Open Wound Status: No No No Wound Recurrence: 2.5x3.8x0.1 0.3x1x0.1 1.7x0.8x0.1 Measurements L x W x D (cm) 7.461 0.236 1.068 A (cm) : rea 0.746 0.024 0.107 Volume (cm) : 73.60% 90.00% N/A % Reduction in A rea: 73.60% 89.80% N/A % Reduction in Volume: Full Thickness Without Exposed Full Thickness Without Exposed Full Thickness Without Exposed Classification: Support Structures Support Structures Support Structures Medium Medium Medium Exudate A mount: Serosanguineous Serosanguineous Serosanguineous Exudate Type: red, brown red, brown red, brown Exudate Color: Distinct, outline attached Distinct, outline attached Distinct, outline attached Wound Margin: Large (67-100%) Medium (34-66%) Large (67-100%) Granulation A mount: Red Red  Pink Granulation Quality: Small (1-33%) Medium (34-66%) Small (1-33%) Necrotic A mount: Eschar, Adherent Slough Eschar, Adherent [employer] Necrotic Tissue: Fat Layer (Subcutaneous Tissue): Yes Fat Layer (Subcutaneous Tissue): Yes Fat Layer (Subcutaneous Tissue): Yes Exposed Structures: Fascia: No Fascia: No Fascia: No Tendon: No Tendon: No Tendon: No Muscle: No Muscle: No Muscle: No Joint: No Joint: No Joint: No Bone: No Bone: No Bone: No Small (1-33%) Small (1-33%) Small (1-33%) Epithelialization: Debridement - Selective/Open Wound Debridement - Selective/Open Wound Debridement - Selective/Open Wound Debridement: Pre-procedure Verification/Time Out 10:48 10:48 10:48 Taken: Lidocaine 4% Topical Solution Lidocaine 4% Topical Solution Lidocaine 4% Topical Solution Pain Control: Ambulance person, [employer] Tissue Debrided: Non-Viable Tissue Non-Viable Tissue Non-Viable Tissue Level: 7.46 0.24 1.07 Debridement A (sq cm): rea Curette Curette Curette Instrument: Minimum Minimum Minimum Bleeding: Pressure Pressure Pressure Hemostasis A chieved: Procedure was tolerated well Procedure was tolerated well Procedure was tolerated well Debridement Treatment Response: 2.5x3.8x0.1 0.3x1x0.1 1.7x0.8x0.1 Post Debridement Measurements L x W x D (cm) 0.746 0.024 0.107 Post  Debridement Volume: (cm) No Abnormalities Noted Scarring: No No Abnormalities Noted Periwound Skin Texture: No Abnormalities Noted No Abnormalities Noted No Abnormalities Noted Periwound Skin Moisture: Rubor: Yes No Abnormalities Noted No Abnormalities Noted Periwound Skin ColorARYSSA, SKWAREK R ([id number]) [id number] Page 4 of 15 No Abnormality No Abnormality No Abnormality Temperature: Debridement Debridement Debridement Procedures Performed: Treatment Notes Electronic Signature(s) Signed: 11/20/2022 11:00:09 AM By: Duanne Guess MD FACS Entered  By: Duanne Guess on 11/20/2022 08:00:09 -------------------------------------------------------------------------------- Multi-Disciplinary Care Plan Details Patient Name: Date of Service: [name], [name] 11/20/2022 10:45 A M Medical Record Number: [id number] Patient Account Number: [id number] Date of Birth/Sex: Treating RN: 2000-05-21 (21 y.o. [name] Primary Care Dhyan Noah: Enid Skeens Other Clinician: Referring Amaurie Wandel: Treating Idonia Zollinger/Extender: Arther Dames in Treatment: 25 Active Inactive Necrotic Tissue Nursing Diagnoses: Impaired tissue integrity related to necrotic/devitalized tissue Knowledge deficit related to management of necrotic/devitalized tissue Goals: Necrotic/devitalized tissue will be minimized in the wound bed Date Initiated: 05/23/2022 Target Resolution Date: 01/26/2023 Goal Status: Active Patient/caregiver will verbalize understanding of reason and process for debridement of necrotic tissue Date Initiated: 05/23/2022 Target Resolution Date: 01/26/2023 Goal Status: Active Interventions: Assess patient pain level pre-, during and post procedure and prior to discharge Provide education on necrotic tissue and debridement process Treatment Activities: Apply topical anesthetic as ordered : 05/23/2022 Notes: Wound/Skin Impairment Nursing Diagnoses: Impaired tissue integrity Knowledge deficit related to ulceration/compromised skin integrity Goals: Patient/caregiver will verbalize understanding of skin care regimen Date Initiated: 05/23/2022 Target Resolution Date: 01/26/2023 Goal Status: Active Interventions: Assess patient/caregiver ability to perform ulcer/skin care regimen upon admission and as needed Assess ulceration(s) every visit Treatment Activities: Skin care regimen initiated : 05/23/2022 Topical wound management initiated : 05/23/2022 Notes: Electronic Signature(s) YURIANNA, BELTRAMO ([id number])  [id number] Page 5 of 15 Signed: 11/20/2022 3:03:34 PM By: Samuella Bruin Entered By: Samuella Bruin on 11/20/2022 07:27:03 -------------------------------------------------------------------------------- Pain Assessment Details Patient Name: Date of Service: [name], [name] 11/20/2022 10:45 A M Medical Record Number: [id number] Patient Account Number: [id number] Date of Birth/Sex: Treating RN: 2000-12-20 (21 y.o. [name] Primary Care Oiva Dibari: Enid Skeens Other Clinician: Referring Teddie Curd: Treating Reis Pienta/Extender: Arther Dames in Treatment: 25 Active Problems Location of Pain Severity and Description of Pain Patient Has Paino No Site Locations Rate the pain. Current Pain Level: 0 Pain Management and Medication Current Pain Management: Electronic Signature(s) Signed: 11/20/2022 3:03:34 PM By: Samuella Bruin Entered By: Samuella Bruin on 11/20/2022 07:26:06 -------------------------------------------------------------------------------- Patient/Caregiver Education Details Patient Name: Date of Service: [name], MA DISO Anders Simmonds. 8/26/2024andnbsp10:45 A M Medical Record Number: [id number] Patient Account Number: [id number] Date of Birth/Gender: Treating RN: December 28, 2000 (21 y.o. [name] Primary Care Physician: Enid Skeens Other Clinician: Referring Physician: Treating Physician/Extender: Arther Dames in Treatment: 25 Education Assessment Education Provided To: Patient PAISLEYANN, DEWITT ([id number]) [id number] Page 6 of 15 Education Topics Provided Wound/Skin Impairment: Methods: Explain/Verbal Responses: Reinforcements needed, State content correctly Electronic Signature(s) Signed: 11/20/2022 3:03:34 PM By: Samuella Bruin Entered By: Samuella Bruin on 11/20/2022  07:27:18 -------------------------------------------------------------------------------- Wound Assessment Details Patient Name: Date of Service: [name], [name] 11/20/2022 10:45 A M Medical Record Number: [id number] Patient Account Number: [id number] Date of Birth/Sex: Treating RN: Aug 22, 2000 (21 y.o. [name] Primary Care Jericho Alcorn: Enid Skeens Other Clinician: Referring Ennis Heavner: Treating Sunshyne Horvath/Extender: Arther Dames in Treatment: 25 Wound Status Wound Number: 10 Primary Etiology: Abrasion Wound Location:  Left Abdomen - Lower Quadrant Wound Status: Open Wounding Event: Skin Tear/Laceration Comorbid History: Type II Diabetes Date Acquired: 11/20/2022 Weeks Of Treatment: 0 Clustered Wound: No Photos Wound Measurements Length: (cm) 0.7 Width: (cm) 0.3 Depth: (cm) 0.1 Area: (cm) 0.165 Volume: (cm) 0.016 % Reduction in Area: % Reduction in Volume: Epithelialization: Small (1-33%) Tunneling: No Undermining: No Wound Description Classification: Full Thickness Without Exposed Support Structures Wound Margin: Distinct, outline attached Exudate Amount: Medium Exudate Type: Serosanguineous Exudate Color: red, brown Foul Odor After Cleansing: No Slough/Fibrino Yes Wound Bed Granulation Amount: Small (1-33%) Exposed Structure Granulation Quality: Red Fascia Exposed: No Necrotic Amount: Large (67-100%) Fat Layer (Subcutaneous Tissue) Exposed: Yes Necrotic Quality: Eschar, Adherent Slough Tendon Exposed: No Muscle Exposed: No Joint Exposed: No Bone Exposed: No [address] Lexianna Breitzman, Cyla R [phone]) [id number] Page 7 of 15 Texture Color No Abnormalities Noted: Yes No Abnormalities Noted: Yes Moisture Temperature / Pain No Abnormalities Noted: Yes Temperature: No Abnormality Treatment Notes Wound #10 (Abdomen - Lower Quadrant) Wound Laterality: Left Cleanser Soap and Water Discharge  Instruction: May shower and wash wound with dial antibacterial soap and water prior to dressing change. Wound Cleanser Discharge Instruction: Cleanse the wound with wound cleanser prior to applying a clean dressing using gauze sponges, not tissue or cotton balls. Peri-Wound Care Topical Mupirocin Ointment Discharge Instruction: Apply Mupirocin (Bactroban) as instructed Primary Dressing Maxorb Extra Ag+ Alginate Dressing, 2x2 (in/in) Discharge Instruction: Apply to wound bed as instructed Secondary Dressing T Non-Adherent Dressing, 3x4 in elfa Discharge Instruction: Apply over primary dressing as directed. Secured With 40M Medipore H Soft Cloth Surgical T ape, 4 x 10 (in/yd) Discharge Instruction: Secure with tape as directed. Compression Wrap Compression Stockings Add-Ons Electronic Signature(s) Signed: 11/20/2022 1:56:44 PM By: Dayton Scrape Signed: 11/20/2022 3:03:34 PM By: Samuella Bruin Entered By: Dayton Scrape on 11/20/2022 07:43:54 -------------------------------------------------------------------------------- Wound Assessment Details Patient Name: Date of Service: [name], [name] 11/20/2022 10:45 A M Medical Record Number: [id number] Patient Account Number: [id number] Date of Birth/Sex: Treating RN: 2000-12-07 (21 y.o. [name] Primary Care Zyire Eidson: Enid Skeens Other Clinician: Referring Irwin Toran: Treating Toris Laverdiere/Extender: Arther Dames in Treatment: 25 Wound Status Wound Number: 3 Primary Etiology: Abrasion Wound Location: Left Hand - Dorsum Wound Status: Open Wounding Event: Skin Tear/Laceration Comorbid History: Type II Diabetes Date Acquired: 09/08/2022 Weeks Of Treatment: 8 Clustered Wound: No Photos JANILYN, GALLON R ([id number]) [id number] Page 8 of 15 Wound Measurements Length: (cm) 2 Width: (cm) 1 Depth: (cm) 0.1 Area: (cm) 1.571 Volume: (cm) 0.157 % Reduction in Area: -122.2% %  Reduction in Volume: -121.1% Epithelialization: Medium (34-66%) Tunneling: No Undermining: No Wound Description Classification: Full Thickness Without Exposed Support Structures Wound Margin: Distinct, outline attached Exudate Amount: Medium Exudate Type: Serous Exudate Color: amber Foul Odor After Cleansing: No Slough/Fibrino Yes Wound Bed Granulation Amount: Large (67-100%) Exposed Structure Granulation Quality: Red, Pink Fascia Exposed: No Necrotic Amount: Small (1-33%) Fat Layer (Subcutaneous Tissue) Exposed: Yes Necrotic Quality: Eschar, Adherent Slough Tendon Exposed: No Muscle Exposed: No Joint Exposed: No Bone Exposed: No Periwound Skin Texture Texture Color No Abnormalities Noted: Yes No Abnormalities Noted: Yes Moisture Temperature / Pain No Abnormalities Noted: Yes Temperature: No Abnormality Treatment Notes Wound #3 (Hand - Dorsum) Wound Laterality: Left Cleanser Soap and Water Discharge Instruction: May shower and wash wound with dial antibacterial soap and water prior to dressing change. Wound Cleanser Discharge Instruction: Cleanse the wound with wound cleanser prior to applying a clean dressing using gauze  sponges, not tissue or cotton balls. Peri-Wound Care Topical Mupirocin Ointment Discharge Instruction: Apply Mupirocin (Bactroban) as instructed Primary Dressing Maxorb Extra Ag+ Alginate Dressing, 2x2 (in/in) Discharge Instruction: Apply to wound bed as instructed Secondary Dressing T Non-Adherent Dressing, 3x4 in elfa Discharge Instruction: Apply over primary dressing as directed. Secured With 58M Medipore H Soft Cloth Surgical T ape, 4 x 10 (in/yd) Discharge Instruction: Secure with tape as directed. Compression Wrap Compression Stockings MASIE, CHIARAMONTE R ([id number]) [id number] Page 9 of 15 Add-Ons Electronic Signature(s) Signed: 11/20/2022 1:56:44 PM By: Dayton Scrape Signed: 11/20/2022 3:03:34 PM By: Gelene Mink By: Dayton Scrape on 11/20/2022 07:40:41 -------------------------------------------------------------------------------- Wound Assessment Details Patient Name: Date of Service: [name], [name] 11/20/2022 10:45 A M Medical Record Number: [id number] Patient Account Number: [id number] Date of Birth/Sex: Treating RN: 2000/07/26 (21 y.o. [name] Primary Care Isobel Eisenhuth: Enid Skeens Other Clinician: Referring Jamiyla Ishee: Treating Yarissa Reining/Extender: Arther Dames in Treatment: 25 Wound Status Wound Number: 6 Primary Etiology: Abrasion Wound Location: Left, Anterior Lower Leg Wound Status: Healed - Epithelialized Wounding Event: Skin Tear/Laceration Comorbid History: Type II Diabetes Date Acquired: 09/08/2022 Weeks Of Treatment: 8 Clustered Wound: No Photos Wound Measurements Length: (cm) Width: (cm) Depth: (cm) Area: (cm) Volume: (cm) 0 % Reduction in Area: 100% 0 % Reduction in Volume: 100% 0 Epithelialization: Large (67-100%) 0 Tunneling: No 0 Undermining: No Wound Description Classification: Full Thickness Without Exposed Suppor Wound Margin: Distinct, outline attached Exudate Amount: None Present t Structures Foul Odor After Cleansing: No Slough/Fibrino No Wound Bed Granulation Amount: None Present (0%) Exposed Structure Necrotic Amount: None Present (0%) Fascia Exposed: No Fat Layer (Subcutaneous Tissue) Exposed: No Tendon Exposed: No Muscle Exposed: No Joint Exposed: No Bone Exposed: No Periwound Skin Texture Texture Color No Abnormalities Noted: No No Abnormalities Noted: Yes Scarring: No Temperature / Pain Temperature: No Abnormality Moisture No Abnormalities NotedNEYRA, HAJDU R ([id number]) [id number] Page 10 of 15 Treatment Notes Wound #6 (Lower Leg) Wound Laterality: Left, Anterior Cleanser Peri-Wound Care Topical Primary Dressing Secondary Dressing Secured  With Compression Wrap Compression Stockings Add-Ons Electronic Signature(s) Signed: 11/20/2022 3:03:34 PM By: Samuella Bruin Entered By: Samuella Bruin on 11/20/2022 07:47:47 -------------------------------------------------------------------------------- Wound Assessment Details Patient Name: Date of Service: [name], [name] 11/20/2022 10:45 A M Medical Record Number: [id number] Patient Account Number: [id number] Date of Birth/Sex: Treating RN: 2000/08/09 (21 y.o. [name] Primary Care Cort Dragoo: Enid Skeens Other Clinician: Referring Romain Erion: Treating Rebekah Sprinkle/Extender: Arther Dames in Treatment: 25 Wound Status Wound Number: 7 Primary Etiology: Abrasion Wound Location: Right, Anterior Lower Leg Wound Status: Open Wounding Event: Skin Tear/Laceration Comorbid History: Type II Diabetes Date Acquired: 09/25/2022 Weeks Of Treatment: 4 Clustered Wound: No Photos Wound Measurements Length: (cm) 2.5 Width: (cm) 3.8 Depth: (cm) 0.1 Area: (cm) 7.461 Volume: (cm) 0.746 % Reduction in Area: 73.6% % Reduction in Volume: 73.6% Epithelialization: Small (1-33%) Tunneling: No Undermining: No Wound Description Classification: Full Thickness Without Exposed Support Structures Wound Margin: Distinct, outline attached Exudate Amount: Medium Vermette, Syra R ([id number]) Exudate Type: Serosanguineous Exudate Color: red, brown Foul Odor After Cleansing: No Slough/Fibrino Yes [id number] Page 11 of 15 Wound Bed Granulation Amount: Large (67-100%) Exposed Structure Granulation Quality: Red Fascia Exposed: No Necrotic Amount: Small (1-33%) Fat Layer (Subcutaneous Tissue) Exposed: Yes Necrotic Quality: Eschar, Adherent Slough Tendon Exposed: No Muscle Exposed: No Joint Exposed: No Bone Exposed: No Periwound Skin Texture Texture Color No Abnormalities Noted: Yes No Abnormalities  Noted: No Rubor:  Yes Moisture No Abnormalities Noted: Yes Temperature / Pain Temperature: No Abnormality Treatment Notes Wound #7 (Lower Leg) Wound Laterality: Right, Anterior Cleanser Soap and Water Discharge Instruction: May shower and wash wound with dial antibacterial soap and water prior to dressing change. Wound Cleanser Discharge Instruction: Cleanse the wound with wound cleanser prior to applying a clean dressing using gauze sponges, not tissue or cotton balls. Peri-Wound Care Topical Mupirocin Ointment Discharge Instruction: Apply Mupirocin (Bactroban) as instructed Primary Dressing Maxorb Extra Ag+ Alginate Dressing, 2x2 (in/in) Discharge Instruction: Apply to wound bed as instructed Secondary Dressing T Non-Adherent Dressing, 3x4 in elfa Discharge Instruction: Apply over primary dressing as directed. Secured With 26M Medipore H Soft Cloth Surgical T ape, 4 x 10 (in/yd) Discharge Instruction: Secure with tape as directed. Compression Wrap Compression Stockings Add-Ons Electronic Signature(s) Signed: 11/20/2022 1:56:44 PM By: Dayton Scrape Signed: 11/20/2022 3:03:34 PM By: Samuella Bruin Entered By: Dayton Scrape on 11/20/2022 07:41:51 -------------------------------------------------------------------------------- Wound Assessment Details Patient Name: Date of Service: [name], [name] 11/20/2022 10:45 A M Medical Record Number: [id number] Patient Account Number: [id number] Date of Birth/Sex: Treating RN: 02-08-2001 (21 y.o. [name] Primary Care Raelee Rossmann: Enid Skeens Other Clinician: Referring Laurette Villescas: Treating Manuella Blackson/Extender: Arther Dames in Treatment: [address] Beatriz R ([id number]) [id number] Page 12 of 15 Wound Status Wound Number: 8 Primary Etiology: Abrasion Wound Location: Right, Medial Lower Leg Wound Status: Open Wounding Event: Skin Tear/Laceration Comorbid History: Type II Diabetes Date  Acquired: 09/25/2022 Weeks Of Treatment: 4 Clustered Wound: No Photos Wound Measurements Length: (cm) 0.3 Width: (cm) 1 Depth: (cm) 0.1 Area: (cm) 0.236 Volume: (cm) 0.024 % Reduction in Area: 90% % Reduction in Volume: 89.8% Epithelialization: Small (1-33%) Tunneling: No Undermining: No Wound Description Classification: Full Thickness Without Exposed Support Structures Wound Margin: Distinct, outline attached Exudate Amount: Medium Exudate Type: Serosanguineous Exudate Color: red, brown Foul Odor After Cleansing: No Slough/Fibrino Yes Wound Bed Granulation Amount: Medium (34-66%) Exposed Structure Granulation Quality: Red Fascia Exposed: No Necrotic Amount: Medium (34-66%) Fat Layer (Subcutaneous Tissue) Exposed: Yes Necrotic Quality: Eschar, Adherent Slough Tendon Exposed: No Muscle Exposed: No Joint Exposed: No Bone Exposed: No Periwound Skin Texture Texture Color No Abnormalities Noted: No No Abnormalities Noted: Yes Scarring: No Temperature / Pain Temperature: No Abnormality Moisture No Abnormalities Noted: Yes Treatment Notes Wound #8 (Lower Leg) Wound Laterality: Right, Medial Cleanser Soap and Water Discharge Instruction: May shower and wash wound with dial antibacterial soap and water prior to dressing change. Wound Cleanser Discharge Instruction: Cleanse the wound with wound cleanser prior to applying a clean dressing using gauze sponges, not tissue or cotton balls. Peri-Wound Care Topical Mupirocin Ointment Discharge Instruction: Apply Mupirocin (Bactroban) as instructed Primary Dressing Maxorb Extra Ag+ Alginate Dressing, 2x2 (in/in) Discharge Instruction: Apply to wound bed as instructed SOHA, ACTON ([id number]) [id number] Page 13 of 15 Secondary Dressing T Non-Adherent Dressing, 3x4 in elfa Discharge Instruction: Apply over primary dressing as directed. Secured With 26M Medipore H Soft Cloth Surgical T ape, 4 x  10 (in/yd) Discharge Instruction: Secure with tape as directed. Compression Wrap Compression Stockings Add-Ons Electronic Signature(s) Signed: 11/20/2022 1:56:44 PM By: Dayton Scrape Signed: 11/20/2022 3:03:34 PM By: Samuella Bruin Entered By: Dayton Scrape on 11/20/2022 07:42:41 -------------------------------------------------------------------------------- Wound Assessment Details Patient Name: Date of Service: [name], [name] 11/20/2022 10:45 A M Medical Record Number: [id number] Patient Account Number: [id number] Date of Birth/Sex: Treating RN: 2000-04-23 (21 y.o. [name]  Primary Care Julea Hutto: Enid Skeens Other Clinician: Referring Brentlee Delage: Treating Eirik Schueler/Extender: Arther Dames in Treatment: 25 Wound Status Wound Number: 9 Primary Etiology: Abrasion Wound Location: Right Hand - Dorsum Wound Status: Open Wounding Event: Skin Tear/Laceration Comorbid History: Type II Diabetes Date Acquired: 11/16/2022 Weeks Of Treatment: 0 Clustered Wound: No Photos Wound Measurements Length: (cm) 1.7 Width: (cm) 0.8 Depth: (cm) 0.1 Area: (cm) 1.068 Volume: (cm) 0.107 % Reduction in Area: % Reduction in Volume: Epithelialization: Small (1-33%) Tunneling: No Undermining: No Wound Description Classification: Full Thickness Without Exposed Suppor Wound Margin: Distinct, outline attached Exudate Amount: Medium Exudate Type: Serosanguineous Exudate Color: red, brown t Structures Foul Odor After Cleansing: No Slough/Fibrino Yes Wound Bed Granulation Amount: Large (67-100%) Exposed Reghan Hanifan, Michal R ([id number]) [id number] Page 14 of 15 Granulation Quality: Pink Fascia Exposed: No Necrotic Amount: Small (1-33%) Fat Layer (Subcutaneous Tissue) Exposed: Yes Necrotic Quality: Adherent Slough Tendon Exposed: No Muscle Exposed: No Joint Exposed: No Bone Exposed: No Periwound Skin Texture Texture  Color No Abnormalities Noted: Yes No Abnormalities Noted: Yes Moisture Temperature / Pain No Abnormalities Noted: Yes Temperature: No Abnormality Treatment Notes Wound #9 (Hand - Dorsum) Wound Laterality: Right Cleanser Soap and Water Discharge Instruction: May shower and wash wound with dial antibacterial soap and water prior to dressing change. Wound Cleanser Discharge Instruction: Cleanse the wound with wound cleanser prior to applying a clean dressing using gauze sponges, not tissue or cotton balls. Peri-Wound Care Topical Mupirocin Ointment Discharge Instruction: Apply Mupirocin (Bactroban) as instructed Primary Dressing Maxorb Extra Ag+ Alginate Dressing, 2x2 (in/in) Discharge Instruction: Apply to wound bed as instructed Secondary Dressing T Non-Adherent Dressing, 3x4 in elfa Discharge Instruction: Apply over primary dressing as directed. Secured With 20M Medipore H Soft Cloth Surgical T ape, 4 x 10 (in/yd) Discharge Instruction: Secure with tape as directed. Compression Wrap Compression Stockings Add-Ons Electronic Signature(s) Signed: 11/20/2022 1:56:44 PM By: Dayton Scrape Signed: 11/20/2022 3:03:34 PM By: Samuella Bruin Entered By: Dayton Scrape on 11/20/2022 07:43:27 -------------------------------------------------------------------------------- Vitals Details Patient Name: Date of Service: [name], [name] 11/20/2022 10:45 A M Medical Record Number: [id number] Patient Account Number: [id number] Date of Birth/Sex: Treating RN: [birth year] (21 y.o. F) Primary Care Hines Kloss: Enid Skeens Other Clinician: Referring Shivaay Stormont: Treating Waynetta Metheny/Extender: Arther Dames in Treatment: 25 Vital Signs Time Taken: 10:31 Temperature (F): 97.7 Height (in): 60 Pulse (bpm): 89 Weight (lbs): 380 Respiratory Rate (breaths/min): 18 Body Mass Index (BMI): 74.2 Blood Pressure (mmHg): 163/94 ABIGAHIL, SINHA R ([id number])  [id number] Page 15 of 15 Reference Range: 80 - 120 mg / dl Electronic Signature(s) Signed: 11/20/2022 1:56:44 PM By: Dayton Scrape Entered By: Dayton Scrape on 11/20/2022 07:34:02

## 2022-11-22 ENCOUNTER — Other Ambulatory Visit (HOSPITAL_BASED_OUTPATIENT_CLINIC_OR_DEPARTMENT_OTHER): Payer: Self-pay

## 2022-12-08 ENCOUNTER — Other Ambulatory Visit: Payer: Self-pay | Admitting: Nurse Practitioner

## 2022-12-08 DIAGNOSIS — F424 Excoriation (skin-picking) disorder: Secondary | ICD-10-CM

## 2022-12-08 DIAGNOSIS — M4100 Infantile idiopathic scoliosis, site unspecified: Secondary | ICD-10-CM

## 2022-12-08 DIAGNOSIS — Z6841 Body Mass Index (BMI) 40.0 and over, adult: Secondary | ICD-10-CM

## 2022-12-08 DIAGNOSIS — Q8711 Prader-Willi syndrome: Secondary | ICD-10-CM

## 2022-12-08 DIAGNOSIS — R030 Elevated blood-pressure reading, without diagnosis of hypertension: Secondary | ICD-10-CM

## 2022-12-08 DIAGNOSIS — Z794 Long term (current) use of insulin: Secondary | ICD-10-CM

## 2022-12-09 ENCOUNTER — Other Ambulatory Visit (HOSPITAL_BASED_OUTPATIENT_CLINIC_OR_DEPARTMENT_OTHER): Payer: Self-pay

## 2022-12-12 ENCOUNTER — Other Ambulatory Visit (HOSPITAL_BASED_OUTPATIENT_CLINIC_OR_DEPARTMENT_OTHER): Payer: Self-pay

## 2022-12-22 ENCOUNTER — Encounter (HOSPITAL_BASED_OUTPATIENT_CLINIC_OR_DEPARTMENT_OTHER): Payer: 59 | Attending: General Surgery | Admitting: General Surgery

## 2022-12-22 DIAGNOSIS — L97822 Non-pressure chronic ulcer of other part of left lower leg with fat layer exposed: Secondary | ICD-10-CM | POA: Diagnosis not present

## 2022-12-22 DIAGNOSIS — L98492 Non-pressure chronic ulcer of skin 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: Secondary | ICD-10-CM | POA: Diagnosis not present

## 2022-12-22 DIAGNOSIS — Q8711 Prader-Willi syndrome: Secondary | ICD-10-CM | POA: Diagnosis not present

## 2022-12-22 DIAGNOSIS — Z6841 Body Mass Index (BMI) 40.0 and over, adult: Secondary | ICD-10-CM | POA: Diagnosis not present

## 2022-12-22 DIAGNOSIS — I10 Essential (primary) hypertension: Secondary | ICD-10-CM | POA: Insufficient documentation

## 2022-12-22 DIAGNOSIS — L97812 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osed: Secondary | ICD-10-CM | POA: Insufficient documentation

## 2022-12-22 NOTE — Progress Notes (Addendum)
RAKISHA, PINCOCK ([id number]) [id number] Page 1 of 21 Visit Report for 12/22/2022 Arrival Information Details Patient Name: Date of Service: DO [place], [place] 12/22/2022 10:30 A M Medical Record Number: [id number] Patient Account Number: [id number] Date of Birth/Sex: Treating RN: November 23, 2000 (21 y.o. [name] Primary Care Jaziel Bennett: Enid Skeens Other Clinician: Referring Roena Sassaman: Treating Ardon Franklin/Extender: Arther Dames in Treatment: 30 Visit Information History Since Last Visit Added or deleted any medications: No Patient Arrived: [name] Any new allergies or adverse reactions: No Arrival Time: 10:50 Had a fall or experienced change in No Accompanied By: father activities of daily living that may affect Transfer Assistance: None risk of falls: Patient Identification Verified: Yes Signs or symptoms of abuse/neglect since last visito No Secondary Verification Process Completed: Yes Hospitalized since last visit: No Patient Requires Transmission-Based Precautions: No Implantable device outside of the clinic excluding No cellular tissue based products placed in the center since last visit: Has Dressing in Place as Prescribed: Yes Pain Present Now: No Electronic Signature(s) Signed: 12/22/2022 11:43:56 AM By: Samuella Bruin Entered By: Samuella Bruin on 12/22/2022 08:42:59 -------------------------------------------------------------------------------- Encounter Discharge Information Details Patient Name: Date of Service: DO [name], [name] 12/22/2022 10:30 A M Medical Record Number: [id number] Patient Account Number: [id number] Date of Birth/Sex: Treating RN: February 17, 2001 (21 y.o. [name] Primary Care Boden Stucky: Enid Skeens Other Clinician: Referring Kashis Penley: Treating Nai Borromeo/Extender: Arther Dames in Treatment: 30 Encounter Discharge Information Items Post  Procedure Vitals Discharge Condition: Stable Temperature (F): 97.8 Ambulatory Status: Walker Pulse (bpm): 112 Discharge Destination: Home Respiratory Rate (breaths/min): 18 Transportation: Private Auto Blood Pressure (mmHg): 160/83 Accompanied By: father Schedule Follow-up Appointment: Yes Clinical Summary of Care: Patient Declined Electronic Signature(s) Signed: 12/22/2022 11:43:56 AM By: Samuella Bruin Entered By: Samuella Bruin on 12/22/2022 08:42:53 Derick, Dulce R ([id number]) [id number] Page 2 of 21 -------------------------------------------------------------------------------- Lower Extremity Assessment Details Patient Name: Date of Service: DO [name], [place]. 12/22/2022 10:30 A M Medical Record Number: [id number] Patient Account Number: [id number] Date of Birth/Sex: Treating RN: 03/25/2001 (21 y.o. [name] Primary Care Chestina Komatsu: Enid Skeens Other Clinician: Referring Coby Antrobus: Treating Katalea Ucci/Extender: Arther Dames in Treatment: 30 Electronic Signature(s) Signed: 12/22/2022 11:43:56 AM By: Gelene Mink By: Samuella Bruin on 12/22/2022 08:00:01 -------------------------------------------------------------------------------- Multi Wound Chart Details Patient Name: Date of Service: DO [name], [name] 12/22/2022 10:30 A M Medical Record Number: [id number] Patient Account Number: [id number] Date of Birth/Sex: Treating RN: Jun 22, 2000 (21 y.o. F) Primary Care Dail Meece: Enid Skeens Other Clinician: Referring Lyrica Mcclarty: Treating Shanese Riemenschneider/Extender: Arther Dames in Treatment: 30 Vital Signs Height(in): 60 Pulse(bpm): 112 Weight(lbs): 380 Blood Pressure(mmHg): 160/83 Body Mass Index(BMI): 74.2 Temperature(F): 97.8 Respiratory Rate(breaths/min): 18 [10:Photos: No Photos] [12:No Photos] Left Abdomen - Lower Quadrant Right Abdomen - Upper Quadrant Right,  Lateral Lower Leg Wound Location: Skin Tear/Laceration Trauma Skin Tear/Laceration Wounding Event: Abrasion Abrasion Skin Tear Primary Etiology: Type II Diabetes Type II Diabetes Type II Diabetes Comorbid History: 11/20/2022 11/26/2022 12/22/2022 Date Acquired: 4 0 0 Weeks of Treatment: Healed - Epithelialized Open Open Wound Status: No No No Wound Recurrence: 0x0x0 3x6x0.1 1.4x1.5x0.1 Measurements L x W x D (cm) 0 14.137 1.649 A (cm) : rea 0 1.414 0.165 Volume (cm) : 100.00% N/A N/A % Reduction in A rea: 100.00% N/A N/A % Reduction in Volume: Full Thickness Without Exposed Full Thickness Without Exposed Full Thickness Without Exposed Classification: Support Structures Support Structures  Support Structures Medium Medium Medium Exudate Amount: Serosanguineous Serosanguineous Serosanguineous Exudate Type: red, brown red, brown red, brown Exudate Color: Distinct, outline attached Distinct, outline attached Distinct, outline attached Wound Margin: None Present (0%) Large (67-100%) Small (1-33%) Granulation Amount: N/A Red, Pink Red Granulation Quality: Large (67-100%) Small (1-33%) Large (67-100%) Necrotic Amount: TEMIA, DEBROUX ([id number]) [id number] Page 3 of 21 Eschar Adherent Slough Eschar Necrotic Tissue: Fat Layer (Subcutaneous Tissue): Yes Fat Layer (Subcutaneous Tissue): Yes Fat Layer (Subcutaneous Tissue): Yes Exposed Structures: Fascia: No Fascia: No Fascia: No Tendon: No Tendon: No Tendon: No Muscle: No Muscle: No Muscle: No Joint: No Joint: No Joint: No Bone: No Bone: No Bone: No Large (67-100%) Small (1-33%) Small (1-33%) Epithelialization: N/A Debridement - Selective/Open Wound Debridement - Selective/Open Wound Debridement: Pre-procedure Verification/Time Out N/A 11:10 11:10 Taken: N/A Lidocaine 4% Topical Solution Lidocaine 4% Topical Solution Pain Control: N/A Slough Necrotic/Eschar Tissue Debrided: N/A  Non-Viable Tissue Non-Viable Tissue Level: N/A 14.13 1.65 Debridement A (sq cm): rea N/A Curette Curette Instrument: N/A Minimum Minimum Bleeding: N/A Pressure Pressure Hemostasis A chieved: N/A Procedure was tolerated well Procedure was tolerated well Debridement Treatment Response: N/A 3x6x0.1 1.4x1.5x0.1 Post Debridement Measurements L x W x D (cm) N/A 1.414 0.165 Post Debridement Volume: (cm) No Abnormalities Noted Scarring: Yes No Abnormalities Noted Periwound Skin Texture: No Abnormalities Noted No Abnormalities Noted No Abnormalities Noted Periwound Skin Moisture: No Abnormalities Noted No Abnormalities Noted No Abnormalities Noted Periwound Skin Color: No Abnormality No Abnormality No Abnormality Temperature: N/A Debridement Debridement Procedures Performed: Wound Number: [id number]  Photos: No Photos Left, Anterior Lower Leg Left Hand - Dorsum Right, Anterior Lower Leg Wound Location: Skin T ear/Laceration Skin T ear/Laceration Skin T ear/Laceration Wounding Event: Abrasion Abrasion Abrasion Primary Etiology: Type II Diabetes Type II Diabetes Type II Diabetes Comorbid History: 12/22/2022 09/08/2022 09/25/2022 Date Acquired: 0 13 9 Weeks of Treatment: Open Open Open Wound Status: No No No Wound Recurrence: 0.5x0.5x0.1 0.9x0.9x0.1 0.6x0.8x0.1 Measurements L x W x D (cm) 0.196 0.636 0.377 A (cm) : rea 0.02 0.064 0.038 Volume (cm) : N/A 10.00% 98.70% % Reduction in A rea: N/A 9.90% 98.70% % Reduction in Volume: Full Thickness Without Exposed Full Thickness Without Exposed Full Thickness Without Exposed Classification: Support Structures Support Structures Support Structures Medium Medium Medium Exudate A mount: Serosanguineous Serous Serosanguineous Exudate Type: red, brown amber red, brown Exudate Color: Distinct, outline attached Distinct, outline attached Distinct, outline attached Wound Margin: Small (1-33%) Large (67-100%) Large  (67-100%) Granulation A mount: Red Red, Pink Pink Granulation Quality: Large (67-100%) Small (1-33%) Small (1-33%) Necrotic A mount: Eschar Adherent Slough Eschar, Adherent Slough Necrotic Tissue: Fat Layer (Subcutaneous Tissue): Yes Fat Layer (Subcutaneous Tissue): Yes Fat Layer (Subcutaneous Tissue): Yes Exposed Structures: Fascia: No Fascia: No Fascia: No Tendon: No Tendon: No Tendon: No Muscle: No Muscle: No Muscle: No Joint: No Joint: No Joint: No Bone: No Bone: No Bone: No Small (1-33%) Medium (34-66%) Small (1-33%) Epithelialization: Debridement - Selective/Open Wound Debridement - Selective/Open Wound Debridement - Selective/Open Wound Debridement: Pre-procedure Verification/Time Out 11:10 11:10 11:10 Taken: Lidocaine 4% Topical Solution Lidocaine 4% Topical Solution Lidocaine 4% Topical Solution Pain Control: Necrotic/Eschar, Ambulance person, [employer] Tissue Debrided: Non-Viable Tissue Non-Viable Tissue Non-Viable Tissue Level: 0.2 1.27 0.38 Debridement A (sq cm): rea Curette Curette Curette Instrument: Minimum Minimum Minimum Bleeding: Pressure Pressure Pressure Hemostasis A chieved: Procedure was tolerated well Procedure was tolerated well Procedure was tolerated well Debridement Treatment Response: 0.5x0.5x0.1 0.9x0.9x0.1 0.6x0.8x0.1 Post Debridement Measurements L x W  x D (cm) 0.02 0.064 0.038 Post Debridement Volume: (cm) No Abnormalities Noted No Abnormalities Noted No Abnormalities Noted Periwound Skin Texture: No Abnormalities Noted No Abnormalities Noted No Abnormalities Noted Periwound Skin Moisture: No Abnormalities Noted No Abnormalities Noted Rubor: Yes Periwound Skin ColorDAAIYAH, BAUMERT R ([id number]) [id number] Page 4 of 21 No Abnormality No Abnormality No Abnormality Temperature: Debridement Debridement Debridement Procedures Performed: Wound Number: [id number] N/A Photos: No Photos  N/A Right, Medial Lower Leg Right Hand - Dorsum N/A Wound Location: Skin T ear/Laceration Skin T ear/Laceration N/A Wounding Event: Abrasion Abrasion N/A Primary Etiology: Type II Diabetes Type II Diabetes N/A Comorbid History: 09/25/2022 11/16/2022 N/A Date Acquired: 9 4 N/A Weeks of Treatment: Open Healed - Epithelialized N/A Wound Status: No No N/A Wound Recurrence: 1x2.5x0.1 0x0x0 N/A Measurements L x W x D (cm) 1.963 0 N/A A (cm) : rea 0.196 0 N/A Volume (cm) : 16.70% 100.00% N/A % Reduction in A rea: 16.90% 100.00% N/A % Reduction in Volume: Full Thickness Without Exposed Full Thickness Without Exposed N/A Classification: Support Structures Support Structures Medium None Present N/A Exudate A mount: Serosanguineous N/A N/A Exudate Type: red, brown N/A N/A Exudate Color: Distinct, outline attached Distinct, outline attached N/A Wound Margin: Medium (34-66%) None Present (0%) N/A Granulation A mount: Red N/A N/A Granulation Quality: Medium (34-66%) None Present (0%) N/A Necrotic A mount: Eschar, Adherent Slough N/A N/A Necrotic Tissue: Fat Layer (Subcutaneous Tissue): Yes Fascia: No N/A Exposed Structures: Fascia: No Fat Layer (Subcutaneous Tissue): No Tendon: No Tendon: No Muscle: No Muscle: No Joint: No Joint: No Bone: No Bone: No Small (1-33%) Large (67-100%) N/A Epithelialization: Debridement - Selective/Open Wound N/A N/A Debridement: Pre-procedure Verification/Time Out 11:10 N/A N/A Taken: Lidocaine 4% Topical Solution N/A N/A Pain Control: Necrotic/Eschar, Slough N/A N/A Tissue Debrided: Non-Viable Tissue N/A N/A Level: 1.96 N/A N/A Debridement A (sq cm): rea Curette N/A N/A Instrument: Minimum N/A N/A Bleeding: Pressure N/A N/A Hemostasis A chieved: Procedure was tolerated well N/A N/A Debridement Treatment Response: 1x2.5x0.1 N/A N/A Post Debridement Measurements L x W x D (cm) 0.196 N/A N/A Post Debridement Volume:  (cm) Scarring: No No Abnormalities Noted N/A Periwound Skin Texture: No Abnormalities Noted No Abnormalities Noted N/A Periwound Skin Moisture: No Abnormalities Noted No Abnormalities Noted N/A Periwound Skin Color: No Abnormality No Abnormality N/A Temperature: Debridement N/A N/A Procedures Performed: Treatment Notes Wound #10 (Abdomen - Lower Quadrant) Wound Laterality: Left Cleanser Peri-Wound Care Topical Primary Dressing Secondary Dressing Secured With Compression Wrap Compression Stockings Add-Ons SARA, KEYS ([id number]) [id number] Page 5 of 21 Wound #11 (Abdomen - Upper Quadrant) Wound Laterality: Right Cleanser Soap and Water Discharge Instruction: May shower and wash wound with dial antibacterial soap and water prior to dressing change. Wound Cleanser Discharge Instruction: Cleanse the wound with wound cleanser prior to applying a clean dressing using gauze sponges, not tissue or cotton balls. Peri-Wound Care Topical Mupirocin Ointment Discharge Instruction: Apply Mupirocin (Bactroban) as instructed Primary Dressing Maxorb Extra Ag+ Alginate Dressing, 2x2 (in/in) Discharge Instruction: Apply to wound bed as instructed Secondary Dressing T Non-Adherent Dressing, 3x4 in elfa Discharge Instruction: Apply over primary dressing as directed. Woven Gauze Sponge, Non-Sterile 4x4 in Discharge Instruction: Apply over primary dressing as directed. Secured With Paper Tape, 2x10 (in/yd) Discharge Instruction: Secure dressing with tape as directed. Compression Wrap Compression Stockings Add-Ons Wound #12 (Lower Leg) Wound Laterality: Right, Lateral Cleanser Soap and Water Discharge Instruction: May shower and wash wound with dial antibacterial soap and water prior to dressing  change. Wound Cleanser Discharge Instruction: Cleanse the wound with wound cleanser prior to applying a clean dressing using gauze sponges, not tissue or cotton  balls. Peri-Wound Care Topical Mupirocin Ointment Discharge Instruction: Apply Mupirocin (Bactroban) as instructed Primary Dressing Maxorb Extra Ag+ Alginate Dressing, 2x2 (in/in) Discharge Instruction: Apply to wound bed as instructed Secondary Dressing T Non-Adherent Dressing, 3x4 in elfa Discharge Instruction: Apply over primary dressing as directed. Woven Gauze Sponge, Non-Sterile 4x4 in Discharge Instruction: Apply over primary dressing as directed. Secured With Paper Tape, 2x10 (in/yd) Discharge Instruction: Secure dressing with tape as directed. Compression Wrap Compression Stockings Add-Ons Wound #13 (Lower Leg) Wound Laterality: Left, Anterior Cleanser Soap and Water Discharge Instruction: May shower and wash wound with dial antibacterial soap and water prior to dressing change. NIMISHA, RATHEL ([id number]) [id number] Page 6 of 21 Wound Cleanser Discharge Instruction: Cleanse the wound with wound cleanser prior to applying a clean dressing using gauze sponges, not tissue or cotton balls. Peri-Wound Care Topical Mupirocin Ointment Discharge Instruction: Apply Mupirocin (Bactroban) as instructed Primary Dressing Maxorb Extra Ag+ Alginate Dressing, 2x2 (in/in) Discharge Instruction: Apply to wound bed as instructed Secondary Dressing T Non-Adherent Dressing, 3x4 in elfa Discharge Instruction: Apply over primary dressing as directed. Woven Gauze Sponge, Non-Sterile 4x4 in Discharge Instruction: Apply over primary dressing as directed. Secured With Paper Tape, 2x10 (in/yd) Discharge Instruction: Secure dressing with tape as directed. Compression Wrap Compression Stockings Add-Ons Wound #3 (Hand - Dorsum) Wound Laterality: Left Cleanser Soap and Water Discharge Instruction: May shower and wash wound with dial antibacterial soap and water prior to dressing change. Wound Cleanser Discharge Instruction: Cleanse the wound with wound  cleanser prior to applying a clean dressing using gauze sponges, not tissue or cotton balls. Peri-Wound Care Topical Mupirocin Ointment Discharge Instruction: Apply Mupirocin (Bactroban) as instructed Primary Dressing Maxorb Extra Ag+ Alginate Dressing, 2x2 (in/in) Discharge Instruction: Apply to wound bed as instructed Secondary Dressing T Non-Adherent Dressing, 3x4 in elfa Discharge Instruction: Apply over primary dressing as directed. Woven Gauze Sponge, Non-Sterile 4x4 in Discharge Instruction: Apply over primary dressing as directed. Secured With Paper Tape, 2x10 (in/yd) Discharge Instruction: Secure dressing with tape as directed. Compression Wrap Compression Stockings Add-Ons Wound #7 (Lower Leg) Wound Laterality: Right, Anterior Cleanser Soap and Water Discharge Instruction: May shower and wash wound with dial antibacterial soap and water prior to dressing change. Wound Cleanser Discharge Instruction: Cleanse the wound with wound cleanser prior to applying a clean dressing using gauze sponges, not tissue or cotton balls. Peri-Wound Care Topical JULANNE, SCHLUETER R ([id number]) [id number] Page 7 of 21 Mupirocin Ointment Discharge Instruction: Apply Mupirocin (Bactroban) as instructed Primary Dressing Maxorb Extra Ag+ Alginate Dressing, 2x2 (in/in) Discharge Instruction: Apply to wound bed as instructed Secondary Dressing T Non-Adherent Dressing, 3x4 in elfa Discharge Instruction: Apply over primary dressing as directed. Woven Gauze Sponge, Non-Sterile 4x4 in Discharge Instruction: Apply over primary dressing as directed. Secured With Paper Tape, 2x10 (in/yd) Discharge Instruction: Secure dressing with tape as directed. Compression Wrap Compression Stockings Add-Ons Wound #8 (Lower Leg) Wound Laterality: Right, Medial Cleanser Soap and Water Discharge Instruction: May shower and wash wound with dial antibacterial soap and water prior to  dressing change. Wound Cleanser Discharge Instruction: Cleanse the wound with wound cleanser prior to applying a clean dressing using gauze sponges, not tissue or cotton balls. Peri-Wound Care Topical Mupirocin Ointment Discharge Instruction: Apply Mupirocin (Bactroban) as instructed Primary Dressing Maxorb Extra Ag+ Alginate Dressing, 2x2 (in/in) Discharge Instruction: Apply to wound bed as instructed Secondary  Dressing T Non-Adherent Dressing, 3x4 in elfa Discharge Instruction: Apply over primary dressing as directed. Woven Gauze Sponge, Non-Sterile 4x4 in Discharge Instruction: Apply over primary dressing as directed. Secured With Paper Tape, 2x10 (in/yd) Discharge Instruction: Secure dressing with tape as directed. Compression Wrap Compression Stockings Add-Ons Wound #9 (Hand - Dorsum) Wound Laterality: Right Cleanser Peri-Wound Care Topical Primary Dressing Secondary Dressing Secured With Compression Wrap Compression Stockings Add-Ons YOCELIN, VANLUE ([id number]) [id number] Page 8 of 21 Electronic Signature(s) Signed: 12/22/2022 11:52:59 AM By: Duanne Guess MD FACS Entered By: Duanne Guess on 12/22/2022 08:52:58 -------------------------------------------------------------------------------- Multi-Disciplinary Care Plan Details Patient Name: Date of Service: DO [name], [name] 12/22/2022 10:30 A M Medical Record Number: [id number] Patient Account Number: [id number] Date of Birth/Sex: Treating RN: 16-May-2000 (21 y.o. [name] Primary Care Remy Dia: Enid Skeens Other Clinician: Referring Danetta Prom: Treating Novalyn Lajara/Extender: Arther Dames in Treatment: 30 Active Inactive Necrotic Tissue Nursing Diagnoses: Impaired tissue integrity related to necrotic/devitalized tissue Knowledge deficit related to management of necrotic/devitalized tissue Goals: Necrotic/devitalized tissue will be  minimized in the wound bed Date Initiated: 05/23/2022 Target Resolution Date: 01/26/2023 Goal Status: Active Patient/caregiver will verbalize understanding of reason and process for debridement of necrotic tissue Date Initiated: 05/23/2022 Target Resolution Date: 01/26/2023 Goal Status: Active Interventions: Assess patient pain level pre-, during and post procedure and prior to discharge Provide education on necrotic tissue and debridement process Treatment Activities: Apply topical anesthetic as ordered : 05/23/2022 Notes: Wound/Skin Impairment Nursing Diagnoses: Impaired tissue integrity Knowledge deficit related to ulceration/compromised skin integrity Goals: Patient/caregiver will verbalize understanding of skin care regimen Date Initiated: 05/23/2022 Target Resolution Date: 01/26/2023 Goal Status: Active Interventions: Assess patient/caregiver ability to perform ulcer/skin care regimen upon admission and as needed Assess ulceration(s) every visit Treatment Activities: Skin care regimen initiated : 05/23/2022 Topical wound management initiated : 05/23/2022 Notes: Electronic Signature(s) Signed: 12/22/2022 11:43:56 AM By: Samuella Bruin Entered By: Samuella Bruin on 12/22/2022 08:41:05 Richardson, Daquana R ([id number]) [id number] Page 9 of 21 -------------------------------------------------------------------------------- Pain Assessment Details Patient Name: Date of Service: DO [name], [place] R. 12/22/2022 10:30 A M Medical Record Number: [id number] Patient Account Number: [id number] Date of Birth/Sex: Treating RN: 2000-10-15 (21 y.o. [name] Primary Care Dorion Petillo: Enid Skeens Other Clinician: Referring Tevin Shillingford: Treating Chloe Miyoshi/Extender: Arther Dames in Treatment: 30 Active Problems Location of Pain Severity and Description of Pain Patient Has Paino No Site Locations Rate the pain. Current Pain Level:  0 Pain Management and Medication Current Pain Management: Electronic Signature(s) Signed: 12/22/2022 11:43:56 AM By: Samuella Bruin Entered By: Samuella Bruin on 12/22/2022 07:52:31 -------------------------------------------------------------------------------- Patient/Caregiver Education Details Patient Name: Date of Service: DO [name], Rolene Arbour 9/27/2024andnbsp10:30 A M Medical Record Number: [id number] Patient Account Number: [id number] Date of Birth/Gender: Treating RN: 06/17/2000 (21 y.o. [name] Primary Care Physician: Enid Skeens Other Clinician: Referring Physician: Treating Physician/Extender: Arther Dames in Treatment: 30 Education Assessment Education Provided To: Patient Education Topics Provided Wound/Skin Impairment: Methods: Explain/Verbal Responses: Reinforcements needed, State content correctly [place] ([id number]) [id number] Page 10 of 21 Electronic Signature(s) Signed: 12/22/2022 11:43:56 AM By: Gelene Mink By: Samuella Bruin on 12/22/2022 08:41:58 -------------------------------------------------------------------------------- Wound Assessment Details Patient Name: Date of Service: DO [name], [name] 12/22/2022 10:30 A M Medical Record Number: [id number] Patient Account Number: [id number] Date of Birth/Sex: Treating RN: [birth year] (21 y.o. [name] Primary Care Jalaine Riggenbach: Enid Skeens Other Clinician: Referring Shanita Kanan: Treating  Ladawn Boullion/Extender: Arther Dames in Treatment: 30 Wound Status Wound Number: 10 Primary Etiology: Abrasion Wound Location: Left Abdomen - Lower Quadrant Wound Status: Healed - Epithelialized Wounding Event: Skin Tear/Laceration Comorbid History: Type II Diabetes Date Acquired: 11/20/2022 Weeks Of Treatment: 4 Clustered Wound: No Wound Measurements Length: (cm) Width: (cm) Depth:  (cm) Area: (cm) Volume: (cm) 0 % Reduction in Area: 100% 0 % Reduction in Volume: 100% 0 Epithelialization: Large (67-100%) 0 Tunneling: No 0 Undermining: No Wound Description Classification: Full Thickness Without Exposed Suppor Wound Margin: Distinct, outline attached Exudate Amount: Medium Exudate Type: Serosanguineous Exudate Color: red, brown t Structures Foul Odor After Cleansing: No Slough/Fibrino No Wound Bed Granulation Amount: None Present (0%) Exposed Structure Necrotic Amount: Large (67-100%) Fascia Exposed: No Necrotic Quality: Eschar Fat Layer (Subcutaneous Tissue) Exposed: Yes Tendon Exposed: No Muscle Exposed: No Joint Exposed: No Bone Exposed: No Periwound Skin Texture Texture Color No Abnormalities Noted: Yes No Abnormalities Noted: Yes Moisture Temperature / Pain No Abnormalities Noted: Yes Temperature: No Abnormality Treatment Notes Wound #10 (Abdomen - Lower Quadrant) Wound Laterality: Left Cleanser Peri-Wound Care Topical Primary Dressing Secondary Dressing Secured With LYNNETTE, POTE ([id number]) [id number] Page 11 of 21 Compression Wrap Compression Stockings Add-Ons Electronic Signature(s) Signed: 12/22/2022 11:43:56 AM By: Samuella Bruin Entered By: Samuella Bruin on 12/22/2022 08:15:41 -------------------------------------------------------------------------------- Wound Assessment Details Patient Name: Date of Service: DO [name], [name] 12/22/2022 10:30 A M Medical Record Number: [id number] Patient Account Number: [id number] Date of Birth/Sex: Treating RN: Jun 15, 2000 (21 y.o. [name] Primary Care Altagracia Rone: Enid Skeens Other Clinician: Referring Talyah Seder: Treating Donnita Farina/Extender: Arther Dames in Treatment: 30 Wound Status Wound Number: 11 Primary Etiology: Abrasion Wound Location: Right Abdomen - Upper Quadrant Wound Status: Open Wounding Event:  Trauma Comorbid History: Type II Diabetes Date Acquired: 11/26/2022 Weeks Of Treatment: 0 Clustered Wound: No Photos Wound Measurements Length: (cm) 3 Width: (cm) 6 Depth: (cm) 0.1 Area: (cm) 14.137 Volume: (cm) 1.414 % Reduction in Area: % Reduction in Volume: Epithelialization: Small (1-33%) Tunneling: No Undermining: No Wound Description Classification: Full Thickness Without Exposed Suppor Wound Margin: Distinct, outline attached Exudate Amount: Medium Exudate Type: Serosanguineous Exudate Color: red, brown t Structures Foul Odor After Cleansing: No Slough/Fibrino Yes Wound Bed Granulation Amount: Large (67-100%) Exposed Structure Granulation Quality: Red, Pink Fascia Exposed: No Necrotic Amount: Small (1-33%) Fat Layer (Subcutaneous Tissue) Exposed: Yes Necrotic Quality: Adherent Slough Tendon Exposed: No Muscle Exposed: No Joint Exposed: No Bone Exposed: No [address] [place]DO[place], Devra R ([id number]) [id number] Page 12 of 21 No Abnormalities Noted: No No Abnormalities Noted: Yes Scarring: Yes Temperature / Pain Temperature: No Abnormality Moisture No Abnormalities Noted: Yes Treatment Notes Wound #11 (Abdomen - Upper Quadrant) Wound Laterality: Right Cleanser Soap and Water Discharge Instruction: May shower and wash wound with dial antibacterial soap and water prior to dressing change. Wound Cleanser Discharge Instruction: Cleanse the wound with wound cleanser prior to applying a clean dressing using gauze sponges, not tissue or cotton balls. Peri-Wound Care Topical Mupirocin Ointment Discharge Instruction: Apply Mupirocin (Bactroban) as instructed Primary Dressing Maxorb Extra Ag+ Alginate Dressing, 2x2 (in/in) Discharge Instruction: Apply to wound bed as instructed Secondary Dressing T Non-Adherent Dressing, 3x4 in elfa Discharge Instruction: Apply over primary dressing as directed. Woven Gauze  Sponge, Non-Sterile 4x4 in Discharge Instruction: Apply over primary dressing as directed. Secured With Paper Tape, 2x10 (in/yd) Discharge Instruction: Secure dressing with tape as directed. Compression Wrap Compression Stockings Add-Ons Electronic Signature(s)  Signed: 12/22/2022 11:43:56 AM By: Gelene Mink By: Samuella Bruin on 12/22/2022 08:03:26 -------------------------------------------------------------------------------- Wound Assessment Details Patient Name: Date of Service: DO [name], [name] 12/22/2022 10:30 A M Medical Record Number: [id number] Patient Account Number: [id number] Date of Birth/Sex: Treating RN: 05-22-2000 (21 y.o. [name] Primary Care Drayson Dorko: Enid Skeens Other Clinician: Referring Tristin Gladman: Treating Mariana Goytia/Extender: Arther Dames in Treatment: 30 Wound Status Wound Number: 12 Primary Etiology: Skin Tear Wound Location: Right, Lateral Lower Leg Wound Status: Open Wounding Event: Skin Tear/Laceration Comorbid History: Type II Diabetes Date Acquired: 12/22/2022 Weeks Of Treatment: 0 Clustered Wound: No Wound Measurements Length: (cm) 1.4 Width: (cm) 1.5 Depth: (cm) 0.1 Rybarczyk, Mylee R ([id number]) Area: (cm) 1.649 Volume: (cm) 0.165 % Reduction in Area: % Reduction in Volume: Epithelialization: Small (1-33%) [id number].pdf Page 13 of 21 Tunneling: No Undermining: No Wound Description Classification: Full Thickness Without Exposed Support Structures Wound Margin: Distinct, outline attached Exudate Amount: Medium Exudate Type: Serosanguineous Exudate Color: red, brown Foul Odor After Cleansing: No Slough/Fibrino No Wound Bed Granulation Amount: Small (1-33%) Exposed Structure Granulation Quality: Red Fascia Exposed: No Necrotic Amount: Large (67-100%) Fat Layer (Subcutaneous Tissue) Exposed: Yes Necrotic Quality: Eschar Tendon Exposed: No Muscle  Exposed: No Joint Exposed: No Bone Exposed: No Periwound Skin Texture Texture Color No Abnormalities Noted: Yes No Abnormalities Noted: Yes Moisture Temperature / Pain No Abnormalities Noted: Yes Temperature: No Abnormality Treatment Notes Wound #12 (Lower Leg) Wound Laterality: Right, Lateral Cleanser Soap and Water Discharge Instruction: May shower and wash wound with dial antibacterial soap and water prior to dressing change. Wound Cleanser Discharge Instruction: Cleanse the wound with wound cleanser prior to applying a clean dressing using gauze sponges, not tissue or cotton balls. Peri-Wound Care Topical Mupirocin Ointment Discharge Instruction: Apply Mupirocin (Bactroban) as instructed Primary Dressing Maxorb Extra Ag+ Alginate Dressing, 2x2 (in/in) Discharge Instruction: Apply to wound bed as instructed Secondary Dressing T Non-Adherent Dressing, 3x4 in elfa Discharge Instruction: Apply over primary dressing as directed. Woven Gauze Sponge, Non-Sterile 4x4 in Discharge Instruction: Apply over primary dressing as directed. Secured With Paper Tape, 2x10 (in/yd) Discharge Instruction: Secure dressing with tape as directed. Compression Wrap Compression Stockings Add-Ons Electronic Signature(s) Signed: 12/22/2022 11:43:56 AM By: Samuella Bruin Entered By: Samuella Bruin on 12/22/2022 08:14:08 Mitcham, Nai R ([id number]) [id number] Page 14 of 21 -------------------------------------------------------------------------------- Wound Assessment Details Patient Name: Date of Service: DO [name], [place]. 12/22/2022 10:30 A M Medical Record Number: [id number] Patient Account Number: [id number] Date of Birth/Sex: Treating RN: [birth year] (21 y.o. [name] Primary Care Tinsley Lomas: Enid Skeens Other Clinician: Referring Brandon Wiechman: Treating Vanderbilt Ranieri/Extender: Arther Dames in Treatment: 30 Wound  Status Wound Number: 13 Primary Etiology: Abrasion Wound Location: Left, Anterior Lower Leg Wound Status: Open Wounding Event: Skin Tear/Laceration Comorbid History: Type II Diabetes Date Acquired: 12/22/2022 Weeks Of Treatment: 0 Clustered Wound: No Wound Measurements Length: (cm) 0.5 Width: (cm) 0.5 Depth: (cm) 0.1 Area: (cm) 0.196 Volume: (cm) 0.02 % Reduction in Area: % Reduction in Volume: Epithelialization: Small (1-33%) Tunneling: No Undermining: No Wound Description Classification: Full Thickness Without Exposed Support Structures Wound Margin: Distinct, outline attached Exudate Amount: Medium Exudate Type: Serosanguineous Exudate Color: red, brown Foul Odor After Cleansing: No Slough/Fibrino No Wound Bed Granulation Amount: Small (1-33%) Exposed Structure Granulation Quality: Red Fascia Exposed: No Necrotic Amount: Large (67-100%) Fat Layer (Subcutaneous Tissue) Exposed: Yes Necrotic Quality: Eschar Tendon Exposed: No Muscle Exposed: No Joint Exposed: No Bone Exposed: No  Periwound Skin Texture Texture Color No Abnormalities Noted: Yes No Abnormalities Noted: Yes Moisture Temperature / Pain No Abnormalities Noted: Yes Temperature: No Abnormality Treatment Notes Wound #13 (Lower Leg) Wound Laterality: Left, Anterior Cleanser Soap and Water Discharge Instruction: May shower and wash wound with dial antibacterial soap and water prior to dressing change. Wound Cleanser Discharge Instruction: Cleanse the wound with wound cleanser prior to applying a clean dressing using gauze sponges, not tissue or cotton balls. Peri-Wound Care Topical Mupirocin Ointment Discharge Instruction: Apply Mupirocin (Bactroban) as instructed Primary Dressing Maxorb Extra Ag+ Alginate Dressing, 2x2 (in/in) Discharge Instruction: Apply to wound bed as instructed Secondary Dressing T Non-Adherent Dressing, 3x4 in elfa Discharge Instruction: Apply over primary dressing as  directed. Woven Gauze Sponge, Non-Sterile 4x4 in Discharge Instruction: Apply over primary dressing as directed. HASINA, KREAGER ([id number]) [id number] Page 15 of 21 Secured With Paper Tape, 2x10 (in/yd) Discharge Instruction: Secure dressing with tape as directed. Compression Wrap Compression Stockings Add-Ons Electronic Signature(s) Signed: 12/22/2022 11:43:56 AM By: Samuella Bruin Entered By: Samuella Bruin on 12/22/2022 08:17:58 -------------------------------------------------------------------------------- Wound Assessment Details Patient Name: Date of Service: DO [name], [name] 12/22/2022 10:30 A M Medical Record Number: [id number] Patient Account Number: [id number] Date of Birth/Sex: Treating RN: [birth year] (21 y.o. [name] Primary Care Robson Trickey: Enid Skeens Other Clinician: Referring Tira Lafferty: Treating Sanye Ledesma/Extender: Arther Dames in Treatment: 30 Wound Status Wound Number: 3 Primary Etiology: Abrasion Wound Location: Left Hand - Dorsum Wound Status: Open Wounding Event: Skin Tear/Laceration Comorbid History: Type II Diabetes Date Acquired: 09/08/2022 Weeks Of Treatment: 13 Clustered Wound: No Photos Wound Measurements Length: (cm) 0.9 Width: (cm) 0.9 Depth: (cm) 0.1 Area: (cm) 0.636 Volume: (cm) 0.064 % Reduction in Area: 10% % Reduction in Volume: 9.9% Epithelialization: Medium (34-66%) Tunneling: No Undermining: No Wound Description Classification: Full Thickness Without Exposed Suppor Wound Margin: Distinct, outline attached Exudate Amount: Medium Exudate Type: Serous Exudate Color: amber t Structures Foul Odor After Cleansing: No Slough/Fibrino Yes Wound Bed Granulation Amount: Large (67-100%) Exposed Structure Granulation Quality: Red, Pink Fascia Exposed: No Necrotic Amount: Small (1-33%) Fat Layer (Subcutaneous Tissue) Exposed: Yes Necrotic Quality: Adherent  Slough Tendon Exposed: No Muscle Exposed: No Joint Exposed: No CINDERELLA, CHRISTOFFERSEN R ([id number]) [id number] Page 16 of 21 Bone Exposed: No Periwound Skin Texture Texture Color No Abnormalities Noted: Yes No Abnormalities Noted: Yes Moisture Temperature / Pain No Abnormalities Noted: Yes Temperature: No Abnormality Treatment Notes Wound #3 (Hand - Dorsum) Wound Laterality: Left Cleanser Soap and Water Discharge Instruction: May shower and wash wound with dial antibacterial soap and water prior to dressing change. Wound Cleanser Discharge Instruction: Cleanse the wound with wound cleanser prior to applying a clean dressing using gauze sponges, not tissue or cotton balls. Peri-Wound Care Topical Mupirocin Ointment Discharge Instruction: Apply Mupirocin (Bactroban) as instructed Primary Dressing Maxorb Extra Ag+ Alginate Dressing, 2x2 (in/in) Discharge Instruction: Apply to wound bed as instructed Secondary Dressing T Non-Adherent Dressing, 3x4 in elfa Discharge Instruction: Apply over primary dressing as directed. Woven Gauze Sponge, Non-Sterile 4x4 in Discharge Instruction: Apply over primary dressing as directed. Secured With Paper Tape, 2x10 (in/yd) Discharge Instruction: Secure dressing with tape as directed. Compression Wrap Compression Stockings Add-Ons Electronic Signature(s) Signed: 12/22/2022 11:43:56 AM By: Samuella Bruin Entered By: Samuella Bruin on 12/22/2022 08:03:48 -------------------------------------------------------------------------------- Wound Assessment Details Patient Name: Date of Service: DO [name], [name] 12/22/2022 10:30 A M Medical Record Number: [id number] Patient Account Number: [id number] Date of Birth/Sex: Treating RN:  [birth year] (21 y.o. [name] Primary Care Lore Polka: Enid Skeens Other Clinician: Referring Berl Bonfanti: Treating Ian Castagna/Extender: Arther Dames in  Treatment: 30 Wound Status Wound Number: 7 Primary Etiology: Abrasion Wound Location: Right, Anterior Lower Leg Wound Status: Open Wounding Event: Skin Tear/Laceration Comorbid History: Type II Diabetes Date Acquired: 09/25/2022 Weeks Of Treatment: 9 Clustered Wound: No Photos EMILYANNE, MCGOUGH ([id number]) [id number] Page 17 of 21 Wound Measurements Length: (cm) 0.6 Width: (cm) 0.8 Depth: (cm) 0.1 Area: (cm) 0.377 Volume: (cm) 0.038 % Reduction in Area: 98.7% % Reduction in Volume: 98.7% Epithelialization: Small (1-33%) Tunneling: No Undermining: No Wound Description Classification: Full Thickness Without Exposed Support Structures Wound Margin: Distinct, outline attached Exudate Amount: Medium Exudate Type: Serosanguineous Exudate Color: red, brown Foul Odor After Cleansing: No Slough/Fibrino Yes Wound Bed Granulation Amount: Large (67-100%) Exposed Structure Granulation Quality: Pink Fascia Exposed: No Necrotic Amount: Small (1-33%) Fat Layer (Subcutaneous Tissue) Exposed: Yes Necrotic Quality: Eschar, Adherent Slough Tendon Exposed: No Muscle Exposed: No Joint Exposed: No Bone Exposed: No Periwound Skin Texture Texture Color No Abnormalities Noted: Yes No Abnormalities Noted: No Rubor: Yes Moisture No Abnormalities Noted: Yes Temperature / Pain Temperature: No Abnormality Treatment Notes Wound #7 (Lower Leg) Wound Laterality: Right, Anterior Cleanser Soap and Water Discharge Instruction: May shower and wash wound with dial antibacterial soap and water prior to dressing change. Wound Cleanser Discharge Instruction: Cleanse the wound with wound cleanser prior to applying a clean dressing using gauze sponges, not tissue or cotton balls. Peri-Wound Care Topical Mupirocin Ointment Discharge Instruction: Apply Mupirocin (Bactroban) as instructed Primary Dressing Maxorb Extra Ag+ Alginate Dressing, 2x2 (in/in) Discharge  Instruction: Apply to wound bed as instructed Secondary Dressing T Non-Adherent Dressing, 3x4 in elfa Discharge Instruction: Apply over primary dressing as directed. Woven Gauze Sponge, Non-Sterile 4x4 in Discharge Instruction: Apply over primary dressing as directed. Secured With Paper Tape, 2x10 (in/yd) Discharge Instruction: Secure dressing with tape as directed. VANISHA, WHITEN ([id number]) [id number] Page 18 of 21 Compression Wrap Compression Stockings Add-Ons Electronic Signature(s) Signed: 12/22/2022 11:43:56 AM By: Samuella Bruin Entered By: Samuella Bruin on 12/22/2022 08:04:13 -------------------------------------------------------------------------------- Wound Assessment Details Patient Name: Date of Service: DO [name], [name] 12/22/2022 10:30 A M Medical Record Number: [id number] Patient Account Number: [id number] Date of Birth/Sex: Treating RN: [birth year] (21 y.o. [name] Primary Care Valisa Karpel: Enid Skeens Other Clinician: Referring Amere Iott: Treating Danija Gosa/Extender: Arther Dames in Treatment: 30 Wound Status Wound Number: 8 Primary Etiology: Abrasion Wound Location: Right, Medial Lower Leg Wound Status: Open Wounding Event: Skin Tear/Laceration Comorbid History: Type II Diabetes Date Acquired: 09/25/2022 Weeks Of Treatment: 9 Clustered Wound: No Photos Wound Measurements Length: (cm) 1 Width: (cm) 2.5 Depth: (cm) 0.1 Area: (cm) 1.963 Volume: (cm) 0.196 % Reduction in Area: 16.7% % Reduction in Volume: 16.9% Epithelialization: Small (1-33%) Tunneling: No Undermining: No Wound Description Classification: Full Thickness Without Exposed Suppor Wound Margin: Distinct, outline attached Exudate Amount: Medium Exudate Type: Serosanguineous Exudate Color: red, brown t Structures Foul Odor After Cleansing: No Slough/Fibrino Yes Wound Bed Granulation Amount: Medium (34-66%)  Exposed Structure Granulation Quality: Red Fascia Exposed: No Necrotic Amount: Medium (34-66%) Fat Layer (Subcutaneous Tissue) Exposed: Yes Necrotic Quality: Eschar, Adherent Slough Tendon Exposed: No Muscle Exposed: No Joint Exposed: No Bone Exposed: No [address] SIEH, Philicia R ([id number]) [id number] Page 19 of 21 No Abnormalities Noted: No No Abnormalities Noted: Yes Scarring: No Temperature / Pain Temperature: No Abnormality Moisture No Abnormalities Noted:  Yes Treatment Notes Wound #8 (Lower Leg) Wound Laterality: Right, Medial Cleanser Soap and Water Discharge Instruction: May shower and wash wound with dial antibacterial soap and water prior to dressing change. Wound Cleanser Discharge Instruction: Cleanse the wound with wound cleanser prior to applying a clean dressing using gauze sponges, not tissue or cotton balls. Peri-Wound Care Topical Mupirocin Ointment Discharge Instruction: Apply Mupirocin (Bactroban) as instructed Primary Dressing Maxorb Extra Ag+ Alginate Dressing, 2x2 (in/in) Discharge Instruction: Apply to wound bed as instructed Secondary Dressing T Non-Adherent Dressing, 3x4 in elfa Discharge Instruction: Apply over primary dressing as directed. Woven Gauze Sponge, Non-Sterile 4x4 in Discharge Instruction: Apply over primary dressing as directed. Secured With Paper Tape, 2x10 (in/yd) Discharge Instruction: Secure dressing with tape as directed. Compression Wrap Compression Stockings Add-Ons Electronic Signature(s) Signed: 12/22/2022 11:43:56 AM By: Samuella Bruin Entered By: Samuella Bruin on 12/22/2022 08:04:39 -------------------------------------------------------------------------------- Wound Assessment Details Patient Name: Date of Service: DO [name], [name] 12/22/2022 10:30 A M Medical Record Number: [id number] Patient Account Number: [id number] Date of Birth/Sex:  Treating RN: Jul 19, 2000 (21 y.o. [name] Primary Care Emmy Keng: Enid Skeens Other Clinician: Referring Claudy Abdallah: Treating Satoya Feeley/Extender: Arther Dames in Treatment: 30 Wound Status Wound Number: 9 Primary Etiology: Abrasion Wound Location: Right Hand - Dorsum Wound Status: Healed - Epithelialized Wounding Event: Skin Tear/Laceration Comorbid History: Type II Diabetes Date Acquired: 11/16/2022 Weeks Of Treatment: 4 Clustered Wound: No Wound Measurements Length: (cm) Width: (cm) Depth: (cm) Witcher, Idalys R ([id number]) Area: (cm) Volume: (cm) 0 % Reduction in Area: 100% 0 % Reduction in Volume: 100% 0 Epithelialization: Large (67-100%) [id number] Page 20 of 21 0 Tunneling: No 0 Undermining: No Wound Description Classification: Full Thickness Without Exposed Support Structures Wound Margin: Distinct, outline attached Exudate Amount: None Present Foul Odor After Cleansing: No Slough/Fibrino No Wound Bed Granulation Amount: None Present (0%) Exposed Structure Necrotic Amount: None Present (0%) Fascia Exposed: No Fat Layer (Subcutaneous Tissue) Exposed: No Tendon Exposed: No Muscle Exposed: No Joint Exposed: No Bone Exposed: No Periwound Skin Texture Texture Color No Abnormalities Noted: Yes No Abnormalities Noted: Yes Moisture Temperature / Pain No Abnormalities Noted: Yes Temperature: No Abnormality Treatment Notes Wound #9 (Hand - Dorsum) Wound Laterality: Right Cleanser Peri-Wound Care Topical Primary Dressing Secondary Dressing Secured With Compression Wrap Compression Stockings Add-Ons Electronic Signature(s) Signed: 12/22/2022 11:43:56 AM By: Samuella Bruin Entered By: Samuella Bruin on 12/22/2022 08:17:04 -------------------------------------------------------------------------------- Vitals Details Patient Name: Date of Service: DO [name], [name] 12/22/2022 10:30 A  M Medical Record Number: [id number] Patient Account Number: [id number] Date of Birth/Sex: Treating RN: February 08, 2001 (21 y.o. [name] Primary Care Chelan Heringer: Enid Skeens Other Clinician: Referring Satin Boal: Treating Silvestre Mines/Extender: Arther Dames in Treatment: 30 Vital Signs Time Taken: 10:52 Temperature (F): 97.8 Height (in): 60 Pulse (bpm): 112 Weight (lbs): 380 Respiratory Rate (breaths/min): 18 Body Mass Index (BMI): 74.2 Blood Pressure (mmHg): 160/83 Reference Range: 80 - 120 mg / dl Electronic Signature(s) Signed: 12/22/2022 11:43:56 AM By: Marshell Levan, Tatelyn R ([id number]) [email or web address].pdf Page 21 of 21 Signed: 12/22/2022 11:43:56 AM aylor Entered By: Samuella Bruin on 12/22/2022 07:53:36

## 2022-12-25 NOTE — Progress Notes (Signed)
[name], [name] ([id number]) [id number] Page 1 of 16 Visit Report for 12/22/2022 Chief Complaint Document Details Patient Name: Date of Service: DO [place], [place] 12/22/2022 10:30 A M Medical Record Number: [id number] Patient Account Number: [id number] Date of Birth/Sex: Treating RN: Aug 05, 2000 (21 y.o. F) Primary Care Provider: Enid Skeens Other Clinician: Referring Provider: Treating Provider/Extender: Arther Dames in Treatment: 30 Information Obtained from: Patient Chief Complaint Patient seen for complaints of Non-Healing Wound. Electronic Signature(s) Signed: 12/22/2022 11:53:17 AM By: Duanne Guess MD FACS Entered By: Duanne Guess on 12/22/2022 08:53:17 -------------------------------------------------------------------------------- Debridement Details Patient Name: Date of Service: [name], MA [name]. 12/22/2022 10:30 A M Medical Record Number: [id number] Patient Account Number: [id number] Date of Birth/Sex: Treating RN: 2000-08-03 (21 y.o. [name] Primary Care Provider: Enid Skeens Other Clinician: Referring Provider: Treating Provider/Extender: Arther Dames in Treatment: 30 Debridement Performed for Assessment: Wound #11 Right Abdomen - Upper Quadrant Performed By: Physician Duanne Guess, MD The following information was scribed by: Samuella Bruin The information was scribed for: Duanne Guess Debridement Type: Debridement Level of Consciousness (Pre-procedure): Awake and Alert Pre-procedure Verification/Time Out Yes - 11:10 Taken: Start Time: 11:10 Pain Control: Lidocaine 4% T opical Solution Percent of Wound Bed Debrided: 100% T Area Debrided (cm): otal 14.13 Tissue and other material debrided: Non-Viable, Slough, Slough Level: Non-Viable Tissue Debridement Description: Selective/Open Wound Instrument: Curette Bleeding: Minimum Hemostasis Achieved:  Pressure Response to Treatment: Procedure was tolerated well Level of Consciousness (Post- Awake and Alert procedure): Post Debridement Measurements of Total Wound Length: (cm) 3 Width: (cm) 6 Depth: (cm) 0.1 Volume: (cm) 1.414 Character of Wound/Ulcer Post Debridement: Improved Post Procedure Diagnosis [name], [name] ([id number]) [id number] Page 2 of 16 Same as Pre-procedure Electronic Signature(s) Signed: 12/22/2022 11:43:56 AM By: Samuella Bruin Signed: 12/25/2022 9:44:25 AM By: Duanne Guess MD FACS Entered By: Samuella Bruin on 12/22/2022 08:11:00 -------------------------------------------------------------------------------- Debridement Details Patient Name: Date of Service: [name], MA [name]. 12/22/2022 10:30 A M Medical Record Number: [id number] Patient Account Number: [id number] Date of Birth/Sex: Treating RN: July 12, 2000 (21 y.o. [name] Primary Care Provider: Enid Skeens Other Clinician: Referring Provider: Treating Provider/Extender: Arther Dames in Treatment: 30 Debridement Performed for Assessment: Wound #7 Right,Anterior Lower Leg Performed By: Physician Duanne Guess, MD The following information was scribed by: Samuella Bruin The information was scribed for: Duanne Guess Debridement Type: Debridement Level of Consciousness (Pre-procedure): Awake and Alert Pre-procedure Verification/Time Out Yes - 11:10 Taken: Start Time: 11:10 Pain Control: Lidocaine 4% T opical Solution Percent of Wound Bed Debrided: 100% T Area Debrided (cm): otal 0.38 Tissue and other material debrided: Non-Viable, Slough, Slough Level: Non-Viable Tissue Debridement Description: Selective/Open Wound Instrument: Curette Bleeding: Minimum Hemostasis Achieved: Pressure Response to Treatment: Procedure was tolerated well Level of Consciousness (Post- Awake and Alert procedure): Post Debridement  Measurements of Total Wound Length: (cm) 0.6 Width: (cm) 0.8 Depth: (cm) 0.1 Volume: (cm) 0.038 Character of Wound/Ulcer Post Debridement: Improved Post Procedure Diagnosis Same as Pre-procedure Electronic Signature(s) Signed: 12/22/2022 11:43:56 AM By: Samuella Bruin Signed: 12/25/2022 9:44:25 AM By: Duanne Guess MD FACS Entered By: Samuella Bruin on 12/22/2022 08:11:22 -------------------------------------------------------------------------------- Debridement Details Patient Name: Date of Service: [name], MA [name]. 12/22/2022 10:30 A M Medical Record Number: [id number] Patient Account Number: [id number] Date of Birth/Sex: Treating RN: 2001/01/18 (21 y.o. [name] Primary Care Provider: Enid Skeens Other Clinician: ROCKY, [name] ([id number]) [id number] Page  3 of 16 Referring Provider: Treating Provider/Extender: Arther Dames in Treatment: 30 Debridement Performed for Assessment: Wound #8 Right,Medial Lower Leg Performed By: Physician Duanne Guess, MD The following information was scribed by: Samuella Bruin The information was scribed for: Duanne Guess Debridement Type: Debridement Level of Consciousness (Pre-procedure): Awake and Alert Pre-procedure Verification/Time Out Yes - 11:10 Taken: Start Time: 11:10 Pain Control: Lidocaine 4% Topical Solution Percent of Wound Bed Debrided: 100% T Area Debrided (cm): otal 1.96 Tissue and other material debrided: Non-Viable, Eschar, Slough, Slough Level: Non-Viable Tissue Debridement Description: Selective/Open Wound Instrument: Curette Bleeding: Minimum Hemostasis Achieved: Pressure Response to Treatment: Procedure was tolerated well Level of Consciousness (Post- Awake and Alert procedure): Post Debridement Measurements of Total Wound Length: (cm) 1 Width: (cm) 2.5 Depth: (cm) 0.1 Volume: (cm) 0.196 Character of Wound/Ulcer Post  Debridement: Improved Post Procedure Diagnosis Same as Pre-procedure Electronic Signature(s) Signed: 12/22/2022 11:43:56 AM By: Samuella Bruin Signed: 12/25/2022 9:44:25 AM By: Duanne Guess MD FACS Entered By: Samuella Bruin on 12/22/2022 08:11:44 -------------------------------------------------------------------------------- Debridement Details Patient Name: Date of Service: [name], MA [name]. 12/22/2022 10:30 A M Medical Record Number: [id number] Patient Account Number: [id number] Date of Birth/Sex: Treating RN: 12-16-2000 (21 y.o. [name] Primary Care Provider: Enid Skeens Other Clinician: Referring Provider: Treating Provider/Extender: Arther Dames in Treatment: 30 Debridement Performed for Assessment: Wound #12 Right,Lateral Lower Leg Performed By: Physician Duanne Guess, MD The following information was scribed by: Samuella Bruin The information was scribed for: Duanne Guess Debridement Type: Debridement Level of Consciousness (Pre-procedure): Awake and Alert Pre-procedure Verification/Time Out Yes - 11:10 Taken: Start Time: 11:10 Pain Control: Lidocaine 4% Topical Solution Percent of Wound Bed Debrided: 100% T Area Debrided (cm): otal 1.65 Tissue and other material debrided: Non-Viable, Eschar Level: Non-Viable Tissue Debridement Description: Selective/Open Wound Instrument: Curette Bleeding: Minimum Hemostasis Achieved: Pressure Response to Treatment: Procedure was tolerated well Level of Consciousness (Post- Awake and Alert [name], [name] ([id number]) [id number] Page 4 of 16 Awake and Alert procedure): Post Debridement Measurements of Total Wound Length: (cm) 1.4 Width: (cm) 1.5 Depth: (cm) 0.1 Volume: (cm) 0.165 Character of Wound/Ulcer Post Debridement: Improved Post Procedure Diagnosis Same as Pre-procedure Electronic Signature(s) Signed: 12/22/2022 11:43:56 AM  By: Samuella Bruin Signed: 12/25/2022 9:44:25 AM By: Duanne Guess MD FACS Entered By: Samuella Bruin on 12/22/2022 08:14:34 -------------------------------------------------------------------------------- Debridement Details Patient Name: Date of Service: [name], MA [name]. 12/22/2022 10:30 A M Medical Record Number: [id number] Patient Account Number: [id number] Date of Birth/Sex: Treating RN: 10/31/2000 (21 y.o. [name] Primary Care Provider: Enid Skeens Other Clinician: Referring Provider: Treating Provider/Extender: Arther Dames in Treatment: 30 Debridement Performed for Assessment: Wound #3 Left Hand - Dorsum Performed By: Physician Duanne Guess, MD The following information was scribed by: Samuella Bruin The information was scribed for: Duanne Guess Debridement Type: Debridement Level of Consciousness (Pre-procedure): Awake and Alert Pre-procedure Verification/Time Out Yes - 11:10 Taken: Start Time: 11:10 Pain Control: Lidocaine 4% Topical Solution Percent of Wound Bed Debrided: 200% T Area Debrided (cm): otal 1.27 Tissue and other material debrided: Non-Viable, Eschar, Slough, Slough Level: Non-Viable Tissue Debridement Description: Selective/Open Wound Instrument: Curette Bleeding: Minimum Hemostasis Achieved: Pressure Response to Treatment: Procedure was tolerated well Level of Consciousness (Post- Awake and Alert procedure): Post Debridement Measurements of Total Wound Length: (cm) 0.9 Width: (cm) 0.9 Depth: (cm) 0.1 Volume: (cm) 0.064 Character of Wound/Ulcer Post Debridement: Improved Post Procedure Diagnosis Same as Pre-procedure  Electronic Signature(s) Signed: 12/22/2022 11:43:56 AM By: Samuella Bruin Signed: 12/25/2022 9:44:25 AM By: Duanne Guess MD FACS Entered By: Samuella Bruin on 12/22/2022 08:16:31 [name], [name] ([id number]) [id number] Page 5 of  16 -------------------------------------------------------------------------------- Debridement Details Patient Name: Date of Service: DO [name], [place]. 12/22/2022 10:30 A M Medical Record Number: [id number] Patient Account Number: [id number] Date of Birth/Sex: Treating RN: May 25, 2000 (21 y.o. [name] Primary Care Provider: Enid Skeens Other Clinician: Referring Provider: Treating Provider/Extender: Arther Dames in Treatment: 30 Debridement Performed for Assessment: Wound #13 Left,Anterior Lower Leg Performed By: Physician Duanne Guess, MD The following information was scribed by: Samuella Bruin The information was scribed for: Duanne Guess Debridement Type: Debridement Level of Consciousness (Pre-procedure): Awake and Alert Pre-procedure Verification/Time Out Yes - 11:10 Taken: Start Time: 11:10 Pain Control: Lidocaine 4% Topical Solution Percent of Wound Bed Debrided: 100% T Area Debrided (cm): otal 0.2 Tissue and other material debrided: Non-Viable, Eschar, Slough, Slough Level: Non-Viable Tissue Debridement Description: Selective/Open Wound Instrument: Curette Bleeding: Minimum Hemostasis Achieved: Pressure Response to Treatment: Procedure was tolerated well Level of Consciousness (Post- Awake and Alert procedure): Post Debridement Measurements of Total Wound Length: (cm) 0.5 Width: (cm) 0.5 Depth: (cm) 0.1 Volume: (cm) 0.02 Character of Wound/Ulcer Post Debridement: Improved Post Procedure Diagnosis Same as Pre-procedure Electronic Signature(s) Signed: 12/22/2022 11:43:56 AM By: Samuella Bruin Signed: 12/25/2022 9:44:25 AM By: Duanne Guess MD FACS Entered By: Samuella Bruin on 12/22/2022 08:18:31 -------------------------------------------------------------------------------- HPI Details Patient Name: Date of Service: [name], MA [name]. 12/22/2022 10:30 A M Medical Record Number:  [id number] Patient Account Number: [id number] Date of Birth/Sex: Treating RN: 2000-08-16 (21 y.o. F) Primary Care Provider: Enid Skeens Other Clinician: Referring Provider: Treating Provider/Extender: Arther Dames in Treatment: 30 History of Present Illness HPI Description: ADMISSION 05/23/2022 This is a 22 year old young woman with Prader-Willi syndrome and the usual accompanying super morbid obesity, diabetes, and skin picking disorder. She picked a large wound in her right thigh that resulted in an emergency department visit on 17 February. Apparently her father was cleaning the wound with MONZERAT, HANDLER Black ([id number]) [id number] Page 6 of 16 mupirocin and hydrogen peroxide. A culture was taken that grew out Proteus and methicillin sensitive Staph aureus. She was prescribed levofloxacin. She saw her primary care provider a few days later and based on the depth and appearance of the wound, she was referred to the wound care center for further evaluation and management. There is a large area on the patient's upper right thigh that is excoriated and scaly with dry skin that has obviously been picked at. The primary wound is 4-1/2 cm in depth. It is much cleaner than described in the ED notes. There is also a linear ulcer adjacent to this deep site with slough accumulation. 3/15; patient presents for follow-up. She has been using iodoform packing followed by silver alginate to the wound bed. Mother is present during the encounter. There is been improvement in wound healing. 06/23/2022: The depth of the wound has come in further. The periwound skin is in improved condition with very few open areas. 07/21/2022: The wound continues to contract. Her periwound skin has completely healed. 08/18/2022: The wound measured a couple of millimeters deeper today. No concern for infection. 09/22/2022: Unfortunately, over the past month, [name] has engaged in  significant skin picking activity. She has wounds on each of her wrists, 1 on her right dorsal hand, 1 on her left shoulder, and  1 on her left anterior tibial surface. The initial wound on her right thigh is a little bit shallower. No concern for infection at any of the sites. 10/20/2022: The right wrist wound, the left wrist wound, the left shoulder wound, and the right thigh wound are healed. She has picked new wounds on her right lower leg and the left dorsal hand. There is slough and eschar on all of the wound surfaces. 11/20/2022: The left knee wound is healed. She has reopened the wounds on her right hand and left wrist. The shoulder remains healed. She also has a new wound on her abdomen. 12/22/2022: The wounds on her right hand are healed. She has 2 open wounds on her left hand. The wound on her left lower abdomen has healed but she has opened up a new 1 on the right lower abdomen. The left knee has reopened. The right lower leg wounds are smaller, but she has a new 1 on the lateral aspect of her right lower leg. There is slough and eschar accumulation on the wound surfaces. Electronic Signature(s) Signed: 12/22/2022 11:54:36 AM By: Duanne Guess MD FACS Entered By: Duanne Guess on 12/22/2022 08:54:36 -------------------------------------------------------------------------------- Physical Exam Details Patient Name: Date of Service: [name], MA [name]. 12/22/2022 10:30 A M Medical Record Number: [id number] Patient Account Number: [id number] Date of Birth/Sex: Treating RN: [birth year] (21 y.o. F) Primary Care Provider: Enid Skeens Other Clinician: Referring Provider: Treating Provider/Extender: Arther Dames in Treatment: 30 Constitutional Hypertensive, asymptomatic. Tachycardic, asymptomatic. . . no acute distress. Respiratory Normal work of breathing on room air. Notes 12/22/2022: The wounds on her right hand are healed. She has 2 open wounds on her left  hand. The wound on her left lower abdomen has healed but she has opened up a new 1 on the right lower abdomen. The left knee has reopened. The right lower leg wounds are smaller, but she has a new 1 on the lateral aspect of her right lower leg. There is slough and eschar accumulation on the wound surfaces. Electronic Signature(s) Signed: 12/22/2022 11:57:46 AM By: Duanne Guess MD FACS Entered By: Duanne Guess on 12/22/2022 08:57:45 -------------------------------------------------------------------------------- Physician Orders Details Patient Name: Date of Service: [name], MA [name]. 12/22/2022 10:30 A M Medical Record Number: [id number] Patient Account Number: [id number] Date of Birth/Sex: Treating RN: 30-Dec-2000 (21 y.o. 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 - Dr. Lady Gary - room 2 Anesthetic (In clinic) Topical Lidocaine 4% applied to wound bed Bathing/ Shower/ Hygiene May shower and wash wound with soap and water. Wound Treatment Wound #11 - Abdomen - Upper Quadrant Wound Laterality:  Right Cleanser: Soap and Water 1 x Per Day/30 Days Discharge Instructions: May shower and wash wound with dial antibacterial soap and  water prior to dressing change. Cleanser: Wound Cleanser 1 x Per Day/30 Days Discharge Instructions: Cleanse the wound with wound cleanser prior to applying a clean dressing using gauze sponges, not tissue or cotton balls. Topical: Mupirocin Ointment 1 x Per Day/30 Days Discharge Instructions: Apply Mupirocin (Bactroban) as instructed Prim Dressing: Maxorb Extra Ag+ Alginate Dressing, 2x2 (in/in) (Generic) 1 x Per Day/30 Days ary Discharge Instructions: Apply to wound bed as instructed Secondary Dressing: T Non-Adherent Dressing, 3x4 in (DME) (Generic) 1 x Per Day/30 Days elfa Discharge Instructions: Apply over primary dressing as directed. Secondary Dressing: Woven Gauze Sponge, Non-Sterile 4x4 in (DME) (Generic) 1 x Per Day/30 Days Discharge Instructions: Apply over primary dressing as directed. Secured With: Paper Tape, 2x10 (in/yd) (DME) (Generic) 1 x Per Day/30 Days Discharge Instructions: Secure dressing with tape as directed. Wound #12 - Lower Leg Wound Laterality: Right, Lateral Cleanser: Soap and Water 1 x Per Day/30 Days Discharge Instructions: May shower and wash wound with dial antibacterial soap and water prior to dressing change. Cleanser: Wound Cleanser 1 x Per Day/30 Days Discharge Instructions: Cleanse the wound with wound cleanser prior to applying a clean dressing using gauze sponges, not tissue or cotton balls. Topical: Mupirocin Ointment 1 x Per Day/30 Days Discharge Instructions: Apply Mupirocin (Bactroban) as instructed Prim Dressing: Maxorb Extra Ag+ Alginate Dressing, 2x2 (in/in) (Generic) 1 x Per Day/30 Days ary Discharge Instructions: Apply to wound bed as instructed Secondary Dressing: T Non-Adherent Dressing, 3x4 in (DME) (Generic) 1 x Per Day/30 Days elfa Discharge Instructions: Apply over primary dressing as  directed. Secondary Dressing: Woven Gauze Sponge, Non-Sterile 4x4 in (DME) (Generic) 1 x Per Day/30 Days Discharge Instructions: Apply over primary dressing as directed. Secured With: Paper Tape, 2x10 (in/yd) (DME) (Generic) 1 x Per Day/30 Days Discharge Instructions: Secure dressing with tape as directed. [name], [name] ([id number]) [id number] Page 8 of 16 Wound #13 - Lower Leg Wound Laterality: Left, Anterior Cleanser: Soap and Water 1 x Per Day/30 Days Discharge Instructions: May shower and wash wound with dial antibacterial soap and water prior to dressing change. Cleanser: Wound Cleanser 1 x Per Day/30 Days Discharge Instructions: Cleanse the wound with wound cleanser prior to applying a clean dressing using gauze sponges, not tissue or cotton balls. Topical: Mupirocin Ointment 1 x Per Day/30 Days Discharge Instructions: Apply Mupirocin (Bactroban) as instructed Prim Dressing: Maxorb Extra Ag+ Alginate Dressing, 2x2 (in/in) (Generic) 1 x Per Day/30 Days ary Discharge Instructions: Apply to wound bed as instructed Secondary Dressing: T Non-Adherent Dressing, 3x4 in (DME) (Generic) 1 x Per Day/30 Days elfa Discharge Instructions: Apply over primary dressing as directed. Secondary Dressing: Woven Gauze Sponge, Non-Sterile 4x4 in (DME) (Generic) 1 x Per Day/30 Days Discharge Instructions: Apply over primary dressing as directed. Secured With: Paper Tape, 2x10 (in/yd) (DME) (Generic) 1 x Per Day/30 Days Discharge Instructions: Secure dressing with tape as directed. Wound #3 - Hand - Dorsum Wound Laterality: Left Cleanser: Soap and Water 1 x Per Day/30 Days Discharge Instructions: May shower and wash wound with dial antibacterial soap and water prior to dressing change. Cleanser: Wound Cleanser 1 x Per Day/30 Days Discharge Instructions: Cleanse the wound with wound cleanser prior to applying a clean dressing using gauze sponges, not tissue or cotton  balls. Topical: Mupirocin Ointment 1 x Per Day/30 Days Discharge Instructions: Apply Mupirocin (Bactroban) as instructed Prim Dressing: Maxorb Extra Ag+ Alginate Dressing, 2x2 (in/in) (Generic) 1 x Per Day/30 Days ary Discharge Instructions: Apply to wound bed as instructed Secondary Dressing:  T Non-Adherent Dressing, 3x4 in (DME) (Generic) 1 x Per Day/30 Days elfa Discharge Instructions: Apply over primary dressing as directed. Secondary Dressing: Woven Gauze Sponge, Non-Sterile 4x4 in (DME) (Generic) 1 x Per Day/30 Days Discharge Instructions: Apply over primary dressing as directed. Secured With: Paper Tape, 2x10 (in/yd) (DME) (Generic) 1 x Per Day/30 Days Discharge Instructions: Secure dressing with tape as directed. Wound #7 - Lower Leg Wound Laterality: Right, Anterior Cleanser: Soap and Water 1 x Per Day/30 Days Discharge Instructions: May shower and wash wound with dial antibacterial soap and water prior to dressing change. Cleanser: Wound Cleanser 1 x Per Day/30 Days Discharge Instructions: Cleanse the wound with wound cleanser prior to applying a clean dressing using gauze sponges, not tissue or cotton balls. Topical: Mupirocin Ointment 1 x Per Day/30 Days Discharge Instructions: Apply Mupirocin (Bactroban) as instructed Prim Dressing: Maxorb Extra Ag+ Alginate Dressing, 2x2 (in/in) (Generic) 1 x Per Day/30 Days ary Discharge Instructions: Apply to wound bed as instructed Secondary Dressing: T Non-Adherent Dressing, 3x4 in (DME) (Generic) 1 x Per Day/30 Days elfa Discharge Instructions: Apply over primary dressing as directed. Secondary Dressing: Woven Gauze Sponge, Non-Sterile 4x4 in (DME) (Generic) 1 x Per Day/30 Days Discharge Instructions: Apply over primary dressing as directed. Secured With: Paper Tape, 2x10 (in/yd) (DME) (Generic) 1 x Per Day/30 Days Discharge Instructions: Secure dressing with tape as directed. Wound #8 - Lower Leg Wound Laterality: Right,  Medial Cleanser: Soap and Water 1 x Per Day/30 Days Discharge Instructions: May shower and wash wound with dial antibacterial soap and water prior to dressing change. Cleanser: Wound Cleanser 1 x Per Day/30 Days Discharge Instructions: Cleanse the wound with wound cleanser prior to applying a clean dressing using gauze sponges, not tissue or cotton balls. Topical: Mupirocin Ointment 1 x Per Day/30 Days Discharge Instructions: Apply Mupirocin (Bactroban) as instructed [name], [name] ([id number]) [id number] Page 9 of 16 Prim Dressing: Maxorb Extra Ag+ Alginate Dressing, 2x2 (in/in) (Generic) 1 x Per Day/30 Days ary Discharge Instructions: Apply to wound bed as instructed Secondary Dressing: T Non-Adherent Dressing, 3x4 in (DME) (Generic) 1 x Per Day/30 Days elfa Discharge Instructions: Apply over primary dressing as directed. Secondary Dressing: Woven Gauze Sponge, Non-Sterile 4x4 in (DME) (Generic) 1 x Per Day/30 Days Discharge Instructions: Apply over primary dressing as directed. Secured With: Paper Tape, 2x10 (in/yd) (DME) (Generic) 1 x Per Day/30 Days Discharge Instructions: Secure dressing with tape as directed. Patient Medications llergies: gloves, latex A Notifications Medication Indication Start End 12/22/2022 lidocaine DOSE topical 4 % cream - cream topical Electronic Signature(s) Signed: 12/25/2022 9:44:25 AM By: Duanne Guess MD FACS Previous Signature: 12/22/2022 11:43:56 AM Version By: Samuella Bruin Entered By: Duanne Guess on 12/22/2022 08:58:10 -------------------------------------------------------------------------------- Problem List Details Patient Name: Date of Service: [name], MA [name]. 12/22/2022 10:30 A M Medical Record Number: [id number] Patient Account Number: [id number] Date of Birth/Sex: Treating RN: Apr 22, 2000 (21 y.o. 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 05/23/2022 No Yes Q87.11 Prader-Willi syndrome 05/23/2022 No Yes Z68.45 Body mass index [  BMI] 70 or greater, adult 05/23/2022 No Yes E11.65 Type 2 diabetes mellitus with hyperglycemia 05/23/2022 No Yes [name], [name] ([id number]) [id number] Page 10 of 16 Inactive Problems Resolved Problems ICD-10 Code Description Active Date Resolved Date L97.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 05/23/2022 05/23/2022 Electronic Signature(s) Signed: 12/22/2022 11:46:57 AM By: Duanne Guess MD FACS Entered By: Duanne Guess on 12/22/2022 08:46:57 -------------------------------------------------------------------------------- Progress Note Details Patient Name: Date of Service: [name], MA [name]. 12/22/2022 10:30 A M Medical Record Number: [id number] Patient Account Number: [id number] Date of Birth/Sex: Treating RN: December 20, 2000 (21 y.o. F) Primary Care Provider: Enid Skeens Other Clinician: Referring Provider: Treating Provider/Extender: Arther Dames in Treatment: 30 Subjective Chief Complaint Information obtained from Patient Patient seen for complaints of Non-Healing Wound. History of Present Illness (HPI) ADMISSION 05/23/2022 This is a 22 year old young woman with Prader-Willi syndrome and the usual accompanying super morbid obesity, diabetes, and skin picking disorder. She picked a large wound in her right  thigh that resulted in an emergency department visit on 17 February. Apparently her father was cleaning the wound with mupirocin and hydrogen peroxide. A culture was taken that grew out Proteus and methicillin sensitive Staph aureus. She was prescribed levofloxacin. She saw her primary care provider a few days later and based on the depth and appearance of the wound, she was referred to the wound care center for further evaluation and management. There is a large area on the patient's upper right thigh that is excoriated and scaly with dry skin that has obviously been picked at. The primary wound is 4-1/2 cm in depth. It is much cleaner than described in the ED notes. There is also a linear ulcer adjacent to this deep site with slough accumulation. 3/15; patient presents for follow-up. She has been using iodoform packing followed by silver alginate to the wound bed. Mother is present during the encounter. There is been improvement in wound healing. 06/23/2022: The depth of the wound has come in further. The periwound skin is in improved condition with very few open areas. 07/21/2022: The wound continues to contract. Her periwound skin has completely healed. 08/18/2022: The wound measured a couple of millimeters deeper today. No concern for infection. 09/22/2022: Unfortunately, over the past month, [name] has engaged in significant skin picking activity. She has wounds on each of her wrists, 1 on her right dorsal hand, 1 on her left shoulder, and 1 on her left anterior tibial surface. The initial wound on her right thigh is a little bit shallower. No concern for infection at any of the sites. 10/20/2022: The right wrist wound, the left wrist wound, the left shoulder wound, and the right thigh wound are healed. She has picked new wounds on her right lower leg and the left dorsal hand. There is slough and eschar on all of the wound surfaces. 11/20/2022: The left knee wound is healed. She has reopened the  wounds on her right hand and left wrist. The shoulder remains healed. She also has a new wound on her abdomen. 12/22/2022: The wounds on her right hand are healed. She has 2 open wounds on her left hand. The wound on her left lower abdomen has healed but she has opened up a new 1 on the right lower abdomen. The left knee has reopened. The right lower leg wounds are smaller, but she has a new 1 on the lateral aspect of her right lower leg. There is slough and eschar accumulation  on the wound surfaces. Patient History Information obtained from Caregiver. Family History [name], [name] ([id number]) [id number] Page 11 of 16 Hypertension - Father, Thyroid Problems - Father, No family history of Cancer, Diabetes, Heart Disease, Hereditary Spherocytosis, Kidney Disease, Lung Disease, Seizures, Stroke, Tuberculosis. Social History Never smoker, Marital Status - Single, Alcohol Use - Never, Drug Use - No History, Caffeine Use - Never. Medical History Endocrine Patient has history of Type II Diabetes Hospitalization/Surgery History - knee surgery. - myringotomy. Medical A Surgical History Notes nd Constitutional Symptoms (General Health) obese Musculoskeletal scoliosis Neurologic Prader-willi syndrome Psychiatric compulsive skin picking Objective Constitutional Hypertensive, asymptomatic. Tachycardic, asymptomatic. no acute distress. Vitals Time Taken: 10:52 AM, Height: 60 in, Weight: 380 lbs, BMI: 74.2, Temperature: 97.8 F, Pulse: 112 bpm, Respiratory Rate: 18 breaths/min, Blood Pressure: 160/83 mmHg. Respiratory Normal work of breathing on room air. General Notes: 12/22/2022: The wounds on her right hand are healed. She has 2 open wounds on her left hand. The wound on her left lower abdomen has healed but she has opened up a new 1 on the right lower abdomen. The left knee has reopened. The right lower leg wounds are smaller, but she has a new 1 on the lateral  aspect of her right lower leg. There is slough and eschar accumulation on the wound surfaces. Integumentary (Hair, Skin) Wound #10 status is Healed - Epithelialized. Original cause of wound was Skin T ear/Laceration. The date acquired was: 11/20/2022. The wound has been in treatment 4 weeks. The wound is located on the Left Abdomen - Lower Quadrant. The wound measures 0cm length x 0cm width x 0cm depth; 0cm^2 area and 0cm^3 volume. There is Fat Layer (Subcutaneous Tissue) exposed. There is no tunneling or undermining noted. There is a medium amount of serosanguineous drainage noted. The wound margin is distinct with the outline attached to the wound base. There is no granulation within the wound bed. There is a large (67- 100%) amount of necrotic tissue within the wound bed including Eschar. The periwound skin appearance had no abnormalities noted for texture. The periwound skin appearance had no abnormalities noted for moisture. The periwound skin appearance had no abnormalities noted for color. Periwound temperature was noted as No Abnormality. Wound #11 status is Open. Original cause of wound was Trauma. The date acquired was: 11/26/2022. The wound is located on the Right Abdomen - Upper Quadrant. The wound measures 3cm length x 6cm width x 0.1cm depth; 14.137cm^2 area and 1.414cm^3 volume. There is Fat Layer (Subcutaneous Tissue) exposed. There is no tunneling or undermining noted. There is a medium amount of serosanguineous drainage noted. The wound margin is distinct with the outline attached to the wound base. There is large (67-100%) red, pink granulation within the wound bed. There is a small (1-33%) amount of necrotic tissue within the wound bed including Adherent Slough. The periwound skin appearance had no abnormalities noted for moisture. The periwound skin appearance had no abnormalities noted for color. The periwound skin appearance exhibited: Scarring. Periwound temperature was noted as No  Abnormality. Wound #12 status is Open. Original cause of wound was Skin T ear/Laceration. The date acquired was: 12/22/2022. The wound is located on the Right,Lateral Lower Leg. The wound measures 1.4cm length x 1.5cm width x 0.1cm depth; 1.649cm^2 area and 0.165cm^3 volume. There is Fat Layer (Subcutaneous Tissue) exposed. There is no tunneling or undermining noted. There is a medium amount of serosanguineous drainage noted. The wound margin is distinct with the outline attached to the wound  base. There is small (1-33%) red granulation within the wound bed. There is a large (67-100%) amount of necrotic tissue within the wound bed including Eschar. The periwound skin appearance had no abnormalities noted for texture. The periwound skin appearance had no abnormalities noted for moisture. The periwound skin appearance had no abnormalities noted for color. Periwound temperature was noted as No Abnormality. Wound #13 status is Open. Original cause of wound was Skin T ear/Laceration. The date acquired was: 12/22/2022. The wound is located on the Left,Anterior Lower Leg. The wound measures 0.5cm length x 0.5cm width x 0.1cm depth; 0.196cm^2 area and 0.02cm^3 volume. There is Fat Layer (Subcutaneous Tissue) exposed. There is no tunneling or undermining noted. There is a medium amount of serosanguineous drainage noted. The wound margin is distinct with the outline attached to the wound base. There is small (1-33%) red granulation within the wound bed. There is a large (67-100%) amount of necrotic tissue within the wound bed including Eschar. The periwound skin appearance had no abnormalities noted for texture. The periwound skin appearance had no abnormalities noted for moisture. The periwound skin appearance had no abnormalities noted for color. Periwound temperature was noted as No Abnormality. Wound #3 status is Open. Original cause of wound was Skin T ear/Laceration. The date acquired was: 09/08/2022. The  wound has been in treatment 13 weeks. The wound is located on the Left Hand - Dorsum. The wound measures 0.9cm length x 0.9cm width x 0.1cm depth; 0.636cm^2 area and 0.064cm^3 volume. There is Fat Layer (Subcutaneous Tissue) exposed. There is no tunneling or undermining noted. There is a medium amount of serous drainage noted. The wound margin is distinct with the outline attached to the wound base. There is large (67-100%) red, pink granulation within the wound bed. There is a small (1- 33%) amount of necrotic tissue within the wound bed including Adherent Slough. The periwound skin appearance had no abnormalities noted for texture. The periwound skin appearance had no abnormalities noted for moisture. The periwound skin appearance had no abnormalities noted for color. Periwound temperature was noted as No Abnormality. Wound #7 status is Open. Original cause of wound was Skin T ear/Laceration. The date acquired was: 09/25/2022. The wound has been in treatment 9 weeks. The wound is located on the Right,Anterior Lower Leg. The wound measures 0.6cm length x 0.8cm width x 0.1cm depth; 0.377cm^2 area and 0.038cm^3 volume. There is Fat Layer (Subcutaneous Tissue) exposed. There is no tunneling or undermining noted. There is a medium amount of serosanguineous drainage noted. The wound margin is distinct with the outline attached to the wound base. There is large (67-100%) pink granulation within the wound bed. There is a small (1Danean Marner, Aryella Black ([id number]) [id number] Page 12 of 16 33%) amount of necrotic tissue within the wound bed including Eschar and Adherent Slough. The periwound skin appearance had no abnormalities noted for texture. The periwound skin appearance had no abnormalities noted for moisture. The periwound skin appearance exhibited: Rubor. Periwound temperature was noted as No Abnormality. Wound #8 status is Open. Original cause of wound was Skin T  ear/Laceration. The date acquired was: 09/25/2022. The wound has been in treatment 9 weeks. The wound is located on the Right,Medial Lower Leg. The wound measures 1cm length x 2.5cm width x 0.1cm depth; 1.963cm^2 area and 0.196cm^3 volume. There is Fat Layer (Subcutaneous Tissue) exposed. There is no tunneling or undermining noted. There is a medium amount of serosanguineous drainage noted. The wound margin is distinct with the outline attached  to the wound base. There is medium (34-66%) red granulation within the wound bed. There is a medium (34- 66%) amount of necrotic tissue within the wound bed including Eschar and Adherent Slough. The periwound skin appearance had no abnormalities noted for moisture. The periwound skin appearance had no abnormalities noted for color. The periwound skin appearance did not exhibit: Scarring. Periwound temperature was noted as No Abnormality. Wound #9 status is Healed - Epithelialized. Original cause of wound was Skin Tear/Laceration. The date acquired was: 11/16/2022. The wound has been in treatment 4 weeks. The wound is located on the Right Hand - Dorsum. The wound measures 0cm length x 0cm width x 0cm depth; 0cm^2 area and 0cm^3 volume. There is no tunneling or undermining noted. There is a none present amount of drainage noted. The wound margin is distinct with the outline attached to the wound base. There is no granulation within the wound bed. There is no necrotic tissue within the wound bed. The periwound skin appearance had no abnormalities noted for texture. The periwound skin appearance had no abnormalities noted for moisture. The periwound skin appearance had no abnormalities noted for color. Periwound temperature was noted as No Abnormality. 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 There was a Selective/Open Wound Non-Viable Tissue Debridement with a total area of 14.13 sq cm performed by Duanne Guess, MD. With the following instrument(s): Curette to remove Non-Viable tissue/material. Material removed includes [facility] after achieving pain control using Lidocaine 4% Topical Solution. No specimens were taken. A time out was conducted at 11:10, prior to the start of the procedure. A Minimum amount of bleeding was controlled with Pressure. The procedure was tolerated well. Post Debridement Measurements: 3cm length x 6cm width x 0.1cm depth; 1.414cm^3 volume. Character of Wound/Ulcer Post Debridement is improved. Post procedure Diagnosis Wound #11: Same as Pre-Procedure Wound #12 Pre-procedure diagnosis of Wound #12 is a Skin T located on the Right,Lateral Lower Leg . There was a Selective/Open Wound Non-Viable Tissue ear Debridement with a total area of 1.65 sq cm performed by Duanne Guess, MD. With the following instrument(s): Curette to remove Non-Viable tissue/material. Material removed includes Eschar after achieving pain control using Lidocaine 4% Topical Solution. No specimens were taken. A time out was conducted at 11:10, prior to the start of the procedure. A Minimum amount of bleeding was controlled with Pressure. The procedure was tolerated well. Post Debridement Measurements: 1.4cm length x 1.5cm width x 0.1cm depth; 0.165cm^3 volume. Character of Wound/Ulcer Post Debridement is improved. Post procedure Diagnosis Wound #12: Same as Pre-Procedure Wound #13 Pre-procedure diagnosis of Wound #13 is an Abrasion located on the Left,Anterior Lower  Leg . There was a Selective/Open Wound Non-Viable Tissue Debridement with a total area of 0.2 sq cm performed by Duanne Guess, MD. With the following instrument(s): Curette to remove Non-Viable tissue/material. Material removed includes Eschar and Slough and after achieving pain control using Lidocaine 4% Topical Solution. No specimens were taken. A time out was conducted at 11:10, prior to the start of the procedure. A Minimum amount of bleeding  was controlled with Pressure. The procedure was tolerated well. Post Debridement Measurements: 0.5cm length x 0.5cm width x 0.1cm depth; 0.02cm^3 volume. Character of Wound/Ulcer Post Debridement is improved. Post procedure Diagnosis Wound #13: Same as Pre-Procedure Wound #3 Pre-procedure diagnosis of Wound #3 is an Abrasion located on the Left Hand - Dorsum . There was a Selective/Open Wound Non-Viable Tissue Debridement with a total area of 1.27 sq cm performed by Duanne Guess, MD. With the following instrument(s): Curette to remove Non-Viable tissue/material. Material removed includes Eschar and Slough and after achieving pain control using Lidocaine 4% T opical Solution. No specimens were taken. A time out was conducted at 11:10, prior to the start of the procedure. A Minimum amount of bleeding was controlled with Pressure. The procedure was tolerated well. Post Debridement Measurements: 0.9cm length x 0.9cm width x 0.1cm depth; 0.064cm^3 volume. Character of Wound/Ulcer Post Debridement is improved. Post procedure Diagnosis Wound #3: Same as Pre-Procedure Wound #7 Pre-procedure diagnosis of Wound #7 is an Abrasion located on the Right,Anterior Lower Leg . There was a Selective/Open Wound Non-Viable Tissue Debridement with a total area of 0.38 sq cm performed by Duanne Guess, MD. With the following instrument(s): Curette to remove Non-Viable tissue/material. Material removed includes [facility] after achieving pain control using Lidocaine 4%  Topical Solution. No specimens were taken. A time out was conducted at 11:10, prior to the start of the procedure. A Minimum amount of bleeding was controlled with Pressure. The procedure was tolerated well. Post Debridement Measurements: 0.6cm length x 0.8cm width x 0.1cm depth; 0.038cm^3 volume. Character of Wound/Ulcer Post Debridement is improved. Post procedure Diagnosis Wound #7: Same as Pre-Procedure [name], [name] ([id number]) [id number] Page 13 of 16 Wound #8 Pre-procedure diagnosis of Wound #8 is an Abrasion located on the Right,Medial Lower Leg . There was a Selective/Open Wound Non-Viable Tissue Debridement with a total area of 1.96 sq cm performed by Duanne Guess, MD. With the following instrument(s): Curette to remove Non-Viable tissue/material. Material removed includes Eschar and Slough and after achieving pain control using Lidocaine 4% Topical Solution. No specimens were taken. A time out was conducted at 11:10, prior to the start of the procedure. A Minimum amount of bleeding was controlled with Pressure. The procedure was tolerated well. Post Debridement Measurements: 1cm length x 2.5cm width x 0.1cm depth; 0.196cm^3 volume. Character of Wound/Ulcer Post Debridement is improved. Post procedure Diagnosis Wound #8: Same as Pre-Procedure Plan Follow-up Appointments: Return appointment in 1 month. - Dr. Lady Gary - room 2 Anesthetic: (In clinic) Topical Lidocaine 4% applied to wound bed Bathing/ Shower/ Hygiene: May shower and wash wound with soap and water. The following medication(s) was prescribed: lidocaine topical 4 % cream cream topical was prescribed at facility WOUND #11: - Abdomen - Upper Quadrant Wound Laterality: Right Cleanser: Soap and Water 1 x Per Day/30 Days Discharge Instructions: May shower and wash wound with dial antibacterial soap and water prior to dressing change. Cleanser: Wound Cleanser 1 x Per Day/30 Days Discharge  Instructions: Cleanse the wound with wound cleanser prior to applying a clean dressing using gauze sponges, not tissue or cotton balls. Topical: Mupirocin Ointment 1 x Per Day/30 Days Discharge Instructions: Apply Mupirocin (Bactroban) as instructed Prim Dressing: Maxorb Extra Ag+ Alginate Dressing, 2x2 (in/in) (Generic) 1 x Per Day/30 Days ary Discharge Instructions: Apply to wound bed as instructed Secondary Dressing: T Non-Adherent Dressing, 3x4 in (DME) (Generic) 1 x Per Day/30 Days elfa Discharge Instructions: Apply over primary dressing as directed. Secondary Dressing: Woven Gauze  Sponge, Non-Sterile 4x4 in (DME) (Generic) 1 x Per Day/30 Days Discharge Instructions: Apply over primary dressing as directed. Secured With: Paper T ape, 2x10 (in/yd) (DME) (Generic) 1 x Per Day/30 Days Discharge Instructions: Secure dressing with tape as directed. WOUND #12: - Lower Leg Wound Laterality: Right, Lateral Cleanser: Soap and Water 1 x Per Day/30 Days Discharge Instructions: May shower and wash wound with dial antibacterial soap and water prior to dressing change. Cleanser: Wound Cleanser 1 x Per Day/30 Days Discharge Instructions: Cleanse the wound with wound cleanser prior to applying a clean dressing using gauze sponges, not tissue or cotton balls. Topical: Mupirocin Ointment 1 x Per Day/30 Days Discharge Instructions: Apply Mupirocin (Bactroban) as instructed Prim Dressing: Maxorb Extra Ag+ Alginate Dressing, 2x2 (in/in) (Generic) 1 x Per Day/30 Days ary Discharge Instructions: Apply to wound bed as instructed Secondary Dressing: T Non-Adherent Dressing, 3x4 in (DME) (Generic) 1 x Per Day/30 Days elfa Discharge Instructions: Apply over primary dressing as directed. Secondary Dressing: Woven Gauze Sponge, Non-Sterile 4x4 in (DME) (Generic) 1 x Per Day/30 Days Discharge Instructions: Apply over primary dressing as directed. Secured With: Paper T ape, 2x10 (in/yd) (DME) (Generic) 1 x Per  Day/30 Days Discharge Instructions: Secure dressing with tape as directed. WOUND #13: - Lower Leg Wound Laterality: Left, Anterior Cleanser: Soap and Water 1 x Per Day/30 Days Discharge Instructions: May shower and wash wound with dial antibacterial soap and water prior to dressing change. Cleanser: Wound Cleanser 1 x Per Day/30 Days Discharge Instructions: Cleanse the wound with wound cleanser prior to applying a clean dressing using gauze sponges, not tissue or cotton balls. Topical: Mupirocin Ointment 1 x Per Day/30 Days Discharge Instructions: Apply Mupirocin (Bactroban) as instructed Prim Dressing: Maxorb Extra Ag+ Alginate Dressing, 2x2 (in/in) (Generic) 1 x Per Day/30 Days ary Discharge Instructions: Apply to wound bed as instructed Secondary Dressing: T Non-Adherent Dressing, 3x4 in (DME) (Generic) 1 x Per Day/30 Days elfa Discharge Instructions: Apply over primary dressing as directed. Secondary Dressing: Woven Gauze Sponge, Non-Sterile 4x4 in (DME) (Generic) 1 x Per Day/30 Days Discharge Instructions: Apply over primary dressing as directed. Secured With: Paper T ape, 2x10 (in/yd) (DME) (Generic) 1 x Per Day/30 Days Discharge Instructions: Secure dressing with tape as directed. WOUND #3: - Hand - Dorsum Wound Laterality: Left Cleanser: Soap and Water 1 x Per Day/30 Days Discharge Instructions: May shower and wash wound with dial antibacterial soap and water prior to dressing change. Cleanser: Wound Cleanser 1 x Per Day/30 Days Discharge Instructions: Cleanse the wound with wound cleanser prior to applying a clean dressing using gauze sponges, not tissue or cotton balls. Topical: Mupirocin Ointment 1 x Per Day/30 Days Discharge Instructions: Apply Mupirocin (Bactroban) as instructed Prim Dressing: Maxorb Extra Ag+ Alginate Dressing, 2x2 (in/in) (Generic) 1 x Per Day/30 Days ary Discharge Instructions: Apply to wound bed as instructed Secondary Dressing: T Non-Adherent Dressing,  3x4 in (DME) (Generic) 1 x Per Day/30 Days elfa Discharge Instructions: Apply over primary dressing as directed. Secondary Dressing: Woven Gauze Sponge, Non-Sterile 4x4 in (DME) (Generic) 1 x Per Day/30 Days Discharge Instructions: Apply over primary dressing as directed. Secured With: Paper T ape, 2x10 (in/yd) (DME) (Generic) 1 x Per Day/30 Days Discharge Instructions: Secure dressing with tape as directed. WOUND #7: - Lower Leg Wound Laterality: Right, Anterior Cleanser: Soap and Water 1 x Per Day/30 Days Discharge Instructions: May shower and wash wound with dial antibacterial soap and water prior to dressing change. Cleanser: Wound Cleanser 1 x Per  Day/30 Days Discharge Instructions: Cleanse the wound with wound cleanser prior to applying a clean dressing using gauze sponges, not tissue or cotton balls. [name], [name] ([id number]) [id number] Page 14 of 16 Topical: Mupirocin Ointment 1 x Per Day/30 Days Discharge Instructions: Apply Mupirocin (Bactroban) as instructed Prim Dressing: Maxorb Extra Ag+ Alginate Dressing, 2x2 (in/in) (Generic) 1 x Per Day/30 Days ary Discharge Instructions: Apply to wound bed as instructed Secondary Dressing: T Non-Adherent Dressing, 3x4 in (DME) (Generic) 1 x Per Day/30 Days elfa Discharge Instructions: Apply over primary dressing as directed. Secondary Dressing: Woven Gauze Sponge, Non-Sterile 4x4 in (DME) (Generic) 1 x Per Day/30 Days Discharge Instructions: Apply over primary dressing as directed. Secured With: Paper T ape, 2x10 (in/yd) (DME) (Generic) 1 x Per Day/30 Days Discharge Instructions: Secure dressing with tape as directed. WOUND #8: - Lower Leg Wound Laterality: Right, Medial Cleanser: Soap and Water 1 x Per Day/30 Days Discharge Instructions: May shower and wash wound with dial antibacterial soap and water prior to dressing change. Cleanser: Wound Cleanser 1 x Per Day/30 Days Discharge Instructions: Cleanse  the wound with wound cleanser prior to applying a clean dressing using gauze sponges, not tissue or cotton balls. Topical: Mupirocin Ointment 1 x Per Day/30 Days Discharge Instructions: Apply Mupirocin (Bactroban) as instructed Prim Dressing: Maxorb Extra Ag+ Alginate Dressing, 2x2 (in/in) (Generic) 1 x Per Day/30 Days ary Discharge Instructions: Apply to wound bed as instructed Secondary Dressing: T Non-Adherent Dressing, 3x4 in (DME) (Generic) 1 x Per Day/30 Days elfa Discharge Instructions: Apply over primary dressing as directed. Secondary Dressing: Woven Gauze Sponge, Non-Sterile 4x4 in (DME) (Generic) 1 x Per Day/30 Days Discharge Instructions: Apply over primary dressing as directed. Secured With: Paper T ape, 2x10 (in/yd) (DME) (Generic) 1 x Per Day/30 Days Discharge Instructions: Secure dressing with tape as directed. 12/22/2022: The wounds on her right hand are healed. She has 2 open wounds on her left hand. The wound on her left lower abdomen has healed but she has opened up a new 1 on the right lower abdomen. The left knee has reopened. The right lower leg wounds are smaller, but she has a new 1 on the lateral aspect of her right lower leg. There is slough and eschar accumulation on the wound surfaces. I used a curette to debride slough and eschar from her wounds. We will continue topical mupirocin with silver alginate. Follow-up in 1 month. Electronic Signature(s) Signed: 12/22/2022 11:58:39 AM By: Duanne Guess MD FACS Entered By: Duanne Guess on 12/22/2022 08:58:38 -------------------------------------------------------------------------------- HxROS Details Patient Name: Date of Service: [name], MA [name]. 12/22/2022 10:30 A M Medical Record Number: [id number] Patient Account Number: [id number] Date of Birth/Sex: Treating RN: [birth year] (21 y.o. F) Primary Care Provider: Enid Skeens Other Clinician: Referring Provider: Treating Provider/Extender: Arther Dames in Treatment: 30 Information Obtained From Caregiver Constitutional Symptoms (General Health) Medical History: Past Medical History Notes: obese Endocrine Medical History: Positive for: Type II Diabetes Time with diabetes: 3 yrs Treated with: Insulin Blood sugar tested every day: Yes Tested : Musculoskeletal Medical History: Past Medical History Notes: scoliosis ZAIA, CARRE Black ([id number]) [id number] Page 15 of 16 Neurologic Medical History: Past Medical History Notes: Prader-willi syndrome Psychiatric Medical History: Past Medical History Notes: compulsive skin picking Immunizations Pneumococcal Vaccine: Received Pneumococcal Vaccination: No Implantable Devices None Hospitalization / Surgery History Type of Hospitalization/Surgery knee surgery myringotomy Family and Social History Cancer: No; Diabetes: No; Heart Disease: No; Hereditary Spherocytosis: No; Hypertension: Yes -  Father; Kidney Disease: No; Lung Disease: No; Seizures: No; Stroke: No; Thyroid Problems: Yes - Father; Tuberculosis: No; Never smoker; Marital Status - Single; Alcohol Use: Never; Drug Use: No History; Caffeine Use: Never; Financial Concerns: No; Food, Clothing or Shelter Needs: No; Support System Lacking: No; Transportation Concerns: No Electronic Signature(s) Signed: 12/25/2022 9:44:25 AM By: Duanne Guess MD FACS Entered By: Duanne Guess on 12/22/2022 08:57:14 -------------------------------------------------------------------------------- SuperBill Details Patient Name: Date of Service: [name], MA [name]. 12/22/2022 Medical Record Number: [id number] Patient Account Number: [id number] Date of Birth/Sex: Treating RN: 03-13-2001 (21 y.o. 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 Q87.11 Prader-Willi syndrome Z68.45 Body mass index [BMI] 70 or greater, adult Facility Procedures Physician Procedures : CPT4 Code Description Modifier [id number] - WC PHYS LEVEL 4 - EST PT 25 ICD-10 Diagnosis Description L97.812 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osed L98.492 Non-pressure chronic ulcer of skin of other sites  with fat layer exposed L97.822 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

## 2023-01-02 ENCOUNTER — Encounter: Payer: Self-pay | Admitting: Nurse Practitioner

## 2023-01-05 ENCOUNTER — Other Ambulatory Visit: Payer: Self-pay

## 2023-01-05 DIAGNOSIS — Q8711 Prader-Willi syndrome: Secondary | ICD-10-CM

## 2023-01-05 DIAGNOSIS — F424 Excoriation (skin-picking) disorder: Secondary | ICD-10-CM

## 2023-01-11 ENCOUNTER — Encounter: Payer: Self-pay | Admitting: Obstetrics and Gynecology

## 2023-01-11 ENCOUNTER — Ambulatory Visit: Payer: 59 | Admitting: Obstetrics and Gynecology

## 2023-01-11 ENCOUNTER — Other Ambulatory Visit: Payer: Self-pay | Admitting: Nurse Practitioner

## 2023-01-11 VITALS — BP 122/68 | HR 78 | Ht 60.0 in | Wt 376.0 lb

## 2023-01-11 DIAGNOSIS — N910 Primary amenorrhea: Secondary | ICD-10-CM | POA: Insufficient documentation

## 2023-01-11 MED ORDER — MEDROXYPROGESTERONE ACETATE 10 MG PO TABS
10.0000 mg | ORAL_TABLET | Freq: Every day | ORAL | 0 refills | Status: DC
Start: 2023-01-11 — End: 2023-02-16

## 2023-01-11 NOTE — Progress Notes (Signed)
22 y.o. 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 Lives with her parents and siblings. Primary concern is amenorrhea.  Reviewed chart: has seen OBGYN at [facility] with recommendations for withdrawal bleeding. However, dad is unsure about work-up or discussion. Her primarily takes care of daughter at home.  No LMP recorded. (Menstrual status: Other).    Pelvic discharge or pain: none Breast mass, nipple discharge or skin changes : none Birth control: none Last PAP: never Gardasil: completed Sexually active: never per dad  Exercising: no  GYN HISTORY: Primary amenorrhea  OB History  Gravida Para Term Preterm AB Living  0 0 0 0 0 0  SAB IAB Ectopic Multiple Live Births  0 0 0 0 0    Past Medical History:  Diagnosis Date   Body mass index (BMI) of 70 or greater in adult [facility]) 07/20/2020   DM (diabetes mellitus), type 2, uncontrolled with complications    Open wound of skin 07/20/2020   Pain in joint involving pelvic region and thigh, bilateral 07/20/2020   Prader-Willi syndrome    Scoliosis    Tibia vara of left lower extremity     Past Surgical History:  Procedure Laterality Date   KNEE SURGERY     MYRINGOTOMY Bilateral     Current Outpatient Medications on File Prior to Visit  Medication Sig Dispense Refill   Accu-Chek FastClix Lancets MISC SMARTSIG:Topical 6 Times Daily     AMBULATORY NON FORMULARY MEDICATION Rolling walker with seat and hand breaks for ambulatory assistance based on insurance coverage and patient fit. 1 Units 0   B-D UF III MINI PEN NEEDLES 31G X 5 MM MISC SMARTSIG:Injection 90 each 3   mometasone (ELOCON) 0.1 % cream Apply to areas of itching twice a day. (Patient taking differently: Apply 1 Application topically daily as needed (itching).) 45 g 11   mupirocin ointment (BACTROBAN) 2 % APPLY TOPICALLY TO THE AFFECTED AREA 2 TO 3 TIMES DAILY AS NEEDED 110 g 1   oxybutynin  (DITROPAN-XL) 5 MG 24 hr tablet TAKE 1 TABLET(5 MG) BY MOUTH AT BEDTIME 90 tablet 1   Vitamin D, Ergocalciferol, (DRISDOL) 1.25 MG (50000 UNIT) CAPS capsule Take 1 capsule (50,000 Units total) by mouth every 7 (seven) days. 12 capsule 3   No current facility-administered medications on file prior to visit.    Social History   Socioeconomic History   Marital status: Single    Spouse name: Not on file   Number of children: Not on file   Years of education: Not on file   Highest education level: Not on file  Occupational History   Not on file  Tobacco Use   Smoking status: Never   Smokeless tobacco: Never  Vaping Use   Vaping status: Never Used  Substance and Sexual Activity   Alcohol use: No   Drug use: Never   Sexual activity: Never  Other Topics Concern   Not on file  Social History Narrative   Not on file   Social Determinants of Health   Financial Resource Strain: Low Risk  (07/03/2022)   Overall Financial Resource Strain (CARDIA)    Difficulty of Paying Living Expenses: Not hard at all  Food Insecurity: No Food Insecurity (07/03/2022)   Hunger Vital Sign    Worried About Running Out of Food in the Last Year: Never true    Ran Out of Food in the Last Year: Never true  Transportation Needs: No  Transportation Needs (07/03/2022)   PRAPARE - Administrator, Civil Service (Medical): No    Lack of Transportation (Non-Medical): No  Physical Activity: Inactive (07/03/2022)   Exercise Vital Sign    Days of Exercise per Week: 0 days    Minutes of Exercise per Session: 0 min  Stress: Stress Concern Present (07/03/2022)   [employer] of Occupational Health - Occupational Stress Questionnaire    Feeling of Stress : To some extent  Social Connections: Unknown (08/06/2021)   Received from [facility], [facility]   Social Network    Social Network: Not on file  Intimate Partner Violence: Unknown (06/29/2021)   Received from [facility], [facility]   HITS     Physically Hurt: Not on file    Insult or Talk Down To: Not on file    Threaten Physical Harm: Not on file    Scream or Curse: Not on file    Family History  Problem Relation Age of Onset   Hypertension Father    Heart disease Maternal Grandmother    Cancer Paternal Grandfather    Breast cancer Neg Hx    Ovarian cancer Neg Hx     Allergies  Allergen Reactions   Latex Rash      PE Today's Vitals   01/11/23 0931  BP: 122/68  Pulse: 78  SpO2: 100%  Weight: (!) 376 lb (170.6 kg)  Height: 5' (1.524 m)   Body mass index is 73.43 kg/m.  Physical Exam Vitals reviewed.  Constitutional:      General: She is not in acute distress.    Appearance: She is obese.     Comments: Using a walker for pain  HENT:     Head: Normocephalic and atraumatic.     Nose: Nose normal.  Eyes:     Extraocular Movements: Extraocular movements intact.     Conjunctiva/sclera: Conjunctivae normal.  Pulmonary:     Effort: Pulmonary effort is normal.  Musculoskeletal:     Cervical back: Normal range of motion.  Neurological:     Mental Status: She is alert.     Gait: Gait abnormal.  Psychiatric:        Mood and Affect: Mood normal.       Assessment and Plan:        Primary amenorrhea Assessment & Plan: Will complete hormonal work-up for primary amenorrhea. Provera for progestin challenge provided. Dad wants to ensure reproductive health but would prefer menstrual suppression to cycle regulation Discussed potential risk of uterine cancer with irregular menses Will plan for menstrual suppression if +progestin challenge. Exam deferred today with goal of rapport building in the setting of intellectual disability RTO in 1 month for annual exam and PAP   Orders: -     Estradiol -     FSH/LH -     Prolactin -     Testos,Total,Free and SHBG (Female) -     17-Hydroxyprogesterone -     hCG, quantitative, pregnancy -     medroxyPROGESTERone Acetate; Take 1 tablet (10 mg total) by mouth  daily. (Patient not taking: Reported on 01/23/2023)  Dispense: 10 tablet; Refill: 0    Rosalyn Gess, MD

## 2023-01-11 NOTE — Patient Instructions (Addendum)
Start provera. Bleeding should start within 2 weeks.  Recommend ibuprofen 600-800mg  TID with meals x5d with menstrual bleeding. Call our office if she does not have bleeding.

## 2023-01-12 LAB — HCG, QUANTITATIVE, PREGNANCY: HCG, Total, QN: <5 m[IU]/mL

## 2023-01-12 LAB — FSH/LH
FSH: 4.1 m[IU]/mL
LH: 1.9 m[IU]/mL

## 2023-01-12 LAB — ESTRADIOL: Estradiol: 53 pg/mL

## 2023-01-12 LAB — PROLACTIN: Prolactin: 5.4 ng/mL

## 2023-01-19 ENCOUNTER — Ambulatory Visit (HOSPITAL_BASED_OUTPATIENT_CLINIC_OR_DEPARTMENT_OTHER): Payer: 59 | Admitting: General Surgery

## 2023-01-19 LAB — TESTOS,TOTAL,FREE AND SHBG (FEMALE)
Free Testosterone: 2 pg/mL (ref 0.1–6.4)
Sex Hormone Binding: 30.4 nmol/L (ref 17–124)
Testosterone, Total, LC-MS-MS: 15 ng/dL (ref 2–45)

## 2023-01-19 LAB — 17-HYDROXYPROGESTERONE: 17-OH-Progesterone, LC/MS/MS: 13 ng/dL

## 2023-01-20 ENCOUNTER — Encounter: Payer: Self-pay | Admitting: Obstetrics and Gynecology

## 2023-01-22 NOTE — Telephone Encounter (Signed)
Dr. Kennith Center, how long do you prefer to monitor for menses after provera?

## 2023-01-23 ENCOUNTER — Ambulatory Visit (INDEPENDENT_AMBULATORY_CARE_PROVIDER_SITE_OTHER): Payer: 59 | Admitting: Nurse Practitioner

## 2023-01-23 ENCOUNTER — Encounter: Payer: Self-pay | Admitting: Nurse Practitioner

## 2023-01-23 VITALS — BP 134/82 | HR 98 | Wt 375.0 lb

## 2023-01-23 DIAGNOSIS — N910 Primary amenorrhea: Secondary | ICD-10-CM

## 2023-01-23 DIAGNOSIS — F424 Excoriation (skin-picking) disorder: Secondary | ICD-10-CM | POA: Diagnosis not present

## 2023-01-23 DIAGNOSIS — Z6841 Body Mass Index (BMI) 40.0 and over, adult: Secondary | ICD-10-CM

## 2023-01-23 DIAGNOSIS — E785 Hyperlipidemia, unspecified: Secondary | ICD-10-CM

## 2023-01-23 DIAGNOSIS — F99 Mental disorder, not otherwise specified: Secondary | ICD-10-CM

## 2023-01-23 DIAGNOSIS — Q8711 Prader-Willi syndrome: Secondary | ICD-10-CM

## 2023-01-23 DIAGNOSIS — E1165 Type 2 diabetes mellitus with hyperglycemia: Secondary | ICD-10-CM

## 2023-01-23 DIAGNOSIS — F5105 Insomnia due to other mental disorder: Secondary | ICD-10-CM

## 2023-01-23 DIAGNOSIS — Z5189 Encounter for other specified aftercare: Secondary | ICD-10-CM

## 2023-01-23 DIAGNOSIS — E538 Deficiency of other specified B group vitamins: Secondary | ICD-10-CM

## 2023-01-23 DIAGNOSIS — Z794 Long term (current) use of insulin: Secondary | ICD-10-CM

## 2023-01-23 DIAGNOSIS — R52 Pain, unspecified: Secondary | ICD-10-CM

## 2023-01-23 DIAGNOSIS — E559 Vitamin D deficiency, unspecified: Secondary | ICD-10-CM

## 2023-01-23 DIAGNOSIS — E1169 Type 2 diabetes mellitus with other specified complication: Secondary | ICD-10-CM

## 2023-01-23 MED ORDER — TRAMADOL HCL 50 MG PO TABS: 50.0000 mg | ORAL_TABLET | Freq: Every evening | ORAL | 0 refills | Status: AC | PRN

## 2023-01-23 MED ORDER — TRAZODONE HCL 100 MG PO TABS: 100.0000 mg | ORAL_TABLET | Freq: Every day | ORAL | 11 refills | Status: AC

## 2023-01-23 MED ORDER — MOUNJARO 10 MG/0.5ML ~~LOC~~ SOAJ
10.0000 mg | SUBCUTANEOUS | 1 refills | Status: DC
Start: 2023-01-23 — End: 2023-02-09

## 2023-01-23 MED ORDER — ATORVASTATIN CALCIUM 20 MG PO TABS: 20.0000 mg | ORAL_TABLET | Freq: Every day | ORAL | 3 refills | Status: AC

## 2023-01-23 NOTE — Progress Notes (Signed)
Rebekah Clamp, DNP, AGNP-c [facility]  [address] [place], [place] [address] [phone]  ESTABLISHED PATIENT- Chronic Health and/or Follow-Up Visit  Blood pressure 134/82, pulse 98, weight (!) 375 lb (170.1 kg).    [name] is a 22 y.o. year old female presenting today for evaluation and management of chronic conditions.   History of Present Illness [name], a 22 year old individual with Prader-Willi syndrome, presents with ongoing skin picking behaviors and weight management issues. The skin picking behavior, which started around the age of 3 or 4, has escalated to the point of causing open sores on the patient's legs, arms, and abdomen. The patient's father reports that these behaviors occur day and night, often triggered by anticipation or anxiety. Despite attempts to manage these behaviors with sertraline, the patient continues to pick at her skin daily.  The patient's weight has also been a significant concern. The patient's mobility is severely affected due to the excess weight, causing daily pain and difficulty walking. The patient's father reports that a specialist recommended weight loss of 100 pounds before considering surgery for the patient's leg and hip issues. However, the patient's compulsive eating behaviors have made weight loss challenging. The patient's father reports instances of the patient eating large quantities of food when unsupervised and expresses concern about the patient's ability to manage her weight independently in the future.  The patient's father also reports that the patient's medication, Greggory Keen, is causing frequent bowel movements, sometimes leading to accidents if the patient is asleep. This has been a recent change, as the patient's sister, who also takes [facility], experiences similar side effects.  The patient's father expresses significant stress and frustration regarding the patient's care, citing lack of support from  other family members and the challenges of managing the patient's behaviors and health issues. He is considering more intensive, possibly inpatient, interventions to help manage the patient's weight and skin picking behaviors.   All ROS negative with exception of what is listed above.   PHYSICAL EXAM Physical Exam Vitals and nursing note reviewed.  Constitutional:      General: She is not in acute distress.    Appearance: She is obese. She is not ill-appearing.  Eyes:     Conjunctiva/sclera: Conjunctivae normal.  Cardiovascular:     Rate and Rhythm: Normal rate and regular rhythm.     Pulses: Normal pulses.     Heart sounds: Normal heart sounds.  Pulmonary:     Effort: Pulmonary effort is normal.     Breath sounds: Normal breath sounds.  Abdominal:     General: Bowel sounds are normal. There is no distension.     Tenderness: There is no abdominal tenderness. There is no guarding.  Musculoskeletal:        General: Deformity present.  Skin:    General: Skin is warm and dry.     Capillary Refill: Capillary refill takes less than 2 seconds.  Neurological:     Mental Status: She is alert. Mental status is at baseline.     Motor: Weakness present.     Coordination: Coordination abnormal.     Gait: Gait abnormal.  Psychiatric:        Behavior: Behavior normal.      PLAN Problem List Items Addressed This Visit     Primary amenorrhea   Relevant Medications   tirzepatide (MOUNJARO) 10 MG/0.5ML Pen   atorvastatin (LIPITOR) 20 MG tablet   traMADol (ULTRAM) 50 MG tablet   traZODone (DESYREL) 100 MG tablet  Other Relevant Orders   Hemoglobin A1c   Vitamin B12   Comprehensive metabolic panel   Lipid panel   VITAMIN D 25 Hydroxy (Vit-D Deficiency, Fractures)   TSH   T4, free   CBC with Differential/Platelet   Prader-Willi syndrome   Relevant Medications   tirzepatide (MOUNJARO) 10 MG/0.5ML Pen   atorvastatin (LIPITOR) 20 MG tablet   traMADol (ULTRAM) 50 MG tablet    traZODone (DESYREL) 100 MG tablet   Other Relevant Orders   Hemoglobin A1c   Vitamin B12   Comprehensive metabolic panel   Lipid panel   VITAMIN D 25 Hydroxy (Vit-D Deficiency, Fractures)   TSH   T4, free   CBC with Differential/Platelet   Compulsive skin picking   Relevant Medications   tirzepatide (MOUNJARO) 10 MG/0.5ML Pen   atorvastatin (LIPITOR) 20 MG tablet   traMADol (ULTRAM) 50 MG tablet   traZODone (DESYREL) 100 MG tablet   Other Relevant Orders   Hemoglobin A1c   Vitamin B12   Comprehensive metabolic panel   Lipid panel   VITAMIN D 25 Hydroxy (Vit-D Deficiency, Fractures)   TSH   T4, free   CBC with Differential/Platelet   Uncontrolled type 2 diabetes mellitus with hyperglycemia, with long-term current use of insulin (HCC) - Primary   Relevant Medications   tirzepatide (MOUNJARO) 10 MG/0.5ML Pen   atorvastatin (LIPITOR) 20 MG tablet   traMADol (ULTRAM) 50 MG tablet   traZODone (DESYREL) 100 MG tablet   Other Relevant Orders   Hemoglobin A1c   Vitamin B12   Comprehensive metabolic panel   Lipid panel   VITAMIN D 25 Hydroxy (Vit-D Deficiency, Fractures)   TSH   T4, free   CBC with Differential/Platelet   BMI 70 and over, adult (HCC)   Relevant Medications   tirzepatide (MOUNJARO) 10 MG/0.5ML Pen   atorvastatin (LIPITOR) 20 MG tablet   traMADol (ULTRAM) 50 MG tablet   traZODone (DESYREL) 100 MG tablet   Other Relevant Orders   Hemoglobin A1c   Vitamin B12   Comprehensive metabolic panel   Lipid panel   VITAMIN D 25 Hydroxy (Vit-D Deficiency, Fractures)   TSH   T4, free   CBC with Differential/Platelet   Wound check, abscess   Relevant Medications   tirzepatide (MOUNJARO) 10 MG/0.5ML Pen   atorvastatin (LIPITOR) 20 MG tablet   traMADol (ULTRAM) 50 MG tablet   traZODone (DESYREL) 100 MG tablet   Other Relevant Orders   Hemoglobin A1c   Vitamin B12   Comprehensive metabolic panel   Lipid panel   VITAMIN D 25 Hydroxy (Vit-D Deficiency, Fractures)    TSH   T4, free   CBC with Differential/Platelet   Other Visit Diagnoses     Hyperlipidemia associated with type 2 diabetes mellitus (HCC)       Relevant Medications   tirzepatide (MOUNJARO) 10 MG/0.5ML Pen   atorvastatin (LIPITOR) 20 MG tablet   traMADol (ULTRAM) 50 MG tablet   traZODone (DESYREL) 100 MG tablet   Other Relevant Orders   Hemoglobin A1c   Vitamin B12   Comprehensive metabolic panel   Lipid panel   VITAMIN D 25 Hydroxy (Vit-D Deficiency, Fractures)   TSH   T4, free   CBC with Differential/Platelet   Insomnia due to other mental disorder       Relevant Medications   tirzepatide (MOUNJARO) 10 MG/0.5ML Pen   atorvastatin (LIPITOR) 20 MG tablet   traMADol (ULTRAM) 50 MG tablet   traZODone (DESYREL) 100 MG tablet  Other Relevant Orders   Hemoglobin A1c   Vitamin B12   Comprehensive metabolic panel   Lipid panel   VITAMIN D 25 Hydroxy (Vit-D Deficiency, Fractures)   TSH   T4, free   CBC with Differential/Platelet   Pain       Relevant Medications   traMADol (ULTRAM) 50 MG tablet   B12 deficiency       Relevant Medications   tirzepatide (MOUNJARO) 10 MG/0.5ML Pen   atorvastatin (LIPITOR) 20 MG tablet   traMADol (ULTRAM) 50 MG tablet   traZODone (DESYREL) 100 MG tablet   Other Relevant Orders   Hemoglobin A1c   Vitamin B12   Comprehensive metabolic panel   Lipid panel   VITAMIN D 25 Hydroxy (Vit-D Deficiency, Fractures)   TSH   T4, free   CBC with Differential/Platelet   Vitamin D deficiency       Relevant Medications   tirzepatide (MOUNJARO) 10 MG/0.5ML Pen   atorvastatin (LIPITOR) 20 MG tablet   traMADol (ULTRAM) 50 MG tablet   traZODone (DESYREL) 100 MG tablet   Other Relevant Orders   Hemoglobin A1c   Vitamin B12   Comprehensive metabolic panel   Lipid panel   VITAMIN D 25 Hydroxy (Vit-D Deficiency, Fractures)   TSH   T4, free   CBC with Differential/Platelet      Prader-Willi Syndrome Compulsive skin picking behavior, not  improved with Sertraline. Discussed the possibility of an anxiety or anticipation-driven behavior. -Taper off Sertraline over the next three weeks (half for a week, then one for a week, then half of one for a week, then stop). -Consider trial of over-the-counter N-acetylcysteine.  Gastrointestinal side effects from [facility] Increased bowel movements leading to incontinence, particularly at night. -Reduce Mounjaro to 10mg .  Chronic Pain Daily pain, difficulty walking due to leg, ankle, and hip pain. -Increase dose of Tramadol.  Obesity Discussed need for weight loss, particularly in the context of potential surgery. Recognized the need for intensive intervention, possibly inpatient. -Encourage daily walking for at least 10 minutes.  Menstrual Irregularities Under evaluation by Gynecology for potential hormonal treatment. -Follow up with Gynecology as planned.  Follow-up -Check for refill of pain medication. -Consider referral for care coordination or social work for additional resources if needed. Return in about 6 months (around 07/24/2023) for Med Management 45.  Rebekah Clamp, DNP, AGNP-c

## 2023-01-23 NOTE — Patient Instructions (Addendum)
Taper off of the sertraline over the 3 weeks..   Take 150 mg once a day for 7 days THEN Take 100 mg once a day 7 days THEN Take 50 mg once a day for 7 days THEN STOP  I have sent in the [facility] at 10mg  to see if this helps with her bathroom use.    N-acetylcysteine (NAC)- you can try this to see if this helps with the skin picking. This is over the counter.

## 2023-01-24 LAB — COMPREHENSIVE METABOLIC PANEL
ALT: 18 IU/L (ref 0–32)
AST: 17 [IU]/L (ref 0–40)
Albumin: 3.9 g/dL — ABNORMAL LOW (ref 4.0–5.0)
Alkaline Phosphatase: 105 [IU]/L (ref 44–121)
BUN/Creatinine Ratio: 17 (ref 9–23)
BUN: 11 mg/dL (ref 6–20)
Bilirubin Total: <0.2 mg/dL (ref 0.0–1.2)
CO2: 23 mmol/L (ref 20–29)
Calcium: 8.9 mg/dL (ref 8.7–10.2)
Chloride: 103 mmol/L (ref 96–106)
Creatinine, Ser: 0.63 mg/dL (ref 0.57–1.00)
Globulin, Total: 3 g/dL (ref 1.5–4.5)
Glucose: 140 mg/dL — ABNORMAL HIGH (ref 70–99)
Potassium: 4.3 mmol/L (ref 3.5–5.2)
Sodium: 138 mmol/L (ref 134–144)
Total Protein: 6.9 g/dL (ref 6.0–8.5)
eGFR: 128 mL/min/{1.73_m2} (ref >59)

## 2023-01-24 LAB — CBC WITH DIFFERENTIAL/PLATELET
Basophils Absolute: 0 10*3/uL (ref 0.0–0.2)
Basos: 1 %
EOS (ABSOLUTE): 0.2 10*3/uL (ref 0.0–0.4)
Eos: 3 %
Hematocrit: 37.5 % (ref 34.0–46.6)
Hemoglobin: 11.8 g/dL (ref 11.1–15.9)
Immature Grans (Abs): 0 10*3/uL (ref 0.0–0.1)
Immature Granulocytes: 0 %
Lymphocytes Absolute: 2.3 10*3/uL (ref 0.7–3.1)
Lymphs: 31 %
MCH: 26.2 pg — ABNORMAL LOW (ref 26.6–33.0)
MCHC: 31.5 g/dL (ref 31.5–35.7)
MCV: 83 fL (ref 79–97)
Monocytes Absolute: 0.5 10*3/uL (ref 0.1–0.9)
Monocytes: 7 %
Neutrophils Absolute: 4.3 10*3/uL (ref 1.4–7.0)
Neutrophils: 58 %
Platelets: 331 10*3/uL (ref 150–450)
RBC: 4.51 x10E6/uL (ref 3.77–5.28)
RDW: 16.1 % — ABNORMAL HIGH (ref 11.7–15.4)
WBC: 7.3 10*3/uL (ref 3.4–10.8)

## 2023-01-24 LAB — TSH: TSH: 2.69 u[IU]/mL (ref 0.450–4.500)

## 2023-01-24 LAB — T4, FREE: Free T4: 1 ng/dL (ref 0.82–1.77)

## 2023-01-24 LAB — LIPID PANEL
Chol/HDL Ratio: 3.1 ratio (ref 0.0–4.4)
Cholesterol, Total: 132 mg/dL (ref 100–199)
HDL: 42 mg/dL (ref >39)
LDL Chol Calc (NIH): 71 mg/dL (ref 0–99)
Triglycerides: 101 mg/dL (ref 0–149)
VLDL Cholesterol Cal: 19 mg/dL (ref 5–40)

## 2023-01-24 LAB — VITAMIN D 25 HYDROXY (VIT D DEFICIENCY, FRACTURES): Vit D, 25-Hydroxy: 35.2 ng/mL (ref 30.0–100.0)

## 2023-01-24 LAB — HEMOGLOBIN A1C
Est. average glucose Bld gHb Est-mCnc: 134 mg/dL
Hgb A1c MFr Bld: 6.3 % — ABNORMAL HIGH (ref 4.8–5.6)

## 2023-01-24 LAB — VITAMIN B12: Vitamin B-12: 481 pg/mL (ref 232–1245)

## 2023-01-24 NOTE — Assessment & Plan Note (Addendum)
Will complete hormonal work-up for primary amenorrhea. Provera for progestin challenge provided. Dad wants to ensure reproductive health but would prefer menstrual suppression to cycle regulation Discussed potential risk of uterine cancer with irregular menses Will plan for menstrual suppression if +progestin challenge. Exam deferred today with goal of rapport building in the setting of intellectual disability RTO in 1 month for annual exam and PAP

## 2023-02-05 ENCOUNTER — Encounter (HOSPITAL_BASED_OUTPATIENT_CLINIC_OR_DEPARTMENT_OTHER): Payer: 59 | Attending: General Surgery | Admitting: General Surgery

## 2023-02-05 DIAGNOSIS — L98492 Non-pressure chronic ulcer of skin 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: Secondary | ICD-10-CM | POA: Diagnosis not present

## 2023-02-05 DIAGNOSIS — E11622 Type 2 diabetes mellitus with other skin ulcer: Secondary | ICD-10-CM | POA: Diagnosis not present

## 2023-02-05 DIAGNOSIS — F424 Excoriation (skin-picking) disorder: Secondary | ICD-10-CM | POA: Diagnosis not present

## 2023-02-05 DIAGNOSIS — Z6841 Body Mass Index (BMI) 40.0 and over, adult: Secondary | ICD-10-CM | POA: Diagnosis not present

## 2023-02-05 DIAGNOSIS — L97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: Secondary | ICD-10-CM | POA: Diagnosis present

## 2023-02-05 DIAGNOSIS — L97812 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osed: Secondary | ICD-10-CM | POA: Insufficient documentation

## 2023-02-05 DIAGNOSIS — Q8711 Prader-Willi syndrome: Secondary | ICD-10-CM | POA: Insufficient documentation

## 2023-02-05 NOTE — Progress Notes (Signed)
[name], [name] ([id number]) [id number] Page 1 of 16 Visit Report for 02/05/2023 Chief Complaint Document Details Patient Name: Date of Service: DO [place], [place]. 02/05/2023 10:00 A M Medical Record Number: [id number] Patient Account Number: [id number] Date of Birth/Sex: Treating RN: [birth year] (22 y.o. F) Primary Care Provider: Enid Skeens Other Clinician: Referring Provider: Treating Provider/Extender: Arther Dames in Treatment: 36 Information Obtained from: Patient Chief Complaint Patient seen for complaints of Non-Healing Wound. Electronic Signature(s) Signed: 02/05/2023 10:34:15 AM By: Duanne Guess MD FACS Entered By: Duanne Guess on 02/05/2023 10:34:15 -------------------------------------------------------------------------------- Debridement Details Patient Name: Date of Service: [name], MA [name]. 02/05/2023 10:00 A M Medical Record Number: [id number] Patient Account Number: [id number] Date of Birth/Sex: Treating RN: December 04, 2000 (22 y.o. [name] Primary Care Provider: Enid Skeens Other Clinician: Referring Provider: Treating Provider/Extender: Arther Dames in Treatment: 36 Debridement Performed for Assessment: Wound #18 Right,Lateral Upper Leg Performed By: Physician Duanne Guess, MD The following information was scribed by: Samuella Bruin The information was scribed for: Duanne Guess Debridement Type: Debridement Severity of Tissue Pre Debridement: Fat layer exposed Level of Consciousness (Pre-procedure): Awake and Alert Pre-procedure Verification/Time Out Yes - 10:27 Taken: Start Time: 10:27 Pain Control: Lidocaine 4% Topical Solution Percent of Wound Bed Debrided: 100% T Area Debrided (cm): otal 10.36 Tissue and other material debrided: Non-Viable, Eschar, Slough, Subcutaneous, Slough Level: Skin/Subcutaneous Tissue Debridement Description:  Excisional Instrument: Curette Bleeding: Minimum Hemostasis Achieved: Pressure Response to Treatment: Procedure was tolerated well Level of Consciousness (Post- Awake and Alert procedure): Post Debridement Measurements of Total Wound Length: (cm) 4.4 Width: (cm) 3 Depth: (cm) 0.1 Volume: (cm) 1.037 Character of Wound/Ulcer Post Debridement: Improved Severity of Tissue Post Debridement: Fat layer exposed [place], [name] ([id number]) [id number] Page 2 of 16 Post Procedure Diagnosis Same as Pre-procedure Electronic Signature(s) Signed: 02/05/2023 10:51:19 AM By: Duanne Guess MD FACS Signed: 02/05/2023 4:15:11 PM By: Samuella Bruin Entered By: Samuella Bruin on 02/05/2023 10:28:18 -------------------------------------------------------------------------------- Debridement Details Patient Name: Date of Service: [name], MA [name]. 02/05/2023 10:00 A M Medical Record Number: [id number] Patient Account Number: [id number] Date of Birth/Sex: Treating RN: 12-31-2000 (22 y.o. [name] Primary Care Provider: Enid Skeens Other Clinician: Referring Provider: Treating Provider/Extender: Arther Dames in Treatment: 36 Debridement Performed for Assessment: Wound #8 Right,Medial Lower Leg Performed By: Physician Duanne Guess, MD The following information was scribed by: Samuella Bruin The information was scribed for: Duanne Guess Debridement Type: Debridement Level of Consciousness (Pre-procedure): Awake and Alert Pre-procedure Verification/Time Out Yes - 10:27 Taken: Start Time: 10:27 Pain Control: Lidocaine 4% Topical Solution Percent of Wound Bed Debrided: 100% T Area Debrided (cm): otal 0.2 Tissue and other material debrided: Non-Viable, Eschar, Subcutaneous Level: Skin/Subcutaneous Tissue Debridement Description: Excisional Instrument: Curette Bleeding: Minimum Hemostasis Achieved:  Pressure Response to Treatment: Procedure was tolerated well Level of Consciousness (Post- Awake and Alert procedure): Post Debridement Measurements of Total Wound Length: (cm) 0.5 Width: (cm) 0.5 Depth: (cm) 0.1 Volume: (cm) 0.02 Character of Wound/Ulcer Post Debridement: Improved Post Procedure Diagnosis Same as Pre-procedure Electronic Signature(s) Signed: 02/05/2023 10:51:19 AM By: Duanne Guess MD FACS Signed: 02/05/2023 4:15:11 PM By: Samuella Bruin Entered By: Samuella Bruin on 02/05/2023 10:28:50 -------------------------------------------------------------------------------- Debridement Details Patient Name: Date of Service: [name], MA [name]. 02/05/2023 10:00 A M Medical Record Number: [id number] Patient Account Number: [id number] [name], [name] ([id number]) [id number] Page 3 of 16 Date of Birth/Sex:  Treating RN: Jun 06, 2000 (22 y.o. [name] Primary Care Provider: Enid Skeens Other Clinician: Referring Provider: Treating Provider/Extender: Arther Dames in Treatment: 36 Debridement Performed for Assessment: Wound #14 Right Hand - Dorsum Performed By: Physician Duanne Guess, MD The following information was scribed by: Samuella Bruin The information was scribed for: Duanne Guess Debridement Type: Debridement Level of Consciousness (Pre-procedure): Awake and Alert Pre-procedure Verification/Time Out Yes - 10:27 Taken: Start Time: 10:27 Pain Control: Lidocaine 4% T opical Solution Percent of Wound Bed Debrided: 100% T Area Debrided (cm): otal 1.1 Tissue and other material debrided: Non-Viable, Slough, Subcutaneous, Slough Level: Skin/Subcutaneous Tissue Debridement Description: Excisional Instrument: Curette Bleeding: Minimum Hemostasis Achieved: Pressure Response to Treatment: Procedure was tolerated well Level of Consciousness (Post- Awake and Alert procedure): Post  Debridement Measurements of Total Wound Length: (cm) 2 Width: (cm) 0.7 Depth: (cm) 0.1 Volume: (cm) 0.11 Character of Wound/Ulcer Post Debridement: Improved Post Procedure Diagnosis Same as Pre-procedure Electronic Signature(s) Signed: 02/05/2023 10:51:19 AM By: Duanne Guess MD FACS Signed: 02/05/2023 4:15:11 PM By: Samuella Bruin Entered By: Samuella Bruin on 02/05/2023 10:29:39 -------------------------------------------------------------------------------- Debridement Details Patient Name: Date of Service: [name], MA [name]. 02/05/2023 10:00 A M Medical Record Number: [id number] Patient Account Number: [id number] Date of Birth/Sex: Treating RN: September 16, 2000 (22 y.o. [name] Primary Care Provider: Enid Skeens Other Clinician: Referring Provider: Treating Provider/Extender: Arther Dames in Treatment: 36 Debridement Performed for Assessment: Wound #16 Left Abdomen - Upper Quadrant Performed By: Physician Duanne Guess, MD The following information was scribed by: Samuella Bruin The information was scribed for: Duanne Guess Debridement Type: Debridement Level of Consciousness (Pre-procedure): Awake and Alert Pre-procedure Verification/Time Out Yes - 10:27 Taken: Start Time: 10:27 Pain Control: Lidocaine 4% T opical Solution Percent of Wound Bed Debrided: 100% T Area Debrided (cm): otal 9.42 Tissue and other material debrided: Non-Viable, Slough, Subcutaneous, Slough Level: Skin/Subcutaneous Tissue Debridement Description: Excisional Instrument: Curette Bleeding: Minimum Hemostasis Achieved: Pressure Tukes, [name] ([id number]) [id number] Page 4 of 16 Response to Treatment: Procedure was tolerated well Level of Consciousness (Post- Awake and Alert procedure): Post Debridement Measurements of Total Wound Length: (cm) 4 Width: (cm) 3 Depth: (cm) 0.1 Volume: (cm) 0.942 Character  of Wound/Ulcer Post Debridement: Improved Post Procedure Diagnosis Same as Pre-procedure Electronic Signature(s) Signed: 02/05/2023 10:51:19 AM By: Duanne Guess MD FACS Signed: 02/05/2023 4:15:11 PM By: Samuella Bruin Entered By: Samuella Bruin on 02/05/2023 10:30:25 -------------------------------------------------------------------------------- Debridement Details Patient Name: Date of Service: [name], MA [name]. 02/05/2023 10:00 A M Medical Record Number: [id number] Patient Account Number: [id number] Date of Birth/Sex: Treating RN: 09-Aug-2000 (22 y.o. [name] Primary Care Provider: Enid Skeens Other Clinician: Referring Provider: Treating Provider/Extender: Arther Dames in Treatment: 36 Debridement Performed for Assessment: Wound #17 Left Abdomen - Lower Quadrant Performed By: Physician Duanne Guess, MD The following information was scribed by: Samuella Bruin The information was scribed for: Duanne Guess Debridement Type: Debridement Level of Consciousness (Pre-procedure): Awake and Alert Pre-procedure Verification/Time Out Yes - 10:27 Taken: Start Time: 10:27 Pain Control: Lidocaine 4% T opical Solution Percent of Wound Bed Debrided: 100% T Area Debrided (cm): otal 0.19 Tissue and other material debrided: Non-Viable, Slough, Subcutaneous, Slough Level: Skin/Subcutaneous Tissue Debridement Description: Excisional Instrument: Curette Bleeding: Minimum Hemostasis Achieved: Pressure Response to Treatment: Procedure was tolerated well Level of Consciousness (Post- Awake and Alert procedure): Post Debridement Measurements of Total Wound Length: (cm) 0.6 Width: (cm) 0.4 Depth: (cm) 0.1  Volume: (cm) 0.019 Character of Wound/Ulcer Post Debridement: Improved Post Procedure Diagnosis Same as Pre-procedure Electronic Signature(s) Signed: 02/05/2023 10:51:19 AM By: Duanne Guess MD FACS Signed: 02/05/2023  4:15:11 PM By: Samuella Bruin Entered By: Samuella Bruin on 02/05/2023 10:30:49 [name], [name] ([id number]) [id number] Page 5 of 16 -------------------------------------------------------------------------------- Debridement Details Patient Name: Date of Service: DO [name], [place] [name]. 02/05/2023 10:00 A M Medical Record Number: [id number] Patient Account Number: [id number] Date of Birth/Sex: Treating RN: 2000-12-06 (22 y.o. [name] Primary Care Provider: Enid Skeens Other Clinician: Referring Provider: Treating Provider/Extender: Arther Dames in Treatment: 36 Debridement Performed for Assessment: Wound #15 Left Upper Arm Performed By: Physician Duanne Guess, MD The following information was scribed by: Samuella Bruin The information was scribed for: Duanne Guess Debridement Type: Debridement Level of Consciousness (Pre-procedure): Awake and Alert Pre-procedure Verification/Time Out Yes - 10:27 Taken: Start Time: 10:27 Pain Control: Lidocaine 4% Topical Solution Percent of Wound Bed Debrided: 80% T Area Debrided (cm): otal 15.07 Tissue and other material debrided: Non-Viable, Eschar, Slough, Subcutaneous, Slough Level: Skin/Subcutaneous Tissue Debridement Description: Excisional Instrument: Curette Bleeding: Minimum Hemostasis Achieved: Pressure Response to Treatment: Procedure was tolerated well Level of Consciousness (Post- Awake and Alert procedure): Post Debridement Measurements of Total Wound Length: (cm) 4 Width: (cm) 6 Depth: (cm) 0.1 Volume: (cm) 1.885 Character of Wound/Ulcer Post Debridement: Improved Post Procedure Diagnosis Same as Pre-procedure Electronic Signature(s) Signed: 02/05/2023 10:51:19 AM By: Duanne Guess MD FACS Signed: 02/05/2023 4:15:11 PM By: Samuella Bruin Entered By: Samuella Bruin on 02/05/2023  10:31:15 -------------------------------------------------------------------------------- HPI Details Patient Name: Date of Service: [name], MA [name]. 02/05/2023 10:00 A M Medical Record Number: [id number] Patient Account Number: [id number] Date of Birth/Sex: Treating RN: 2000-09-29 (22 y.o. 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 She picked a large wound in her right thigh that resulted in an emergency department visit on 17 February. Apparently her father was cleaning the wound with [name], [name] ([id number]) [id number] Page 6 of 16 mupirocin and hydrogen peroxide. A culture was taken that grew out Proteus and methicillin sensitive Staph aureus. She was prescribed levofloxacin. She saw her primary care provider a few days later and based on the depth and appearance of the wound, she was referred to the wound care center for further evaluation and management. There is a large area on the patient's upper right thigh that is excoriated and scaly with dry skin that has obviously been picked at. The primary wound is 4-1/2 cm in depth. It is much cleaner than described in the ED notes. There is also a linear ulcer adjacent to this deep site with slough accumulation. 3/15; patient presents for follow-up. She has been using iodoform packing followed by silver alginate to the wound bed. Mother is present during the encounter. There is been improvement in wound healing. 06/23/2022: The depth of the wound has come in further. The periwound skin is in improved condition with very few open areas. 07/21/2022: The wound continues to contract. Her  periwound skin has completely healed. 08/18/2022: The wound measured a couple of millimeters deeper today. No concern for infection. 09/22/2022: Unfortunately, over the past month, [name] has engaged in significant skin picking activity. She has wounds on each  of her wrists, 1 on her right dorsal hand, 1 on her left shoulder, and 1 on her left anterior tibial surface. The initial wound on her right thigh is a little bit shallower. No concern for infection at any of the sites. 10/20/2022: The right wrist wound, the left wrist wound, the left shoulder wound, and the right thigh wound are healed. She has picked new wounds on her right lower leg and the left dorsal hand. There is slough and eschar on all of the wound surfaces. 11/20/2022: The left knee wound is healed. She has reopened the wounds on her right hand and left wrist. The shoulder remains healed. She also has a new wound on her abdomen. 12/22/2022: The wounds on her right hand are healed. She has 2 open wounds on her left hand. The wound on her left lower abdomen has healed but she has opened up a new 1 on the right lower abdomen. The left knee has reopened. The right lower leg wounds are smaller, but she has a new 1 on the lateral aspect of her right lower leg. There is slough and eschar accumulation on the wound surfaces. 02/05/2023: She has a new wound on the dorsal aspect of her right index finger. She has a cluster of 3 new wounds in her left upper abdominal quadrant and one new wound on her left lower abdominal quadrant. The right thigh wound has reopened, but it is superficial and secondary to picking. The right medial lower leg wound is smaller. She has opened up her left upper arm again. Electronic Signature(s) Signed: 02/05/2023 10:37:52 AM By: Duanne Guess MD FACS Entered By: Duanne Guess on 02/05/2023 10:37:52 -------------------------------------------------------------------------------- Physical Exam Details Patient  Name: Date of Service: [name], MA [name]. 02/05/2023 10:00 A M Medical Record Number: [id number] Patient Account Number: [id number] Date of Birth/Sex: Treating RN: [birth year] (22 y.o. F) Primary Care Provider: Enid Skeens Other Clinician: Referring Provider: Treating Provider/Extender: Arther Dames in Treatment: 36 Constitutional Hypertensive, asymptomatic. . . . no acute distress. Respiratory Normal work of breathing on room air.. Notes 02/05/2023: She has a new wound on the dorsal aspect of her right index finger. She has a cluster of 3 new wounds in her left upper abdominal quadrant and one new wound on her left lower abdominal quadrant. The right thigh wound has reopened, but it is superficial and secondary to picking. The right medial lower leg wound is smaller. She has opened up her left upper arm again. Electronic Signature(s) Signed: 02/05/2023 10:38:21 AM By: Duanne Guess MD FACS Entered By: Duanne Guess on 02/05/2023 10:38:21 Physician Orders Details -------------------------------------------------------------------------------- [name] ([id number]) [id number] Page 7 of 16 Patient Name: Date of Service: DO [name], [place] [name]. 02/05/2023 10:00 A M Medical Record Number: [id number] Patient Account Number: [id number] Date of Birth/Sex: Treating RN: May 11, 2000 (22 y.o. [name] Primary Care Provider: Enid Skeens Other Clinician: Referring Provider: Treating Provider/Extender: Arther Dames in Treatment: 36 The following information was scribed by: Samuella Bruin The information was scribed for: Duanne Guess Verbal / Phone Orders: No Diagnosis Coding Follow-up Appointments Return appointment in 1 month. - Dr. Lady Gary - room 2 Anesthetic (In clinic) Topical Lidocaine 4% applied to wound bed Bathing/ Shower/ Hygiene May shower and wash wound with soap and  water. Wound Treatment Wound #14 - Hand - Dorsum Wound Laterality: Right Cleanser: Soap and Water 1 x Per Day/30 Days Discharge Instructions: May shower and wash  wound with dial antibacterial soap and water prior to dressing change. Cleanser: Wound Cleanser 1 x Per Day/30 Days Discharge Instructions: Cleanse the wound with wound cleanser prior to applying a clean dressing using gauze sponges, not tissue or cotton balls. Topical: Mupirocin Ointment 1 x Per Day/30 Days Discharge Instructions: Apply Mupirocin (Bactroban) as instructed Prim Dressing: Maxorb Extra Ag+ Alginate Dressing, 2x2 (in/in) (DME) (Generic) 1 x Per Day/30 Days ary Discharge Instructions: Apply to wound bed as instructed Secondary Dressing: T Non-Adherent Dressing, 3x4 in (DME) (Generic) 1 x Per Day/30 Days elfa Discharge Instructions: Apply over primary dressing as directed. Secondary Dressing: Woven Gauze Sponge, Non-Sterile 4x4 in (DME) (Generic) 1 x Per Day/30 Days Discharge Instructions: Apply over primary dressing as directed. Secured With: 60M Medipore H Soft Cloth Surgical T ape, 4 x 10 (in/yd) (DME) (Generic) 1 x Per Day/30 Days Discharge Instructions: Secure with tape as directed. Wound #15 - Upper Arm Wound Laterality: Left Cleanser: Soap and Water 1 x Per Day/30 Days Discharge Instructions: May shower and wash wound with dial antibacterial soap and water prior to dressing change. Cleanser: Wound Cleanser 1 x Per Day/30 Days Discharge Instructions: Cleanse the wound with wound cleanser prior to applying a clean dressing using gauze sponges, not tissue or cotton balls. Topical: Mupirocin Ointment 1 x Per Day/30 Days Discharge Instructions: Apply Mupirocin (Bactroban) as instructed Prim Dressing: Maxorb Extra Ag+ Alginate Dressing, 2x2 (in/in) (DME) (Generic) 1 x Per Day/30 Days ary Discharge Instructions: Apply to wound bed as instructed Secondary Dressing: T Non-Adherent Dressing, 3x4 in (DME) (Generic) 1 x  Per Day/30 Days elfa Discharge Instructions: Apply over primary dressing as directed. Secondary Dressing: Woven Gauze Sponge, Non-Sterile 4x4 in (DME) (Generic) 1 x Per Day/30 Days Discharge Instructions: Apply over primary dressing as directed. Secured With: 60M Medipore H Soft Cloth Surgical T ape, 4 x 10 (in/yd) (DME) (Generic) 1 x Per Day/30 Days Discharge Instructions: Secure with tape as directed. Wound #16 - Abdomen - Upper Quadrant Wound Laterality: Left Cleanser: Soap and Water 1 x Per Day/30 Days Discharge Instructions: May shower and wash wound with dial antibacterial soap and water prior to dressing change. Cleanser: Wound Cleanser 1 x Per Day/30 Days Discharge Instructions: Cleanse the wound with wound cleanser prior to applying a clean dressing using gauze sponges, not tissue or cotton balls. Topical: Mupirocin Ointment 1 x Per Day/30 Days Discharge Instructions: Apply Mupirocin (Bactroban) as instructed [name], [name] ([id number]) [id number] Page 8 of 16 Prim Dressing: Maxorb Extra Ag+ Alginate Dressing, 2x2 (in/in) (DME) (Generic) 1 x Per Day/30 Days ary Discharge Instructions: Apply to wound bed as instructed Secondary Dressing: T Non-Adherent Dressing, 3x4 in (DME) (Generic) 1 x Per Day/30 Days elfa Discharge Instructions: Apply over primary dressing as directed. Secondary Dressing: Woven Gauze Sponge, Non-Sterile 4x4 in (DME) (Generic) 1 x Per Day/30 Days Discharge Instructions: Apply over primary dressing as directed. Secured With: 60M Medipore H Soft Cloth Surgical T ape, 4 x 10 (in/yd) (DME) (Generic) 1 x Per Day/30 Days Discharge Instructions: Secure with tape as directed. Wound #17 - Abdomen - Lower Quadrant Wound Laterality: Left Cleanser: Soap and Water 1 x Per Day/30 Days Discharge Instructions: May shower and wash wound with dial antibacterial soap and water prior to dressing change. Cleanser: Wound Cleanser 1 x Per Day/30  Days Discharge Instructions: Cleanse the wound with wound cleanser prior to applying a clean dressing using gauze sponges, not tissue or cotton balls. Topical: Mupirocin Ointment 1 x Per Day/30 Days Discharge Instructions:  Apply Mupirocin (Bactroban) as instructed Prim Dressing: Maxorb Extra Ag+ Alginate Dressing, 2x2 (in/in) (DME) (Generic) 1 x Per Day/30 Days ary Discharge Instructions: Apply to wound bed as instructed Secondary Dressing: T Non-Adherent Dressing, 3x4 in (DME) (Generic) 1 x Per Day/30 Days elfa Discharge Instructions: Apply over primary dressing as directed. Secondary Dressing: Woven Gauze Sponge, Non-Sterile 4x4 in (DME) (Generic) 1 x Per Day/30 Days Discharge Instructions: Apply over primary dressing as directed. Secured With: 65M Medipore H Soft Cloth Surgical T ape, 4 x 10 (in/yd) (DME) (Generic) 1 x Per Day/30 Days Discharge Instructions: Secure with tape as directed. Wound #18 - Upper Leg Wound Laterality: Right, Lateral Cleanser: Soap and Water 1 x Per Day/30 Days Discharge Instructions: May shower and wash wound with dial antibacterial soap and water prior to dressing change. Cleanser: Wound Cleanser 1 x Per Day/30 Days Discharge Instructions: Cleanse the wound with wound cleanser prior to applying a clean dressing using gauze sponges, not tissue or cotton balls. Topical: Mupirocin Ointment 1 x Per Day/30 Days Discharge Instructions: Apply Mupirocin (Bactroban) as instructed Prim Dressing: Maxorb Extra Ag+ Alginate Dressing, 2x2 (in/in) (DME) (Generic) 1 x Per Day/30 Days ary Discharge Instructions: Apply to wound bed as instructed Secondary Dressing: T Non-Adherent Dressing, 3x4 in (DME) (Generic) 1 x Per Day/30 Days elfa Discharge Instructions: Apply over primary dressing as directed. Secondary Dressing: Woven Gauze Sponge, Non-Sterile 4x4 in (DME) (Generic) 1 x Per Day/30 Days Discharge Instructions: Apply over primary dressing as directed. Secured With: 65M  Medipore H Soft Cloth Surgical T ape, 4 x 10 (in/yd) (DME) (Generic) 1 x Per Day/30 Days Discharge Instructions: Secure with tape as directed. Wound #8 - Lower Leg Wound Laterality: Right, Medial Cleanser: Soap and Water 1 x Per Day/30 Days Discharge Instructions: May shower and wash wound with dial antibacterial soap and water prior to dressing change. Cleanser: Wound Cleanser 1 x Per Day/30 Days Discharge Instructions: Cleanse the wound with wound cleanser prior to applying a clean dressing using gauze sponges, not tissue or cotton balls. Topical: Mupirocin Ointment 1 x Per Day/30 Days Discharge Instructions: Apply Mupirocin (Bactroban) as instructed Prim Dressing: Maxorb Extra Ag+ Alginate Dressing, 2x2 (in/in) (DME) (Generic) 1 x Per Day/30 Days ary Discharge Instructions: Apply to wound bed as instructed Secondary Dressing: T Non-Adherent Dressing, 3x4 in (DME) (Generic) 1 x Per Day/30 Days elfa Discharge Instructions: Apply over primary dressing as directed. Secondary Dressing: Woven Gauze Sponge, Non-Sterile 4x4 in (DME) (Generic) 1 x Per Day/30 Days Discharge Instructions: Apply over primary dressing as directed. Secured With: 65M Medipore H Soft Cloth Surgical Tape, 4 x 10 (in/yd) (DME) (Generic) 1 x Per Day/30 Days AAZIYAH, NEGRON [name] ([id number]) [id number] Page 9 of 16 Discharge Instructions: Secure with tape as directed. Patient Medications llergies: gloves, latex A Notifications Medication Indication Start End 02/05/2023 lidocaine DOSE topical 4 % cream - cream topical Electronic Signature(s) Signed: 02/05/2023 10:51:19 AM By: Duanne Guess MD FACS Entered By: Duanne Guess on 02/05/2023 10:38:49 -------------------------------------------------------------------------------- Problem List Details Patient Name: Date of Service: [name], MA [name]. 02/05/2023 10:00 A M Medical Record Number: [id number] Patient Account Number:  [id number] Date of Birth/Sex: Treating RN: 10-21-2000 (22 y.o. 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 05/23/2022 No Yes Q87.11 Prader-Willi syndrome 05/23/2022 No Yes Z68.45 Body mass index [BMI] 70 or greater, adult 05/23/2022 No Yes Inactive Problems Resolved Problems ICD-10 Code Description Active Date Resolved Date [name], [name] ([id number]) [id number] Page 10 of [phone] Non-pressure chronic ulcer of other part of left lower leg with fat layer exposed 12/22/2022 09/22/2022 Electronic Signature(s) Signed: 02/05/2023 10:33:44 AM By: Duanne Guess MD FACS Entered By: Duanne Guess on 02/05/2023 10:33:44 -------------------------------------------------------------------------------- Progress Note Details Patient Name: Date of Service: [name], MA [name]. 02/05/2023 10:00 A M Medical Record Number: [id number] Patient Account Number: [id number] Date of Birth/Sex: Treating RN: Feb 12, 2001 (22 y.o. F) Primary Care Provider: Enid Skeens Other Clinician: Referring Provider: Treating Provider/Extender: Arther Dames in Treatment: 36 Subjective Chief Complaint Information obtained from Patient Patient seen for complaints of Non-Healing Wound. History of Present Illness (HPI) ADMISSION 05/23/2022 This is a 22 year old young woman  with Prader-Willi syndrome and the usual accompanying super morbid obesity, diabetes, and skin picking disorder. She picked a large wound in her right thigh that resulted in an emergency department visit on 17 February. Apparently her father was cleaning the wound with mupirocin and hydrogen peroxide. A culture was taken that grew out Proteus and methicillin sensitive Staph aureus. She was prescribed levofloxacin. She saw her primary care provider a few days later and based on the depth and appearance of the wound, she was referred to the wound care center for further evaluation and management. There is a large area on the patient's upper right thigh that is excoriated and scaly with dry skin that has obviously been picked at. The primary wound is 4-1/2 cm in depth. It is much cleaner than described in the ED notes. There is also a linear ulcer adjacent to this deep site with slough accumulation. 3/15; patient presents for follow-up. She has been using iodoform packing followed by silver alginate to the wound bed. Mother is present during the encounter. There is been improvement in wound healing. 06/23/2022: The depth of the wound has come in further. The periwound skin is in improved condition with very few open areas. 07/21/2022: The wound continues to contract. Her periwound skin has completely healed. 08/18/2022: The wound measured a couple of millimeters deeper today. No concern for infection. 09/22/2022: Unfortunately, over the past month, [name] has engaged in significant skin picking activity. She has wounds on each of her wrists, 1 on her right dorsal hand, 1 on her left shoulder, and 1 on her left anterior tibial surface. The initial wound on her right thigh is a little bit shallower. No concern for infection at any of the sites. 10/20/2022: The right wrist wound, the left wrist wound, the left shoulder wound, and the right thigh wound are healed. She has picked new wounds on her right lower leg  and the left dorsal hand. There is slough and eschar on all of the wound surfaces. 11/20/2022: The left knee wound is healed. She has reopened the wounds on her right hand and left wrist. The shoulder remains healed. She also has a new wound on her abdomen. 12/22/2022: The wounds on her right hand are healed. She has 2 open wounds on her left hand. The wound on her left lower abdomen has healed  but she has opened up a new 1 on the right lower abdomen. The left knee has reopened. The right lower leg wounds are smaller, but she has a new 1 on the lateral aspect of her right lower leg. There is slough and eschar accumulation on the wound surfaces. 02/05/2023: She has a new wound on the dorsal aspect of her right index finger. She has a cluster of 3 new wounds in her left upper abdominal quadrant and one new wound on her left lower abdominal quadrant. The right thigh wound has reopened, but it is superficial and secondary to picking. The right medial lower leg wound is smaller. She has opened up her left upper arm again. Patient History Information obtained from Caregiver. Family History Hypertension - Father, Thyroid Problems - Father, No family history of Cancer, Diabetes, Heart Disease, Hereditary Spherocytosis, Kidney Disease, Lung Disease, Seizures, Stroke, Tuberculosis. Social History Never smoker, Marital Status - Single, Alcohol Use - Never, Drug Use - No History, Caffeine Use - Never. Medical History Endocrine Patient has history of Type II Diabetes [name], [name] ([id number]) [id number] Page 11 of 16 Hospitalization/Surgery History - knee surgery. - myringotomy. Medical A Surgical History Notes nd Constitutional Symptoms (General Health) obese Musculoskeletal scoliosis Neurologic Prader-willi syndrome Psychiatric compulsive skin picking Objective Constitutional Hypertensive, asymptomatic. no acute distress. Vitals Time Taken: 10:02 AM, Height: 60  in, Weight: 380 lbs, BMI: 74.2, Temperature: 97.7 F, Pulse: 82 bpm, Respiratory Rate: 18 breaths/min, Blood Pressure: 163/76 mmHg. Respiratory Normal work of breathing on room air.. General Notes: 02/05/2023: She has a new wound on the dorsal aspect of her right index finger. She has a cluster of 3 new wounds in her left upper abdominal quadrant and one new wound on her left lower abdominal quadrant. The right thigh wound has reopened, but it is superficial and secondary to picking. The right medial lower leg wound is smaller. She has opened up her left upper arm again. Integumentary (Hair, Skin) Wound #11 status is Open. Original cause of wound was Trauma. The date acquired was: 11/26/2022. The wound has been in treatment 6 weeks. The wound is located on the Right Abdomen - Upper Quadrant. The wound measures 0cm length x 0cm width x 0cm depth; 0cm^2 area and 0cm^3 volume. There is no tunneling or undermining noted. There is a none present amount of drainage noted. The wound margin is distinct with the outline attached to the wound base. There is no granulation within the wound bed. There is no necrotic tissue within the wound bed. The periwound skin appearance had no abnormalities noted for moisture. The periwound skin appearance had no abnormalities noted for color. The periwound skin appearance exhibited: Scarring. Periwound temperature was noted as No Abnormality. Wound #12 status is Open. Original cause of wound was Skin T ear/Laceration. The date acquired was: 12/22/2022. The wound has been in treatment 6 weeks. The wound is located on the Right,Lateral Lower Leg. The wound measures 0cm length x 0cm width x 0cm depth; 0cm^2 area and 0cm^3 volume. There is no tunneling or undermining noted. There is a none present amount of drainage noted. The wound margin is distinct with the outline attached to the wound base. There is no granulation within the wound bed. There is no necrotic tissue within the  wound bed. The periwound skin appearance had no abnormalities noted for moisture. The periwound skin appearance had no abnormalities noted for color. The periwound skin appearance exhibited: Scarring. Periwound temperature was noted as No Abnormality. Wound #13  status is Open. Original cause of wound was Skin T ear/Laceration. The date acquired was: 12/22/2022. The wound has been in treatment 6 weeks. The wound is located on the Left,Anterior Lower Leg. The wound measures 0cm length x 0cm width x 0cm depth; 0cm^2 area and 0cm^3 volume. There is no tunneling or undermining noted. There is a none present amount of drainage noted. The wound margin is distinct with the outline attached to the wound base. There is no granulation within the wound bed. There is no necrotic tissue within the wound bed. The periwound skin appearance had no abnormalities noted for moisture. The periwound skin appearance had no abnormalities noted for color. The periwound skin appearance exhibited: Scarring. Periwound temperature was noted as No Abnormality. Wound #14 status is Open. Original cause of wound was Skin T ear/Laceration. The date acquired was: 01/27/2023. The wound is located on the Right Hand - Dorsum. The wound measures 2cm length x 0.7cm width x 0.1cm depth; 1.1cm^2 area and 0.11cm^3 volume. There is Fat Layer (Subcutaneous Tissue) exposed. There is no tunneling or undermining noted. There is a medium amount of serous drainage noted. The wound margin is distinct with the outline attached to the wound base. There is small (1-33%) red, pink granulation within the wound bed. There is a large (67-100%) amount of necrotic tissue within the wound bed including Adherent Slough. The periwound skin appearance had no abnormalities noted for texture. The periwound skin appearance had no abnormalities noted for moisture. The periwound skin appearance had no abnormalities noted for color. Periwound temperature was noted as No  Abnormality. Wound #15 status is Open. Original cause of wound was Skin T ear/Laceration. The date acquired was: 01/27/2023. The wound is located on the Left Upper Arm. The wound measures 4cm length x 6cm width x 0.1cm depth; 18.85cm^2 area and 1.885cm^3 volume. There is Fat Layer (Subcutaneous Tissue) exposed. There is no tunneling or undermining noted. There is a medium amount of serosanguineous drainage noted. The wound margin is distinct with the outline attached to the wound base. There is medium (34-66%) pink granulation within the wound bed. There is a medium (34-66%) amount of necrotic tissue within the wound bed including Eschar and Adherent Slough. The periwound skin appearance had no abnormalities noted for moisture. The periwound skin appearance had no abnormalities noted for color. The periwound skin appearance exhibited: Scarring. Periwound temperature was noted as No Abnormality. Wound #16 status is Open. Original cause of wound was Skin T ear/Laceration. The date acquired was: 01/27/2023. The wound is located on the Left Abdomen - Upper Quadrant. The wound measures 4cm length x 3cm width x 0.1cm depth; 9.425cm^2 area and 0.942cm^3 volume. There is Fat Layer (Subcutaneous Tissue) exposed. There is no tunneling or undermining noted. There is a medium amount of serosanguineous drainage noted. The wound margin is distinct with the outline attached to the wound base. There is large (67-100%) pink granulation within the wound bed. There is a small (1-33%) amount of necrotic tissue within the wound bed including Adherent Slough. The periwound skin appearance had no abnormalities noted for moisture. The periwound skin appearance had no abnormalities noted for color. The periwound skin appearance exhibited: Scarring. The periwound skin appearance did not exhibit: Rash. Periwound temperature was noted as No Abnormality. Wound #17 status is Open. Original cause of wound was Skin T ear/Laceration.  The date acquired was: 01/27/2023. The wound is located on the Left Abdomen - Lower Quadrant. The wound measures 0.6cm length x 0.4cm width x  0.1cm depth; 0.188cm^2 area and 0.019cm^3 volume. There is Fat Layer (Subcutaneous Tissue) exposed. There is no tunneling or undermining noted. There is a medium amount of serosanguineous drainage noted. The wound margin is distinct with the outline attached to the wound base. There is medium (34-66%) red, pink granulation within the wound bed. There is a medium (34-66%) amount of necrotic tissue within the wound bed including Adherent Slough. The periwound skin appearance had no abnormalities noted for moisture. The periwound skin appearance had no abnormalities noted for color. The periwound skin appearance exhibited: Scarring. Periwound temperature was noted as No Abnormality. [name], [name] ([id number]) [id number] Page 12 of 16 Wound #18 status is Open. Original cause of wound was Skin T ear/Laceration. The date acquired was: 01/27/2023. The wound is located on the Right,Lateral Upper Leg. The wound measures 4.4cm length x 3cm width x 0.1cm depth; 10.367cm^2 area and 1.037cm^3 volume. There is Fat Layer (Subcutaneous Tissue) exposed. There is no tunneling or undermining noted. There is a medium amount of serosanguineous drainage noted. The wound margin is distinct with the outline attached to the wound base. There is medium (34-66%) red, pink granulation within the wound bed. There is a medium (34-66%) amount of necrotic tissue within the wound bed including Adherent Slough. The periwound skin appearance had no abnormalities noted for moisture. The periwound skin appearance had no abnormalities noted for color. The periwound skin appearance exhibited: Scarring. Periwound temperature was noted as No Abnormality. Wound #3 status is Open. Original cause of wound was Skin T ear/Laceration. The date acquired was: 09/08/2022. The wound  has been in treatment 19 weeks. The wound is located on the Left Hand - Dorsum. The wound measures 0cm length x 0cm width x 0cm depth; 0cm^2 area and 0cm^3 volume. There is no tunneling or undermining noted. There is a none present amount of drainage noted. The wound margin is distinct with the outline attached to the wound base. There is no granulation within the wound bed. There is no necrotic tissue within the wound bed. The periwound skin appearance had no abnormalities noted for moisture. The periwound skin appearance had no abnormalities noted for color. The periwound skin appearance exhibited: Scarring. Periwound temperature was noted as No Abnormality. Wound #7 status is Open. Original cause of wound was Skin T ear/Laceration. The date acquired was: 09/25/2022. The wound has been in treatment 15 weeks. The wound is located on the Right,Anterior Lower Leg. The wound measures 0cm length x 0cm width x 0cm depth; 0cm^2 area and 0cm^3 volume. There is no tunneling or undermining noted. There is a none present amount of drainage noted. The wound margin is distinct with the outline attached to the wound base. There is no granulation within the wound bed. There is no necrotic tissue within the wound bed. The periwound skin appearance had no abnormalities noted for moisture. The periwound skin appearance exhibited: Scarring, Rubor. Periwound temperature was noted as No Abnormality. Wound #8 status is Open. Original cause of wound was Skin T ear/Laceration. The date acquired was: 09/25/2022. The wound has been in treatment 15 weeks. The wound is located on the Right,Medial Lower Leg. The wound measures 0.5cm length x 0.5cm width x 0.1cm depth; 0.196cm^2 area and 0.02cm^3 volume. There is Fat Layer (Subcutaneous Tissue) exposed. There is no tunneling or undermining noted. There is a medium amount of serosanguineous drainage noted. The wound margin is distinct with the outline attached to the wound base. There  is medium (34-66%) red granulation within the  wound bed. There is a medium (34- 66%) amount of necrotic tissue within the wound bed including Eschar. The periwound skin appearance had no abnormalities noted for moisture. The periwound skin appearance had no abnormalities noted for color. The periwound skin appearance did not exhibit: Scarring. Periwound temperature was noted as No Abnormality. Assessment Active Problems ICD-10 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osed Non-pressure chronic ulcer of skin 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 Non-pressure chronic ulcer of right thigh with fat layer exposed Type 2 diabetes mellitus with hyperglycemia Excoriation (skin-picking) disorder Prader-Willi syndrome Body mass index [BMI] 70 or greater, adult Procedures Wound #14 Pre-procedure diagnosis of Wound #14 is a Skin T located on the Right Hand - Dorsum . There was a Excisional Skin/Subcutaneous Tissue Debridement with ear a total area of 1.1 sq cm performed by Duanne Guess, MD. With the following instrument(s): Curette to remove Non-Viable tissue/material. Material removed includes Subcutaneous Tissue and Slough and after achieving pain control using Lidocaine 4% T opical Solution. No specimens were taken. A time out was conducted at 10:27, prior to the start of the procedure. A Minimum amount of bleeding was controlled with Pressure. The procedure was tolerated well. Post Debridement Measurements: 2cm length x 0.7cm width x 0.1cm depth; 0.11cm^3 volume. Character of Wound/Ulcer Post Debridement is improved. Post procedure Diagnosis Wound #14: Same as Pre-Procedure Wound #15 Pre-procedure diagnosis of Wound #15 is a Skin T located on the Left Upper Arm . There was a Excisional Skin/Subcutaneous Tissue Debridement with a ear total area of 15.07 sq cm performed by Duanne Guess, MD. With the following instrument(s): Curette to remove Non-Viable  tissue/material. Material removed includes Eschar, Subcutaneous Tissue, and Slough after achieving pain control using Lidocaine 4% T opical Solution. No specimens were taken. A time out was conducted at 10:27, prior to the start of the procedure. A Minimum amount of bleeding was controlled with Pressure. The procedure was tolerated well. Post Debridement Measurements: 4cm length x 6cm width x 0.1cm depth; 1.885cm^3 volume. Character of Wound/Ulcer Post Debridement is improved. Post procedure Diagnosis Wound #15: Same as Pre-Procedure Wound #16 Pre-procedure diagnosis of Wound #16 is an Abrasion located on the Left Abdomen - Upper Quadrant . There was a Excisional Skin/Subcutaneous Tissue Debridement with a total area of 9.42 sq cm performed by Duanne Guess, MD. With the following instrument(s): Curette to remove Non-Viable tissue/material. Material removed includes Subcutaneous Tissue and Slough and after achieving pain control using Lidocaine 4% T opical Solution. No specimens were taken. A time out was conducted at 10:27, prior to the start of the procedure. A Minimum amount of bleeding was controlled with Pressure. The procedure was tolerated well. Post Debridement Measurements: 4cm length x 3cm width x 0.1cm depth; 0.942cm^3 volume. Character of Wound/Ulcer Post Debridement is improved. Post procedure Diagnosis Wound #16: Same as Pre-Procedure Wound #17 Pre-procedure diagnosis of Wound #17 is an Abrasion located on the Left Abdomen - Lower Quadrant . There was a Excisional Skin/Subcutaneous Tissue Debridement with a total area of 0.19 sq cm performed by Duanne Guess, MD. With the following instrument(s): Curette to remove Non-Viable tissue/material. Material removed includes Subcutaneous Tissue and Slough and after achieving pain control using Lidocaine 4% T opical Solution. No specimens were taken. A time out was conducted at 10:27, prior to the start of the procedure. A Minimum amount  of bleeding was controlled with Pressure. The procedure was tolerated well. Post Debridement Measurements: 0.6cm length x 0.4cm width  x 0.1cm depth; 0.019cm^3 volume. Character of Wound/Ulcer Post Debridement is improved. Post procedure Diagnosis Wound #17: Same as Pre-Procedure EDNITA, OUSLEY [name] ([id number]) [id number] Page 13 of 16 Wound #18 Pre-procedure diagnosis of Wound #18 is a Diabetic Wound/Ulcer of the Lower Extremity located on the Right,Lateral Upper Leg .Severity of Tissue Pre Debridement is: Fat layer exposed. There was a Excisional Skin/Subcutaneous Tissue Debridement with a total area of 10.36 sq cm performed by Duanne Guess, MD. With the following instrument(s): Curette to remove Non-Viable tissue/material. Material removed includes Eschar, Subcutaneous Tissue, and Slough after achieving pain control using Lidocaine 4% T opical Solution. No specimens were taken. A time out was conducted at 10:27, prior to the start of the procedure. A Minimum amount of bleeding was controlled with Pressure. The procedure was tolerated well. Post Debridement Measurements: 4.4cm length x 3cm width x 0.1cm depth; 1.037cm^3 volume. Character of Wound/Ulcer Post Debridement is improved. Severity of Tissue Post Debridement is: Fat layer exposed. Post procedure Diagnosis Wound #18: Same as Pre-Procedure Wound #8 Pre-procedure diagnosis of Wound #8 is an Abrasion located on the Right,Medial Lower Leg . There was a Excisional Skin/Subcutaneous Tissue Debridement with a total area of 0.2 sq cm performed by Duanne Guess, MD. With the following instrument(s): Curette to remove Non-Viable tissue/material. Material removed includes Eschar and Subcutaneous Tissue and after achieving pain control using Lidocaine 4% T opical Solution. No specimens were taken. A time out was conducted at 10:27, prior to the start of the procedure. A Minimum amount of bleeding was controlled with  Pressure. The procedure was tolerated well. Post Debridement Measurements: 0.5cm length x 0.5cm width x 0.1cm depth; 0.02cm^3 volume. Character of Wound/Ulcer Post Debridement is improved. Post procedure Diagnosis Wound #8: Same as Pre-Procedure Plan Follow-up Appointments: Return appointment in 1 month. - Dr. Lady Gary - room 2 Anesthetic: (In clinic) Topical Lidocaine 4% applied to wound bed Bathing/ Shower/ Hygiene: May shower and wash wound with soap and water. The following medication(s) was prescribed: lidocaine topical 4 % cream cream topical was prescribed at facility WOUND #14: - Hand - Dorsum Wound Laterality: Right Cleanser: Soap and Water 1 x Per Day/30 Days Discharge Instructions: May shower and wash wound with dial antibacterial soap and water prior to dressing change. Cleanser: Wound Cleanser 1 x Per Day/30 Days Discharge Instructions: Cleanse the wound with wound cleanser prior to applying a clean dressing using gauze sponges, not tissue or cotton balls. Topical: Mupirocin Ointment 1 x Per Day/30 Days Discharge Instructions: Apply Mupirocin (Bactroban) as instructed Prim Dressing: Maxorb Extra Ag+ Alginate Dressing, 2x2 (in/in) (DME) (Generic) 1 x Per Day/30 Days ary Discharge Instructions: Apply to wound bed as instructed Secondary Dressing: T Non-Adherent Dressing, 3x4 in (DME) (Generic) 1 x Per Day/30 Days elfa Discharge Instructions: Apply over primary dressing as directed. Secondary Dressing: Woven Gauze Sponge, Non-Sterile 4x4 in (DME) (Generic) 1 x Per Day/30 Days Discharge Instructions: Apply over primary dressing as directed. Secured With: 10M Medipore H Soft Cloth Surgical T ape, 4 x 10 (in/yd) (DME) (Generic) 1 x Per Day/30 Days Discharge Instructions: Secure with tape as directed. WOUND #15: - Upper Arm Wound Laterality: Left Cleanser: Soap and Water 1 x Per Day/30 Days Discharge Instructions: May shower and wash wound with dial antibacterial soap and water  prior to dressing change. Cleanser: Wound Cleanser 1 x Per Day/30 Days Discharge Instructions: Cleanse the wound with wound cleanser prior to applying a clean dressing using gauze sponges, not tissue or cotton balls. Topical: Mupirocin Ointment 1  x Per Day/30 Days Discharge Instructions: Apply Mupirocin (Bactroban) as instructed Prim Dressing: Maxorb Extra Ag+ Alginate Dressing, 2x2 (in/in) (DME) (Generic) 1 x Per Day/30 Days ary Discharge Instructions: Apply to wound bed as instructed Secondary Dressing: T Non-Adherent Dressing, 3x4 in (DME) (Generic) 1 x Per Day/30 Days elfa Discharge Instructions: Apply over primary dressing as directed. Secondary Dressing: Woven Gauze Sponge, Non-Sterile 4x4 in (DME) (Generic) 1 x Per Day/30 Days Discharge Instructions: Apply over primary dressing as directed. Secured With: 57M Medipore H Soft Cloth Surgical T ape, 4 x 10 (in/yd) (DME) (Generic) 1 x Per Day/30 Days Discharge Instructions: Secure with tape as directed. WOUND #16: - Abdomen - Upper Quadrant Wound Laterality: Left Cleanser: Soap and Water 1 x Per Day/30 Days Discharge Instructions: May shower and wash wound with dial antibacterial soap and water prior to dressing change. Cleanser: Wound Cleanser 1 x Per Day/30 Days Discharge Instructions: Cleanse the wound with wound cleanser prior to applying a clean dressing using gauze sponges, not tissue or cotton balls. Topical: Mupirocin Ointment 1 x Per Day/30 Days Discharge Instructions: Apply Mupirocin (Bactroban) as instructed Prim Dressing: Maxorb Extra Ag+ Alginate Dressing, 2x2 (in/in) (DME) (Generic) 1 x Per Day/30 Days ary Discharge Instructions: Apply to wound bed as instructed Secondary Dressing: T Non-Adherent Dressing, 3x4 in (DME) (Generic) 1 x Per Day/30 Days elfa Discharge Instructions: Apply over primary dressing as directed. Secondary Dressing: Woven Gauze Sponge, Non-Sterile 4x4 in (DME) (Generic) 1 x Per Day/30 Days Discharge  Instructions: Apply over primary dressing as directed. Secured With: 57M Medipore H Soft Cloth Surgical T ape, 4 x 10 (in/yd) (DME) (Generic) 1 x Per Day/30 Days Discharge Instructions: Secure with tape as directed. WOUND #17: - Abdomen - Lower Quadrant Wound Laterality: Left Cleanser: Soap and Water 1 x Per Day/30 Days Discharge Instructions: May shower and wash wound with dial antibacterial soap and water prior to dressing change. Cleanser: Wound Cleanser 1 x Per Day/30 Days Discharge Instructions: Cleanse the wound with wound cleanser prior to applying a clean dressing using gauze sponges, not tissue or cotton balls. Topical: Mupirocin Ointment 1 x Per Day/30 Days Discharge Instructions: Apply Mupirocin (Bactroban) as instructed Prim Dressing: Maxorb Extra Ag+ Alginate Dressing, 2x2 (in/in) (DME) (Generic) 1 x Per Day/30 Days ary Discharge Instructions: Apply to wound bed as instructed Secondary Dressing: T Non-Adherent Dressing, 3x4 in (DME) (Generic) 1 x Per Day/30 Days elfa [name], [name] [name] ([id number]) [id number] Page 14 of 16 Discharge Instructions: Apply over primary dressing as directed. Secondary Dressing: Woven Gauze Sponge, Non-Sterile 4x4 in (DME) (Generic) 1 x Per Day/30 Days Discharge Instructions: Apply over primary dressing as directed. Secured With: 57M Medipore H Soft Cloth Surgical T ape, 4 x 10 (in/yd) (DME) (Generic) 1 x Per Day/30 Days Discharge Instructions: Secure with tape as directed. WOUND #18: - Upper Leg Wound Laterality: Right, Lateral Cleanser: Soap and Water 1 x Per Day/30 Days Discharge Instructions: May shower and wash wound with dial antibacterial soap and water prior to dressing change. Cleanser: Wound Cleanser 1 x Per Day/30 Days Discharge Instructions: Cleanse the wound with wound cleanser prior to applying a clean dressing using gauze sponges, not tissue or cotton balls. Topical: Mupirocin Ointment 1 x Per Day/30  Days Discharge Instructions: Apply Mupirocin (Bactroban) as instructed Prim Dressing: Maxorb Extra Ag+ Alginate Dressing, 2x2 (in/in) (DME) (Generic) 1 x Per Day/30 Days ary Discharge Instructions: Apply to wound bed as instructed Secondary Dressing: T Non-Adherent Dressing, 3x4 in (DME) (Generic) 1 x Per Day/30 Days  elfa Discharge Instructions: Apply over primary dressing as directed. Secondary Dressing: Woven Gauze Sponge, Non-Sterile 4x4 in (DME) (Generic) 1 x Per Day/30 Days Discharge Instructions: Apply over primary dressing as directed. Secured With: 26M Medipore H Soft Cloth Surgical T ape, 4 x 10 (in/yd) (DME) (Generic) 1 x Per Day/30 Days Discharge Instructions: Secure with tape as directed. WOUND #8: - Lower Leg Wound Laterality: Right, Medial Cleanser: Soap and Water 1 x Per Day/30 Days Discharge Instructions: May shower and wash wound with dial antibacterial soap and water prior to dressing change. Cleanser: Wound Cleanser 1 x Per Day/30 Days Discharge Instructions: Cleanse the wound with wound cleanser prior to applying a clean dressing using gauze sponges, not tissue or cotton balls. Topical: Mupirocin Ointment 1 x Per Day/30 Days Discharge Instructions: Apply Mupirocin (Bactroban) as instructed Prim Dressing: Maxorb Extra Ag+ Alginate Dressing, 2x2 (in/in) (DME) (Generic) 1 x Per Day/30 Days ary Discharge Instructions: Apply to wound bed as instructed Secondary Dressing: T Non-Adherent Dressing, 3x4 in (DME) (Generic) 1 x Per Day/30 Days elfa Discharge Instructions: Apply over primary dressing as directed. Secondary Dressing: Woven Gauze Sponge, Non-Sterile 4x4 in (DME) (Generic) 1 x Per Day/30 Days Discharge Instructions: Apply over primary dressing as directed. Secured With: 26M Medipore H Soft Cloth Surgical T ape, 4 x 10 (in/yd) (DME) (Generic) 1 x Per Day/30 Days Discharge Instructions: Secure with tape as directed. 02/05/2023: She has a new wound on the dorsal aspect  of her right index finger. She has a cluster of 3 new wounds in her left upper abdominal quadrant and one new wound on her left lower abdominal quadrant. The right thigh wound has reopened, but it is superficial and secondary to picking. The right medial lower leg wound is smaller. She has opened up her left upper arm again. I used a curette to debride slough and subcutaneous tissue from all of her new wounds as well as the remaining open wounds. Some of these also had eschar that was removed. We will continue topical treatment with mupirocin and silver alginate. Her father says they are going to meet with her psychiatric team to discuss medication adjustments that may minimize her skin picking behavior. They will follow-up in 1 month. Electronic Signature(s) Signed: 02/05/2023 10:39:56 AM By: Duanne Guess MD FACS Entered By: Duanne Guess on 02/05/2023 10:39:56 -------------------------------------------------------------------------------- HxROS Details Patient Name: Date of Service: [name], MA [name]. 02/05/2023 10:00 A M Medical Record Number: [id number] Patient Account Number: [id number] Date of Birth/Sex: Treating RN: 2000/11/17 (22 y.o. F) Primary Care Provider: Enid Skeens Other Clinician: Referring Provider: Treating Provider/Extender: Arther Dames in Treatment: 36 Information Obtained From Caregiver Constitutional Symptoms (General Health) Medical History: Past Medical History Notes: obese Endocrine Medical History: Positive for: Type II Diabetes Elia, Kratky Cherene [name] ([id number]) [id number] Page 15 of 16 Time with diabetes: 3 yrs Treated with: Insulin Blood sugar tested every day: Yes Tested : Musculoskeletal Medical History: Past Medical History Notes: scoliosis Neurologic Medical History: Past Medical History Notes: Prader-willi syndrome Psychiatric Medical History: Past Medical History  Notes: compulsive skin picking Immunizations Pneumococcal Vaccine: Received Pneumococcal Vaccination: No Implantable Devices None Hospitalization / Surgery History Type of Hospitalization/Surgery knee surgery myringotomy Family and Social History Cancer: No; Diabetes: No; Heart Disease: No; Hereditary Spherocytosis: No; Hypertension: Yes - Father; Kidney Disease: No; Lung Disease: No; Seizures: No; Stroke: No; Thyroid Problems: Yes - Father; Tuberculosis: No; Never smoker; Marital Status - Single; Alcohol Use: Never; Drug Use: No History; Caffeine Use:  Never; Financial Concerns: No; Food, Clothing or Shelter Needs: No; Support System Lacking: No; Transportation Concerns: No Electronic Signature(s) Signed: 02/05/2023 10:51:19 AM By: Duanne Guess MD FACS Entered By: Duanne Guess on 02/05/2023 10:37:58 -------------------------------------------------------------------------------- SuperBill Details Patient Name: Date of Service: [name], MA [name]. 02/05/2023 Medical Record Number: [id number] Patient Account Number: [id number] Date of Birth/Sex: Treating RN: 06/15/2000 (22 y.o. 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 RAJAH, SERA [name] ([id number]) [id number] Page 16 of 16 Facility Procedures : CPT4 Code: [id number] Description: 11042 - DEB SUBQ TISSUE 20 SQ CM/< ICD-10  Diagnosis Description L97.812 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 L98.492 Non-pressure chronic ulcer of skin 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 L97.112  Non-pressure chronic ulcer of right thigh with fat layer exposed Modifier: osed Quantity: 1 : CPT4 Code: [id number] Description: 11045 - DEB SUBQ TISS EA ADDL 20CM ICD-10 Diagnosis Description L97.812 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 L98.492 Non-pressure chronic ulcer of skin 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 L97.112  Non-pressure chronic ulcer of right thigh with fat layer exposed Modifier: osed Quantity: 1 Physician Procedures : CPT4 Code Description Modifier [id number] 99214 - WC PHYS LEVEL 4 - EST PT ICD-10 Diagnosis Description L97.812 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osed L98.492 Non-pressure chronic ulcer of skin of other sites  with fat layer exposed 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 Quantity: 1 : [id number] 11042 - WC PHYS SUBQ TISS 20 SQ CM ICD-10 Diagnosis Description L97.812 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osed L98.492 Non-pressure chronic ulcer of skin 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 L97.112  Non-pressure chronic ulcer of right thigh with fat layer exposed Quantity: 1 : [id number] 11045 - WC PHYS SUBQ TISS EA ADDL 20 CM ICD-10 Diagnosis Description L97.812 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osed L98.492 Non-pressure chronic ulcer of skin 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  L97.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 Quantity: 1 Electronic Signature(s) Signed: 02/05/2023 10:40:33 AM By: Duanne Guess MD FACS Entered By: Duanne Guess on 02/05/2023 10:40:33

## 2023-02-05 NOTE — Progress Notes (Signed)
[name] ([id number]) [id number] Page 1 of 20 Visit Report for 02/05/2023 Arrival Information Details Patient Name: Date of Service: DO [place], [place]. 02/05/2023 10:00 A M Medical Record Number: [id number] Patient Account Number: [id number] Date of Birth/Sex: Treating RN: [birth year] (22 y.o. [name], [name] Primary Care Korde Jeppsen: Enid Skeens Other Clinician: Referring Lynden Carrithers: Treating Harland Aguiniga/Extender: Arther Dames in Treatment: 36 Visit Information History Since Last Visit Added or deleted any medications: No Patient Arrived: [name] Any new allergies or adverse reactions: No Arrival Time: 09:55 Had a fall or experienced change in No Accompanied By: dad activities of daily living that may affect Transfer Assistance: Manual risk of falls: Patient Identification Verified: Yes Signs or symptoms of abuse/neglect since last visito No Secondary Verification Process Completed: Yes Hospitalized since last visit: No Patient Requires Transmission-Based Precautions: No Implantable device outside of the clinic excluding No cellular tissue based products placed in the center since last visit: Has Dressing in Place as Prescribed: Yes Pain Present Now: No Electronic Signature(s) Signed: 02/05/2023 4:15:11 PM By: Samuella Bruin Entered By: Samuella Bruin on 02/05/2023 07:02:09 -------------------------------------------------------------------------------- Encounter Discharge Information Details Patient Name: Date of Service: DO [name], [name]. 02/05/2023 10:00 A M Medical Record Number: [id number] Patient Account Number: [id number] Date of Birth/Sex: Treating RN: [birth year] (22 y.o. [name] Primary Care Bricia Taher: Enid Skeens Other Clinician: Referring Ivis Nicolson: Treating Anael Rosch/Extender: Arther Dames in Treatment: 36 Encounter Discharge Information Items Post  Procedure Vitals Discharge Condition: Stable Temperature (F): 97.7 Ambulatory Status: Walker Pulse (bpm): 82 Discharge Destination: Home Respiratory Rate (breaths/min): 18 Transportation: Private Auto Blood Pressure (mmHg): 163/76 Accompanied By: father Schedule Follow-up Appointment: Yes Clinical Summary of Care: Patient Declined Electronic Signature(s) Signed: 02/05/2023 4:15:11 PM By: Samuella Bruin Entered By: Samuella Bruin on 02/05/2023 07:50:11 [name], [name] ([id number]) [id number] Page 2 of 20 -------------------------------------------------------------------------------- Lower Extremity Assessment Details Patient Name: Date of Service: DO [name], [name]. 02/05/2023 10:00 A M Medical Record Number: [id number] Patient Account Number: [id number] Date of Birth/Sex: Treating RN: 2000/05/11 (22 y.o. [name] Primary Care Flara Storti: Enid Skeens Other Clinician: Referring Adream Parzych: Treating Salena Ortlieb/Extender: Arther Dames in Treatment: 36 Electronic Signature(s) Signed: 02/05/2023 4:15:11 PM By: Gelene Mink By: Samuella Bruin on 02/05/2023 07:02:20 -------------------------------------------------------------------------------- Multi Wound Chart Details Patient Name: Date of Service: DO [name], [name]. 02/05/2023 10:00 A M Medical Record Number: [id number] Patient Account Number: [id number] Date of Birth/Sex: Treating RN: [birth year] (22 y.o. F) Primary Care [place]: Enid Skeens Other Clinician: Referring Claude Swendsen: Treating Alroy Portela/Extender: Arther Dames in Treatment: 36 Vital Signs Height(in): 60 Pulse(bpm): 82 Weight(lbs): 380 Blood Pressure(mmHg): 163/76 Body Mass Index(BMI): 74.2 Temperature(F): 97.7 Respiratory Rate(breaths/min): 18 [11:Photos: No Photos Right Abdomen - Upper Quadrant Wound Location: Trauma Wounding Event: Abrasion  Primary Etiology: Type II Diabetes Comorbid History: 11/26/2022 Date Acquired: 6 Weeks of Treatment: Open Wound Status: No Wound Recurrence: 0x0x0 Measurements L x W x  D (cm) 0 A (cm) : rea 0 Volume (cm) : 100.00% % Reduction in A rea: 100.00% % Reduction in Volume: Full Thickness Without Exposed Classification: Support Structures None Present Exudate A mount: N/A Exudate Type: N/A Exudate Color: Distinct, outline  attached Wound Margin: None Present (0%) Granulation Amount: N/A Granulation Quality: None Present (0%) Necrotic Amount: N/A Necrotic Tissue: Fascia: No Exposed Structures: Fat Layer (Subcutaneous Tissue): No Tendon: No Muscle: No Joint: No Bone: No  Large (67-100%)  Epithelialization: N/A Debridement: N/A Pain Control:] [12:No Photos Right, Lateral Lower Leg Skin T ear/Laceration Skin T ear Type II Diabetes 12/22/2022 6 Open No 0x0x0 0 0 100.00% 100.00% Full Thickness Without Exposed Support  Structures None Present N/A N/A Distinct, outline attached None Present (0%) N/A None Present (0%) N/A Fascia: No Fat Layer (Subcutaneous Tissue): No Tendon: No Muscle: No Joint: No Bone: No Large (67-100%) N/A N/A] [13:No Photos Left, Anterior Lower Leg  Skin T ear/Laceration Abrasion Type II Diabetes 12/22/2022 6 Open No 0x0x0 0 0 100.00% 100.00% Full Thickness Without Exposed Support Structures None Present N/A N/A Distinct, outline attached None Present (0%) N/A None Present (0%) N/A Fascia: No Fat  Layer (Subcutaneous Tissue): No Tendon: No Muscle: No Joint: No Bone: No Large (67-100%) N/A N/A] [name] [name] ([id number]) [11:N/A Tissue Debrided: N/A Level: N/A Debridement A (sq cm): rea N/A Instrument: N/A Bleeding: N/A Hemostasis A chieved: N/A Debridement Treatment Response: N/A Post Debridement Measurements L x W x D (cm) N/A Post Debridement Volume: (cm) Scarring:  Yes Periwound Skin Texture: No Abnormalities Noted Periwound Skin Moisture: No Abnormalities Noted Periwound Skin Color: No Abnormality  Temperature: N/A Procedures Performed:] [12:N/A N/A N/A N/A N/A N/A N/A N/A N/A Scarring: Yes No Abnormalities Noted  No Abnormalities Noted No Abnormality N/A] [id number] Page 3 of 20 N/A N/A N/A N/A N/A N/A N/A N/A N/A Scarring: Yes No Abnormalities Noted No Abnormalities Noted No Abnormality N/A] Wound Number: [id number]  Photos: Right Hand - Dorsum Left Upper Arm Left Abdomen - Upper Quadrant Wound Location: Skin T ear/Laceration Skin T ear/Laceration Skin T ear/Laceration Wounding Event: Skin T ear Skin T ear Abrasion Primary Etiology: Type II Diabetes Type II Diabetes Type II Diabetes Comorbid History: 01/27/2023 01/27/2023 01/27/2023 Date Acquired: 0 0 0 Weeks of Treatment: Open Open Open Wound Status: No No No Wound Recurrence: 2x0.7x0.1 4x6x0.1 4x3x0.1 Measurements L x W x D (cm) 1.1 18.85 9.425 A (cm) : rea 0.11 1.885 0.942 Volume (cm) : N/A N/A N/A % Reduction in A rea: N/A N/A N/A % Reduction in Volume: Full Thickness Without Exposed Full Thickness Without Exposed Full Thickness Without Exposed Classification: Support Structures Support Structures Support Structures Medium Medium Medium Exudate A mount: Serous Serosanguineous Serosanguineous Exudate Type: amber red, brown red, brown Exudate Color: Distinct, outline attached Distinct, outline attached Distinct, outline attached Wound Margin: Small (1-33%) Medium (34-66%) Large (67-100%) Granulation A mount: Red, Pink Pink Pink Granulation Quality: Large (67-100%) Medium (34-66%) Small (1-33%) Necrotic A mount: Adherent Slough Eschar, Adherent Slough Adherent Slough Necrotic Tissue: Fat Layer (Subcutaneous Tissue): Yes Fat Layer (Subcutaneous Tissue): Yes Fat Layer (Subcutaneous Tissue): Yes Exposed Structures: Fascia: No Fascia: No Fascia: No Tendon: No Tendon: No Tendon: No Muscle: No Muscle: No Muscle: No Joint: No Joint: No Joint: No Bone: No Bone: No Bone:  No Small (1-33%) Small (1-33%) Small (1-33%) Epithelialization: Debridement - Excisional Debridement - Excisional Debridement - Excisional Debridement: Pre-procedure Verification/Time Out 10:27 10:27 10:27 Taken: Lidocaine 4% Topical Solution Lidocaine 4% Topical Solution Lidocaine 4% Topical Solution Pain Control: Subcutaneous, Ambulance person, Subcutaneous, Subcutaneous, Slough Tissue Debrided: Slough Skin/Subcutaneous Tissue Skin/Subcutaneous Tissue Skin/Subcutaneous Tissue Level: 1.1 15.07 9.42 Debridement A (sq cm): rea Curette Curette Curette Instrument: Minimum Minimum Minimum Bleeding: Pressure Pressure Pressure Hemostasis Achieved: Procedure was tolerated well Procedure was tolerated well Procedure was tolerated well Debridement Treatment Response: 2x0.7x0.1 4x6x0.1 4x3x0.1 Post Debridement Measurements L x W x D (cm) 0.11 1.885 0.942 Post Debridement Volume: (cm) No Abnormalities Noted Scarring:  Yes Scarring: Yes Periwound Skin Texture: Rash: No No Abnormalities Noted No Abnormalities Noted No Abnormalities Noted Periwound Skin Moisture: No Abnormalities Noted No Abnormalities Noted No Abnormalities Noted Periwound Skin Color: No Abnormality No Abnormality No Abnormality Temperature: Debridement Debridement Debridement Procedures Performed: Wound Number: [id number]  Photos: No Photos [name], [name] ([id number]) [id number] Page 4 of 20 Left Abdomen - Lower Quadrant Right, Lateral Upper Leg Left Hand - Dorsum Wound Location: Skin Tear/Laceration Skin Tear/Laceration Skin Tear/Laceration Wounding Event: Abrasion Diabetic Wound/Ulcer of the Lower Abrasion Primary Etiology: Extremity Type II Diabetes Type II Diabetes Type II Diabetes Comorbid History: 01/27/2023 01/27/2023 09/08/2022 Date Acquired: 0 0 19 Weeks of Treatment: Open Open Open Wound Status: No No No Wound Recurrence: 0.6x0.4x0.1 4.4x3x0.1 0x0x0 Measurements  L x W x D (cm) 0.188 10.367 0 A (cm) : rea 0.019 1.037 0 Volume (cm) : N/A N/A 100.00% % Reduction in Area: N/A N/A 100.00% % Reduction in Volume: Full Thickness Without Exposed Grade 1 Full Thickness Without Exposed Classification: Support Structures Support Structures Medium Medium None Present Exudate A mount: Serosanguineous Serosanguineous N/A Exudate Type: red, brown red, brown N/A Exudate Color: Distinct, outline attached Distinct, outline attached Distinct, outline attached Wound Margin: Medium (34-66%) Medium (34-66%) None Present (0%) Granulation A mount: Red, Pink Red, Pink N/A Granulation Quality: Medium (34-66%) Medium (34-66%) None Present (0%) Necrotic A mount: Adherent Slough Adherent Slough N/A Necrotic Tissue: Fat Layer (Subcutaneous Tissue): Yes Fat Layer (Subcutaneous Tissue): Yes Fascia: No Exposed Structures: Fascia: No Fascia: No Fat Layer (Subcutaneous Tissue): No Tendon: No Tendon: No Tendon: No Muscle: No Muscle: No Muscle: No Joint: No Joint: No Joint: No Bone: No Bone: No Bone: No Small (1-33%) Small (1-33%) Large (67-100%) Epithelialization: Debridement - Excisional Debridement - Excisional N/A Debridement: Pre-procedure Verification/Time Out 10:27 10:27 N/A Taken: Lidocaine 4% Topical Solution Lidocaine 4% Topical Solution N/A Pain Control: Subcutaneous, Slough Necrotic/Eschar, Subcutaneous, N/A Tissue Debrided: Slough Skin/Subcutaneous Tissue Skin/Subcutaneous Tissue N/A Level: 0.19 10.36 N/A Debridement A (sq cm): rea Curette Curette N/A Instrument: Minimum Minimum N/A Bleeding: Pressure Pressure N/A Hemostasis A chieved: Procedure was tolerated well Procedure was tolerated well N/A Debridement Treatment Response: 0.6x0.4x0.1 4.4x3x0.1 N/A Post Debridement Measurements L x W x D (cm) 0.019 1.037 N/A Post Debridement Volume: (cm) Scarring: Yes Scarring: Yes Scarring: Yes Periwound Skin Texture: No  Abnormalities Noted No Abnormalities Noted No Abnormalities Noted Periwound Skin Moisture: No Abnormalities Noted No Abnormalities Noted No Abnormalities Noted Periwound Skin Color: No Abnormality No Abnormality No Abnormality Temperature: Debridement Debridement N/A Procedures Performed: Wound Number: [id number] N/A Photos: No Photos N/A Right, Anterior Lower Leg Right, Medial Lower Leg N/A Wound Location: Skin Tear/Laceration Skin Tear/Laceration N/A Wounding Event: Abrasion Abrasion N/A Primary Etiology: Type II Diabetes Type II Diabetes N/A Comorbid History: 09/25/2022 09/25/2022 N/A Date Acquired: 15 15 N/A Weeks of Treatment: Open Open N/A Wound Status: No No N/A Wound Recurrence: 0x0x0 0.5x0.5x0.1 N/A Measurements L x W x D (cm) 0 0.196 N/A A (cm) : rea 0 0.02 N/A Volume (cm) : 100.00% 91.70% N/A % Reduction in A rea: 100.00% 91.50% N/A % Reduction in Volume: Full Thickness Without Exposed Full Thickness Without Exposed N/A Classification: Support Structures Support Structures None Present Medium N/A Exudate Amount: N/A Serosanguineous N/A Exudate TypeTIMESHIA, SCHELLIN [name] ([id number]) [id number] Page 5 of 20 N/A red, brown N/A Exudate Color: Distinct, outline attached Distinct, outline attached N/A Wound Margin: None Present (0%) Medium (34-66%) N/A Granulation Amount: N/A Red N/A Granulation Quality: None Present (  0%) Medium (34-66%) N/A Necrotic Amount: N/A Eschar N/A Necrotic Tissue: Fascia: No Fat Layer (Subcutaneous Tissue): Yes N/A Exposed Structures: Fat Layer (Subcutaneous Tissue): No Fascia: No Tendon: No Tendon: No Muscle: No Muscle: No Joint: No Joint: No Bone: No Bone: No Large (67-100%) Medium (34-66%) N/A Epithelialization: N/A Debridement - Excisional N/A Debridement: Pre-procedure Verification/Time Out N/A 10:27 N/A Taken: N/A Lidocaine 4% Topical Solution N/A Pain Control: N/A Necrotic/Eschar,  Subcutaneous N/A Tissue Debrided: N/A Skin/Subcutaneous Tissue N/A Level: N/A 0.2 N/A Debridement A (sq cm): rea N/A Curette N/A Instrument: N/A Minimum N/A Bleeding: N/A Pressure N/A Hemostasis A chieved: N/A Procedure was tolerated well N/A Debridement Treatment Response: N/A 0.5x0.5x0.1 N/A Post Debridement Measurements L x W x D (cm) N/A 0.02 N/A Post Debridement Volume: (cm) Scarring: Yes Scarring: No N/A Periwound Skin Texture: No Abnormalities Noted No Abnormalities Noted N/A Periwound Skin Moisture: Rubor: Yes No Abnormalities Noted N/A Periwound Skin Color: No Abnormality No Abnormality N/A Temperature: N/A Debridement N/A Procedures Performed: Treatment Notes Electronic Signature(s) Signed: 02/05/2023 10:33:58 AM By: Duanne Guess MD FACS Entered By: Duanne Guess on 02/05/2023 07:33:58 -------------------------------------------------------------------------------- Multi-Disciplinary Care Plan Details Patient Name: Date of Service: DO [name], [name]. 02/05/2023 10:00 A M Medical Record Number: [id number] Patient Account Number: [id number] Date of Birth/Sex: Treating RN: [birth year] (22 y.o. [name] Primary Care Chancy Smigiel: Enid Skeens Other Clinician: Referring Keyari Kleeman: Treating Jahmia Berrett/Extender: Arther Dames in Treatment: 36 Active Inactive Necrotic Tissue Nursing Diagnoses: Impaired tissue integrity related to necrotic/devitalized tissue Knowledge deficit related to management of necrotic/devitalized tissue Goals: Necrotic/devitalized tissue will be minimized in the wound bed Date Initiated: 05/23/2022 Target Resolution Date: 01/26/2023 Goal Status: Active Patient/caregiver will verbalize understanding of reason and process for debridement of necrotic tissue Date Initiated: 05/23/2022 Target Resolution Date: 01/26/2023 Goal Status: Active Interventions: Assess patient pain level pre-, during and post  procedure and prior to discharge Provide education on necrotic tissue and debridement process KIMOTHY, CROKE ([id number]) [id number] Page 6 of 20 Treatment Activities: Apply topical anesthetic as ordered : 05/23/2022 Notes: Wound/Skin Impairment Nursing Diagnoses: Impaired tissue integrity Knowledge deficit related to ulceration/compromised skin integrity Goals: Patient/caregiver will verbalize understanding of skin care regimen Date Initiated: 05/23/2022 Target Resolution Date: 01/26/2023 Goal Status: Active Interventions: Assess patient/caregiver ability to perform ulcer/skin care regimen upon admission and as needed Assess ulceration(s) every visit Treatment Activities: Skin care regimen initiated : 05/23/2022 Topical wound management initiated : 05/23/2022 Notes: Electronic Signature(s) Signed: 02/05/2023 4:15:11 PM By: Samuella Bruin Entered By: Samuella Bruin on 02/05/2023 07:30:17 -------------------------------------------------------------------------------- Pain Assessment Details Patient Name: Date of Service: DO [name], [name]. 02/05/2023 10:00 A M Medical Record Number: [id number] Patient Account Number: [id number] Date of Birth/Sex: Treating RN: 01/24/2001 (22 y.o. [name] Primary Care Shannan Garfinkel: Enid Skeens Other Clinician: Referring Freemon Binford: Treating Neela Zecca/Extender: Arther Dames in Treatment: 36 Active Problems Location of Pain Severity and Description of Pain Patient Has Paino No Site Locations Rate the pain. Current Pain Level: 0 Pain Management and Medication Current Pain Management: Electronic Signature(s) [name], [name] ([id number]) [id number] Page 7 of 20 Signed: 02/05/2023 4:15:11 PM By: Gelene Mink By: Samuella Bruin on 02/05/2023  07:02:17 -------------------------------------------------------------------------------- Patient/Caregiver Education Details Patient Name: Date of Service: DO [name], [name]. 11/11/2024andnbsp10:00 A M Medical Record Number: [id number] Patient Account Number: [id number] Date of Birth/Gender: Treating RN: 07-17-2000 (22 y.o. [name] Primary Care Physician: Enid Skeens Other Clinician: Referring Physician: Treating  Physician/Extender: Arther Dames in Treatment: 36 Education Assessment Education Provided To: Caregiver Education Topics Provided Safety: Methods: Explain/Verbal Responses: Reinforcements needed, State content correctly [employer]) Signed: 02/05/2023 4:15:11 PM By: Samuella Bruin Entered By: Samuella Bruin on 02/05/2023 07:31:26 -------------------------------------------------------------------------------- Wound Assessment Details Patient Name: Date of Service: DO [name], [name]. 02/05/2023 10:00 A M Medical Record Number: [id number] Patient Account Number: [id number] Date of Birth/Sex: Treating RN: August 30, 2000 (22 y.o. [name] Primary Care Ashaunti Treptow: Enid Skeens Other Clinician: Referring Beverly Ferner: Treating Karris Deangelo/Extender: Arther Dames in Treatment: 36 Wound Status Wound Number: [id number] Primary Etiology: Abrasion Wound Location: Right Abdomen - Upper Quadrant Wound Status: Open Wounding Event: Trauma Comorbid History: Type II Diabetes Date Acquired: 11/26/2022 Weeks Of Treatment: 6 Clustered Wound: No Wound Measurements Length: (cm) Width: (cm) Depth: (cm) Area: (cm) Volume: (cm) 0 % Reduction in Area: 100% 0 % Reduction in Volume: 100% 0 Epithelialization: Large (67-100%) 0 Tunneling: No 0 Undermining: No Wound Description Classification: Full Thickness Without Exposed Support Structures Wound Margin: Distinct, outline attached Exudate Amount: None  Present [name], [name] ([id number]) Wound Bed Granulation Amount: None Present (0%) Necrotic Amount: None Present (0%) Foul Odor After Cleansing: No Slough/Fibrino No [id number] Page 8 of 20 Exposed Structure Fascia Exposed: No Fat Layer (Subcutaneous Tissue) Exposed: No Tendon Exposed: No Muscle Exposed: No Joint Exposed: No Bone Exposed: No Periwound Skin Texture Texture Color No Abnormalities Noted: No No Abnormalities Noted: Yes Scarring: Yes Temperature / Pain Temperature: No Abnormality Moisture No Abnormalities Noted: Yes Electronic Signature(s) Signed: 02/05/2023 4:15:11 PM By: Samuella Bruin Entered By: Samuella Bruin on 02/05/2023 07:10:09 -------------------------------------------------------------------------------- Wound Assessment Details Patient Name: Date of Service: DO [name], [name]. 02/05/2023 10:00 A M Medical Record Number: [id number] Patient Account Number: [id number] Date of Birth/Sex: Treating RN: 2000/12/13 (22 y.o. [name] Primary Care Anuhea Gassner: Enid Skeens Other Clinician: Referring Deidre Carino: Treating Minal Stuller/Extender: Arther Dames in Treatment: 36 Wound Status Wound Number: 12 Primary Etiology: Skin Tear Wound Location: Right, Lateral Lower Leg Wound Status: Open Wounding Event: Skin Tear/Laceration Comorbid History: Type II Diabetes Date Acquired: 12/22/2022 Weeks Of Treatment: 6 Clustered Wound: No Wound Measurements Length: (cm) Width: (cm) Depth: (cm) Area: (cm) Volume: (cm) 0 % Reduction in Area: 100% 0 % Reduction in Volume: 100% 0 Epithelialization: Large (67-100%) 0 Tunneling: No 0 Undermining: No Wound Description Classification: Full Thickness Without Exposed Suppor Wound Margin: Distinct, outline attached Exudate Amount: None Present t Structures Foul Odor After Cleansing: No Slough/Fibrino No Wound Bed Granulation Amount: None Present  (0%) Exposed Structure Necrotic Amount: None Present (0%) Fascia Exposed: No Fat Layer (Subcutaneous Tissue) Exposed: No Tendon Exposed: No Muscle Exposed: No Joint Exposed: No Bone Exposed: No Periwound Skin Texture Texture Color No Abnormalities Noted: No No Abnormalities Noted: Yes Scarring: Yes Temperature / Pain Temperature: No Abnormality Moisture No Abnormalities NotedAltheda Steiniger, Chia [name] ([id number]) [id number] Page 9 of 20 Electronic Signature(s) Signed: 02/05/2023 4:15:11 PM By: Samuella Bruin Entered By: Samuella Bruin on 02/05/2023 07:10:24 -------------------------------------------------------------------------------- Wound Assessment Details Patient Name: Date of Service: DO [name], [name]. 02/05/2023 10:00 A M Medical Record Number: [id number] Patient Account Number: [id number] Date of Birth/Sex: Treating RN: 08/05/2000 (22 y.o. [name] Primary Care Braelin Brosch: Enid Skeens Other Clinician: Referring Timber Lucarelli: Treating Mukhtar Shams/Extender: Arther Dames in Treatment: 36 Wound Status Wound Number: 13 Primary Etiology: Abrasion Wound Location: Left, Anterior Lower Leg Wound Status: Open  Wounding Event: Skin Tear/Laceration Comorbid History: Type II Diabetes Date Acquired: 12/22/2022 Weeks Of Treatment: 6 Clustered Wound: No Wound Measurements Length: (cm) Width: (cm) Depth: (cm) Area: (cm) Volume: (cm) 0 % Reduction in Area: 100% 0 % Reduction in Volume: 100% 0 Epithelialization: Large (67-100%) 0 Tunneling: No 0 Undermining: No Wound Description Classification: Full Thickness Without Exposed Suppor Wound Margin: Distinct, outline attached Exudate Amount: None Present t Structures Foul Odor After Cleansing: No Slough/Fibrino No Wound Bed Granulation Amount: None Present (0%) Exposed Structure Necrotic Amount: None Present (0%) Fascia Exposed: No Fat Layer (Subcutaneous  Tissue) Exposed: No Tendon Exposed: No Muscle Exposed: No Joint Exposed: No Bone Exposed: No Periwound Skin Texture Texture Color No Abnormalities Noted: No No Abnormalities Noted: Yes Scarring: Yes Temperature / Pain Temperature: No Abnormality Moisture No Abnormalities Noted: Yes Electronic Signature(s) Signed: 02/05/2023 4:15:11 PM By: Samuella Bruin Entered By: Samuella Bruin on 02/05/2023 07:10:36 Wound Assessment Details -------------------------------------------------------------------------------- [name] ([id number]) [id number] Page 10 of 20 Patient Name: Date of Service: DO [name], [name]. 02/05/2023 10:00 A M Medical Record Number: [id number] Patient Account Number: [id number] Date of Birth/Sex: Treating RN: 2000-05-08 (22 y.o. [name] Primary Care Hao Dion: Enid Skeens Other Clinician: Referring Martinez Boxx: Treating Divante Kotch/Extender: Arther Dames in Treatment: 36 Wound Status Wound Number: 14 Primary Etiology: Skin Tear Wound Location: Right Hand - Dorsum Wound Status: Open Wounding Event: Skin Tear/Laceration Comorbid History: Type II Diabetes Date Acquired: 01/27/2023 Weeks Of Treatment: 0 Clustered Wound: No Photos Wound Measurements Length: (cm) 2 % Reduction in Area: Width: (cm) 0.7 % Reduction in Volume: Depth: (cm) 0.1 Epithelialization: Small (1-33%) Area: (cm) 1.1 Tunneling: No Volume: (cm) 0.11 Undermining: No Wound Description Classification: Full Thickness Without Exposed Support Structures Foul Odor After Cleansing: No Wound Margin: Distinct, outline attached Slough/Fibrino Yes Exudate Amount: Medium Exudate Type: Serous Exudate Color: amber Wound Bed Granulation Amount: Small (1-33%) Exposed Structure Granulation Quality: Red, Pink Fascia Exposed: No Necrotic Amount: Large (67-100%) Fat Layer (Subcutaneous Tissue) Exposed: Yes Necrotic Quality:  Adherent Slough Tendon Exposed: No Muscle Exposed: No Joint Exposed: No Bone Exposed: No Periwound Skin Texture Texture Color No Abnormalities Noted: Yes No Abnormalities Noted: Yes Moisture Temperature / Pain No Abnormalities Noted: Yes Temperature: No Abnormality Treatment Notes Wound #14 (Hand - Dorsum) Wound Laterality: Right Cleanser Soap and Water Discharge Instruction: May shower and wash wound with dial antibacterial soap and water prior to dressing change. Wound Cleanser Discharge Instruction: Cleanse the wound with wound cleanser prior to applying a clean dressing using gauze sponges, not tissue or cotton balls. Peri-Wound Care Topical Mupirocin Ointment Discharge Instruction: Apply Mupirocin (Bactroban) as instructed [name], [name] [name] ([id number]) [id number] Page 11 of 20 Primary Dressing Maxorb Extra Ag+ Alginate Dressing, 2x2 (in/in) Discharge Instruction: Apply to wound bed as instructed Secondary Dressing T Non-Adherent Dressing, 3x4 in elfa Discharge Instruction: Apply over primary dressing as directed. Woven Gauze Sponge, Non-Sterile 4x4 in Discharge Instruction: Apply over primary dressing as directed. Secured With 49M Medipore H Soft Cloth Surgical T ape, 4 x 10 (in/yd) Discharge Instruction: Secure with tape as directed. Compression Wrap Compression Stockings Add-Ons Electronic Signature(s) Signed: 02/05/2023 4:15:11 PM By: Samuella Bruin Entered By: Samuella Bruin on 02/05/2023 07:19:26 -------------------------------------------------------------------------------- Wound Assessment Details Patient Name: Date of Service: DO [name], [name]. 02/05/2023 10:00 A M Medical Record Number: [id number] Patient Account Number: [id number] Date of Birth/Sex: Treating RN: 2001-02-12 (22 y.o. [name] Primary Care Krissa Utke: Enid Skeens Other  Clinician: Referring Lucinda Spells: Treating Hanley Woerner/Extender: Arther Dames in Treatment: 36 Wound Status Wound Number: 15 Primary Etiology: Skin Tear Wound Location: Left Upper Arm Wound Status: Open Wounding Event: Skin Tear/Laceration Comorbid History: Type II Diabetes Date Acquired: 01/27/2023 Weeks Of Treatment: 0 Clustered Wound: No Photos Wound Measurements Length: (cm) 4 Width: (cm) 6 Depth: (cm) 0.1 Area: (cm) 18.85 Volume: (cm) 1.885 % Reduction in Area: % Reduction in Volume: Epithelialization: Small (1-33%) Tunneling: No Undermining: No Wound Description Classification: Full Thickness Without Exposed Support Structures Wound Margin: Distinct, outline attached [name], [name] ([id number]) Exudate Amount: Medium Exudate Type: Serosanguineous Exudate Color: red, brown Foul Odor After Cleansing: No Slough/Fibrino Yes [id number] Page 12 of 20 Wound Bed Granulation Amount: Medium (34-66%) Exposed Structure Granulation Quality: Pink Fascia Exposed: No Necrotic Amount: Medium (34-66%) Fat Layer (Subcutaneous Tissue) Exposed: Yes Necrotic Quality: Eschar, Adherent Slough Tendon Exposed: No Muscle Exposed: No Joint Exposed: No Bone Exposed: No Periwound Skin Texture Texture Color No Abnormalities Noted: No No Abnormalities Noted: Yes Scarring: Yes Temperature / Pain Temperature: No Abnormality Moisture No Abnormalities Noted: Yes Treatment Notes Wound #15 (Upper Arm) Wound Laterality: Left Cleanser Soap and Water Discharge Instruction: May shower and wash wound with dial antibacterial soap and water prior to dressing change. Wound Cleanser Discharge Instruction: Cleanse the wound with wound cleanser prior to applying a clean dressing using gauze sponges, not tissue or cotton balls. Peri-Wound Care Topical Mupirocin Ointment Discharge Instruction: Apply Mupirocin (Bactroban) as instructed Primary Dressing Maxorb Extra Ag+ Alginate Dressing, 2x2 (in/in) Discharge  Instruction: Apply to wound bed as instructed Secondary Dressing T Non-Adherent Dressing, 3x4 in elfa Discharge Instruction: Apply over primary dressing as directed. Woven Gauze Sponge, Non-Sterile 4x4 in Discharge Instruction: Apply over primary dressing as directed. Secured With 17M Medipore H Soft Cloth Surgical T ape, 4 x 10 (in/yd) Discharge Instruction: Secure with tape as directed. Compression Wrap Compression Stockings Add-Ons Electronic Signature(s) Signed: 02/05/2023 4:15:11 PM By: Samuella Bruin Entered By: Samuella Bruin on 02/05/2023 07:19:48 -------------------------------------------------------------------------------- Wound Assessment Details Patient Name: Date of Service: DO [name], [name]. 02/05/2023 10:00 A M Medical Record Number: [id number] Patient Account Number: [id number] Date of Birth/Sex: Treating RN: [birth year] (22 y.o. [name] Primary Care Toniya Rozar: Enid Skeens Other Clinician: ADDALIE, FURNACE ([id number]) [id number] Page 13 of 20 Referring Evelynne Spiers: Treating Yazmyne Sara/Extender: Arther Dames in Treatment: 36 Wound Status Wound Number: 16 Primary Etiology: Abrasion Wound Location: Left Abdomen - Upper Quadrant Wound Status: Open Wounding Event: Skin Tear/Laceration Comorbid History: Type II Diabetes Date Acquired: 01/27/2023 Weeks Of Treatment: 0 Clustered Wound: No Photos Wound Measurements Length: (cm) 4 Width: (cm) 3 Depth: (cm) 0.1 Area: (cm) 9.425 Volume: (cm) 0.942 % Reduction in Area: % Reduction in Volume: Epithelialization: Small (1-33%) Tunneling: No Undermining: No Wound Description Classification: Full Thickness Without Exposed Support Structures Wound Margin: Distinct, outline attached Exudate Amount: Medium Exudate Type: Serosanguineous Exudate Color: red, brown Foul Odor After Cleansing: No Slough/Fibrino Yes Wound Bed Granulation Amount:  Large (67-100%) Exposed Structure Granulation Quality: Pink Fascia Exposed: No Necrotic Amount: Small (1-33%) Fat Layer (Subcutaneous Tissue) Exposed: Yes Necrotic Quality: Adherent Slough Tendon Exposed: No Muscle Exposed: No Joint Exposed: No Bone Exposed: No Periwound Skin Texture Texture Color No Abnormalities Noted: No No Abnormalities Noted: Yes Rash: No Temperature / Pain Scarring: Yes Temperature: No Abnormality Moisture No Abnormalities Noted: Yes Treatment Notes Wound #16 (Abdomen - Upper Quadrant) Wound Laterality: Left Cleanser Soap and Water Discharge Instruction:  May shower and wash wound with dial antibacterial soap and water prior to dressing change. Wound Cleanser Discharge Instruction: Cleanse the wound with wound cleanser prior to applying a clean dressing using gauze sponges, not tissue or cotton balls. Peri-Wound Care Topical Mupirocin Ointment Discharge Instruction: Apply Mupirocin (Bactroban) as instructed Primary Dressing [name], [name] ([id number]) [id number] Page 14 of 20 Maxorb Extra Ag+ Alginate Dressing, 2x2 (in/in) Discharge Instruction: Apply to wound bed as instructed Secondary Dressing T Non-Adherent Dressing, 3x4 in elfa Discharge Instruction: Apply over primary dressing as directed. Woven Gauze Sponge, Non-Sterile 4x4 in Discharge Instruction: Apply over primary dressing as directed. Secured With 59M Medipore H Soft Cloth Surgical T ape, 4 x 10 (in/yd) Discharge Instruction: Secure with tape as directed. Compression Wrap Compression Stockings Add-Ons Electronic Signature(s) Signed: 02/05/2023 4:15:11 PM By: Samuella Bruin Entered By: Samuella Bruin on 02/05/2023 07:20:05 -------------------------------------------------------------------------------- Wound Assessment Details Patient Name: Date of Service: DO [name], [name]. 02/05/2023 10:00 A M Medical Record Number: [id number] Patient  Account Number: [id number] Date of Birth/Sex: Treating RN: Aug 18, 2000 (23 y.o. [name] Primary Care Casimiro Lienhard: Enid Skeens Other Clinician: Referring Jenkins Risdon: Treating Reford Olliff/Extender: Arther Dames in Treatment: 36 Wound Status Wound Number: 17 Primary Etiology: Abrasion Wound Location: Left Abdomen - Lower Quadrant Wound Status: Open Wounding Event: Skin Tear/Laceration Comorbid History: Type II Diabetes Date Acquired: 01/27/2023 Weeks Of Treatment: 0 Clustered Wound: No Photos Wound Measurements Length: (cm) 0.6 Width: (cm) 0.4 Depth: (cm) 0.1 Area: (cm) 0.188 Volume: (cm) 0.019 % Reduction in Area: % Reduction in Volume: Epithelialization: Small (1-33%) Tunneling: No Undermining: No Wound Description Classification: Full Thickness Without Exposed Support Structures Wound Margin: Distinct, outline attached Exudate Amount: Medium Exudate Type: Serosanguineous [name], [name] ([id number]) Exudate Color: red, brown Foul Odor After Cleansing: No Slough/Fibrino Yes [id number].pdf Page 15 of 20 Wound Bed Granulation Amount: Medium (34-66%) Exposed Structure Granulation Quality: Red, Pink Fascia Exposed: No Necrotic Amount: Medium (34-66%) Fat Layer (Subcutaneous Tissue) Exposed: Yes Necrotic Quality: Adherent Slough Tendon Exposed: No Muscle Exposed: No Joint Exposed: No Bone Exposed: No Periwound Skin Texture Texture Color No Abnormalities Noted: No No Abnormalities Noted: Yes Scarring: Yes Temperature / Pain Temperature: No Abnormality Moisture No Abnormalities Noted: Yes Treatment Notes Wound #17 (Abdomen - Lower Quadrant) Wound Laterality: Left Cleanser Soap and Water Discharge Instruction: May shower and wash wound with dial antibacterial soap and water prior to dressing change. Wound Cleanser Discharge Instruction: Cleanse the wound with wound cleanser prior to applying a clean dressing  using gauze sponges, not tissue or cotton balls. Peri-Wound Care Topical Mupirocin Ointment Discharge Instruction: Apply Mupirocin (Bactroban) as instructed Primary Dressing Maxorb Extra Ag+ Alginate Dressing, 2x2 (in/in) Discharge Instruction: Apply to wound bed as instructed Secondary Dressing T Non-Adherent Dressing, 3x4 in elfa Discharge Instruction: Apply over primary dressing as directed. Woven Gauze Sponge, Non-Sterile 4x4 in Discharge Instruction: Apply over primary dressing as directed. Secured With 59M Medipore H Soft Cloth Surgical T ape, 4 x 10 (in/yd) Discharge Instruction: Secure with tape as directed. Compression Wrap Compression Stockings Add-Ons Electronic Signature(s) Signed: 02/05/2023 4:15:11 PM By: Samuella Bruin Entered By: Samuella Bruin on 02/05/2023 07:20:22 -------------------------------------------------------------------------------- Wound Assessment Details Patient Name: Date of Service: DO [name], [name]. 02/05/2023 10:00 A M Medical Record Number: [id number] Patient Account Number: [id number] Date of Birth/Sex: Treating RN: Jul 16, 2000 (22 y.o. [name] Primary Care Marvin Maenza: Enid Skeens Other Clinician: Referring Ellisa Devivo: Treating Albi Rappaport/Extender: Arther Dames in Treatment:  [address] Lashana [name] [id number]) [id number] Page 16 of 20 Wound Status Wound Number: 18 Primary Etiology: Diabetic Wound/Ulcer of the Lower Extremity Wound Location: Right, Lateral Upper Leg Wound Status: Open Wounding Event: Skin Tear/Laceration Comorbid History: Type II Diabetes Date Acquired: 01/27/2023 Weeks Of Treatment: 0 Clustered Wound: No Photos Wound Measurements Length: (cm) 4.4 Width: (cm) 3 Depth: (cm) 0.1 Area: (cm) 10.367 Volume: (cm) 1.037 % Reduction in Area: % Reduction in Volume: Epithelialization: Small (1-33%) Tunneling: No Undermining: No Wound  Description Classification: Grade 1 Wound Margin: Distinct, outline attached Exudate Amount: Medium Exudate Type: Serosanguineous Exudate Color: red, brown Foul Odor After Cleansing: No Slough/Fibrino Yes Wound Bed Granulation Amount: Medium (34-66%) Exposed Structure Granulation Quality: Red, Pink Fascia Exposed: No Necrotic Amount: Medium (34-66%) Fat Layer (Subcutaneous Tissue) Exposed: Yes Necrotic Quality: Adherent Slough Tendon Exposed: No Muscle Exposed: No Joint Exposed: No Bone Exposed: No Periwound Skin Texture Texture Color No Abnormalities Noted: No No Abnormalities Noted: Yes Scarring: Yes Temperature / Pain Temperature: No Abnormality Moisture No Abnormalities Noted: Yes Treatment Notes Wound #18 (Upper Leg) Wound Laterality: Right, Lateral Cleanser Soap and Water Discharge Instruction: May shower and wash wound with dial antibacterial soap and water prior to dressing change. Wound Cleanser Discharge Instruction: Cleanse the wound with wound cleanser prior to applying a clean dressing using gauze sponges, not tissue or cotton balls. Peri-Wound Care Topical Mupirocin Ointment Discharge Instruction: Apply Mupirocin (Bactroban) as instructed Primary Dressing Maxorb Extra Ag+ Alginate Dressing, 2x2 (in/in) Discharge Instruction: Apply to wound bed as instructed [name], STEHL ([id number]) [id number] Page 17 of 20 Secondary Dressing T Non-Adherent Dressing, 3x4 in elfa Discharge Instruction: Apply over primary dressing as directed. Woven Gauze Sponge, Non-Sterile 4x4 in Discharge Instruction: Apply over primary dressing as directed. Secured With 3M Medipore H Soft Cloth Surgical T ape, 4 x 10 (in/yd) Discharge Instruction: Secure with tape as directed. Compression Wrap Compression Stockings Add-Ons Electronic Signature(s) Signed: 02/05/2023 4:15:11 PM By: Samuella Bruin Entered By: Samuella Bruin on 02/05/2023  07:20:42 -------------------------------------------------------------------------------- Wound Assessment Details Patient Name: Date of Service: DO [name], [name]. 02/05/2023 10:00 A M Medical Record Number: [id number] Patient Account Number: [id number] Date of Birth/Sex: Treating RN: Jul 02, 2000 (22 y.o. [name] Primary Care Chanese Hartsough: Enid Skeens Other Clinician: Referring Jacquel Mccamish: Treating Karrisa Didio/Extender: Arther Dames in Treatment: 36 Wound Status Wound Number: 3 Primary Etiology: Abrasion Wound Location: Left Hand - Dorsum Wound Status: Open Wounding Event: Skin Tear/Laceration Comorbid History: Type II Diabetes Date Acquired: 09/08/2022 Weeks Of Treatment: 19 Clustered Wound: No Wound Measurements Length: (cm) Width: (cm) Depth: (cm) Area: (cm) Volume: (cm) 0 % Reduction in Area: 100% 0 % Reduction in Volume: 100% 0 Epithelialization: Large (67-100%) 0 Tunneling: No 0 Undermining: No Wound Description Classification: Full Thickness Without Exposed Suppor Wound Margin: Distinct, outline attached Exudate Amount: None Present t Structures Foul Odor After Cleansing: No Slough/Fibrino No Wound Bed Granulation Amount: None Present (0%) Exposed Structure Necrotic Amount: None Present (0%) Fascia Exposed: No Fat Layer (Subcutaneous Tissue) Exposed: No Tendon Exposed: No Muscle Exposed: No Joint Exposed: No Bone Exposed: No Periwound Skin Texture Texture Color No Abnormalities Noted: No No Abnormalities Noted: Yes Scarring: Yes Temperature / Pain Temperature: No Abnormality Moisture No Abnormalities NotedAnnslee Mcfee, Maggie [name] ([id number]) [id number].pdf Page 18 of 20 Electronic Signature(s) Signed: 02/05/2023 4:15:11 PM By: Samuella Bruin Entered By: Samuella Bruin on 02/05/2023 07:10:52 -------------------------------------------------------------------------------- Wound Assessment  Details Patient Name: Date of Service: DO [name], Rebekah DISO  N [name]. 02/05/2023 10:00 A M Medical Record Number: [id number] Patient Account Number: [id number] Date of Birth/Sex: Treating RN: 02/21/2001 (22 y.o. [name] Primary Care Daleyssa Loiselle: Enid Skeens Other Clinician: Referring Vivan Vanderveer: Treating Alise Calais/Extender: Arther Dames in Treatment: 36 Wound Status Wound Number: 7 Primary Etiology: Abrasion Wound Location: Right, Anterior Lower Leg Wound Status: Open Wounding Event: Skin Tear/Laceration Comorbid History: Type II Diabetes Date Acquired: 09/25/2022 Weeks Of Treatment: 15 Clustered Wound: No Wound Measurements Length: (cm) Width: (cm) Depth: (cm) Area: (cm) Volume: (cm) 0 % Reduction in Area: 100% 0 % Reduction in Volume: 100% 0 Epithelialization: Large (67-100%) 0 Tunneling: No 0 Undermining: No Wound Description Classification: Full Thickness Without Exposed Suppor Wound Margin: Distinct, outline attached Exudate Amount: None Present t Structures Foul Odor After Cleansing: No Slough/Fibrino No Wound Bed Granulation Amount: None Present (0%) Exposed Structure Necrotic Amount: None Present (0%) Fascia Exposed: No Fat Layer (Subcutaneous Tissue) Exposed: No Tendon Exposed: No Muscle Exposed: No Joint Exposed: No Bone Exposed: No Periwound Skin Texture Texture Color No Abnormalities Noted: No No Abnormalities Noted: No Scarring: Yes Rubor: Yes Moisture Temperature / Pain No Abnormalities Noted: Yes Temperature: No Abnormality Electronic Signature(s) Signed: 02/05/2023 4:15:11 PM By: Samuella Bruin Entered By: Samuella Bruin on 02/05/2023 07:11:06 -------------------------------------------------------------------------------- Wound Assessment Details Patient Name: Date of Service: DO [name], [name]. 02/05/2023 10:00 A M Medical Record Number: [id number] Patient Account Number: [id number] [name], [name]  ([id number]) [id number] Page 19 of 20 Date of Birth/Sex: Treating RN: 01/21/2001 (22 y.o. [name] Primary Care Hernando Reali: Enid Skeens Other Clinician: Referring Lisha Vitale: Treating Tavaughn Silguero/Extender: Arther Dames in Treatment: 36 Wound Status Wound Number: 8 Primary Etiology: Abrasion Wound Location: Right, Medial Lower Leg Wound Status: Open Wounding Event: Skin Tear/Laceration Comorbid History: Type II Diabetes Date Acquired: 09/25/2022 Weeks Of Treatment: 15 Clustered Wound: No Photos Wound Measurements Length: (cm) 0.5 Width: (cm) 0.5 Depth: (cm) 0.1 Area: (cm) 0.196 Volume: (cm) 0.02 % Reduction in Area: 91.7% % Reduction in Volume: 91.5% Epithelialization: Medium (34-66%) Tunneling: No Undermining: No Wound Description Classification: Full Thickness Without Exposed Support Structures Wound Margin: Distinct, outline attached Exudate Amount: Medium Exudate Type: Serosanguineous Exudate Color: red, brown Foul Odor After Cleansing: No Slough/Fibrino No Wound Bed Granulation Amount: Medium (34-66%) Exposed Structure Granulation Quality: Red Fascia Exposed: No Necrotic Amount: Medium (34-66%) Fat Layer (Subcutaneous Tissue) Exposed: Yes Necrotic Quality: Eschar Tendon Exposed: No Muscle Exposed: No Joint Exposed: No Bone Exposed: No Periwound Skin Texture Texture Color No Abnormalities Noted: No No Abnormalities Noted: Yes Scarring: No Temperature / Pain Temperature: No Abnormality Moisture No Abnormalities Noted: Yes Treatment Notes Wound #8 (Lower Leg) Wound Laterality: Right, Medial Cleanser Soap and Water Discharge Instruction: May shower and wash wound with dial antibacterial soap and water prior to dressing change. Wound Cleanser Discharge Instruction: Cleanse the wound with wound cleanser prior to applying a clean dressing using gauze sponges, not tissue or cotton balls. Peri-Wound  Care Topical Mupirocin Ointment Discharge Instruction: Apply Mupirocin (Bactroban) as instructed Shelana, [place] Ameia [name] ([id number]) [id number] Page 20 of 20 Primary Dressing Maxorb Extra Ag+ Alginate Dressing, 2x2 (in/in) Discharge Instruction: Apply to wound bed as instructed Secondary Dressing T Non-Adherent Dressing, 3x4 in elfa Discharge Instruction: Apply over primary dressing as directed. Woven Gauze Sponge, Non-Sterile 4x4 in Discharge Instruction: Apply over primary dressing as directed. Secured With 63M Medipore H Soft Cloth Surgical T ape, 4 x 10 (in/yd) Discharge Instruction: Secure with tape as directed. Compression  Wrap Compression Stockings Add-Ons Electronic Signature(s) Signed: 02/05/2023 4:15:11 PM By: Samuella Bruin Entered By: Samuella Bruin on 02/05/2023 07:20:59 -------------------------------------------------------------------------------- Vitals Details Patient Name: Date of Service: DO [name], [name]. 02/05/2023 10:00 A M Medical Record Number: [id number] Patient Account Number: [id number] Date of Birth/Sex: Treating RN: 2000-10-03 (22 y.o. [name] Primary Care Jafet Wissing: Enid Skeens Other Clinician: Referring Alyha Marines: Treating Alonah Lineback/Extender: Arther Dames in Treatment: 36 Vital Signs Time Taken: 10:02 Temperature (F): 97.7 Height (in): 60 Pulse (bpm): 82 Weight (lbs): 380 Respiratory Rate (breaths/min): 18 Body Mass Index (BMI): 74.2 Blood Pressure (mmHg): 163/76 Reference Range: 80 - 120 mg / dl Electronic Signature(s) Signed: 02/05/2023 4:15:11 PM By: Samuella Bruin Entered By: Samuella Bruin on 02/05/2023 07:05:41

## 2023-02-09 ENCOUNTER — Encounter: Payer: Self-pay | Admitting: Nurse Practitioner

## 2023-02-09 ENCOUNTER — Other Ambulatory Visit: Payer: Self-pay | Admitting: Nurse Practitioner

## 2023-02-09 DIAGNOSIS — E1165 Type 2 diabetes mellitus with hyperglycemia: Secondary | ICD-10-CM

## 2023-02-09 DIAGNOSIS — Z6841 Body Mass Index (BMI) 40.0 and over, adult: Secondary | ICD-10-CM

## 2023-02-09 DIAGNOSIS — R030 Elevated blood-pressure reading, without diagnosis of hypertension: Secondary | ICD-10-CM

## 2023-02-09 DIAGNOSIS — N3946 Mixed incontinence: Secondary | ICD-10-CM

## 2023-02-09 MED ORDER — TIRZEPATIDE 5 MG/0.5ML ~~LOC~~ SOAJ: 5.0000 mg | SUBCUTANEOUS | 1 refills | Status: DC

## 2023-02-09 MED ORDER — METFORMIN HCL 1000 MG PO TABS: 1000.0000 mg | ORAL_TABLET | Freq: Every day | ORAL | 1 refills | Status: AC

## 2023-02-12 ENCOUNTER — Encounter: Payer: Self-pay | Admitting: Nurse Practitioner

## 2023-02-13 ENCOUNTER — Other Ambulatory Visit: Payer: Self-pay | Admitting: Obstetrics and Gynecology

## 2023-02-13 DIAGNOSIS — N910 Primary amenorrhea: Secondary | ICD-10-CM

## 2023-02-14 ENCOUNTER — Other Ambulatory Visit (HOSPITAL_BASED_OUTPATIENT_CLINIC_OR_DEPARTMENT_OTHER): Payer: Self-pay

## 2023-02-16 ENCOUNTER — Ambulatory Visit (INDEPENDENT_AMBULATORY_CARE_PROVIDER_SITE_OTHER): Payer: 59 | Admitting: Obstetrics and Gynecology

## 2023-02-16 ENCOUNTER — Encounter: Payer: Self-pay | Admitting: Obstetrics and Gynecology

## 2023-02-16 ENCOUNTER — Other Ambulatory Visit (HOSPITAL_COMMUNITY)
Admission: RE | Admit: 2023-02-16 | Discharge: 2023-02-16 | Disposition: A | Discharge status: Home / Self Care | Payer: 59 | Source: Ambulatory Visit | Attending: Obstetrics and Gynecology | Admitting: Obstetrics and Gynecology

## 2023-02-16 VITALS — BP 122/76 | HR 92 | Ht 60.0 in | Wt 376.0 lb

## 2023-02-16 DIAGNOSIS — N9489 Other specified conditions associated with female genital organs and menstrual cycle: Secondary | ICD-10-CM | POA: Diagnosis not present

## 2023-02-16 DIAGNOSIS — Z01419 Encounter for gynecological examination (general) (routine) without abnormal findings: Secondary | ICD-10-CM | POA: Diagnosis not present

## 2023-02-16 DIAGNOSIS — N910 Primary amenorrhea: Secondary | ICD-10-CM | POA: Diagnosis not present

## 2023-02-16 DIAGNOSIS — Z124 Encounter for screening for malignant neoplasm of cervix: Secondary | ICD-10-CM

## 2023-02-16 MED ORDER — MEDROXYPROGESTERONE ACETATE 150 MG/ML IM SUSP
150.0000 mg | Freq: Once | INTRAMUSCULAR | Status: AC
  Administered 2023-02-16: 150 mg via INTRAMUSCULAR

## 2023-02-16 NOTE — Telephone Encounter (Signed)
Spoke with dad and he was agreeable to endocrinology

## 2023-02-16 NOTE — Assessment & Plan Note (Addendum)
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 Pamphlets for Mirena provided as they were also interested in pursuing this

## 2023-02-16 NOTE — Patient Instructions (Signed)

## 2023-02-16 NOTE — Assessment & Plan Note (Signed)
Cervical cancer screening performed according to [facility] guidelines. Not sexually active Labs and vaccines per PCP

## 2023-02-16 NOTE — Telephone Encounter (Signed)
Left message for pt's father Zella Ball to call me back

## 2023-02-16 NOTE — Progress Notes (Signed)
22 y.o. G0P0000 female with Prader- Willi syndrome, intellectual disability, primary amenorrhea, morbid obesity, poorly controlled T2DM, walker dependent here for annual exam. Lives with her parents and siblings.   Reviewed chart: has seen OBGYN at [facility] with recommendations for withdrawal bleeding. However, dad is unsure about work-up or discussion. He primarily takes care of daughter at home.  Patient's last menstrual period was 02/12/2023 (exact date). Period Duration (Days): 7 Period Pattern: (!) Irregular Menstrual Flow: Heavy Menstrual Control: Maxi pad Dysmenorrhea: None  +withdrawal bleed after provera  Pelvic discharge or pain: none Breast mass, nipple discharge or skin changes : none Birth control: none Last PAP: never Gardasil: completed Sexually active: never per dad  Exercising: no  GYN HISTORY: Primary amenorrhea  OB History  Gravida Para Term Preterm AB Living  0 0 0 0 0 0  SAB IAB Ectopic Multiple Live Births  0 0 0 0 0    Past Medical History:  Diagnosis Date   Body mass index (BMI) of 70 or greater in adult [facility]) 07/20/2020   DM (diabetes mellitus), type 2, uncontrolled with complications    Open wound of skin 07/20/2020   Pain in joint involving pelvic region and thigh, bilateral 07/20/2020   Prader-Willi syndrome    Scoliosis    Tibia vara of left lower extremity     Past Surgical History:  Procedure Laterality Date   KNEE SURGERY     MYRINGOTOMY Bilateral     Current Outpatient Medications on File Prior to Visit  Medication Sig Dispense Refill   Accu-Chek FastClix Lancets MISC SMARTSIG:Topical 6 Times Daily     ACCU-CHEK GUIDE test strip USE TO CHECK BLOOD SUGAR UP TO 6 TIMES DAILY AS DIRECTED 100 strip 1   AMBULATORY NON FORMULARY MEDICATION Rolling walker with seat and hand breaks for ambulatory assistance based on insurance coverage and patient fit. 1 Units 0   atorvastatin (LIPITOR) 20 MG tablet Take 1 tablet (20 mg total) by mouth at  bedtime. 90 tablet 3   B-D UF III MINI PEN NEEDLES 31G X 5 MM MISC SMARTSIG:Injection 90 each 3   mometasone (ELOCON) 0.1 % cream Apply to areas of itching twice a day. (Patient taking differently: Apply 1 Application topically daily as needed (itching).) 45 g 11   mupirocin ointment (BACTROBAN) 2 % APPLY TOPICALLY TO THE AFFECTED AREA 2 TO 3 TIMES DAILY AS NEEDED 110 g 1   oxybutynin (DITROPAN-XL) 5 MG 24 hr tablet TAKE 1 TABLET(5 MG) BY MOUTH AT BEDTIME 90 tablet 1   PREVIDENT 5000 BOOSTER PLUS 1.1 % PSTE SMARTSIG:To Teeth 3 Times Daily     tirzepatide (MOUNJARO) 5 MG/0.5ML Pen Inject 5 mg into the skin once a week. 6 mL 1   traMADol (ULTRAM) 50 MG tablet Take 1-2 tablets (50-100 mg total) by mouth at bedtime as needed for severe pain (pain score 7-10). 30 tablet 0   traZODone (DESYREL) 100 MG tablet Take 1 tablet (100 mg total) by mouth at bedtime. For sleep. 30 tablet 11   Vitamin D, Ergocalciferol, (DRISDOL) 1.25 MG (50000 UNIT) CAPS capsule Take 1 capsule (50,000 Units total) by mouth every 7 (seven) days. 12 capsule 3   metFORMIN (GLUCOPHAGE) 1000 MG tablet Take 1 tablet (1,000 mg total) by mouth daily with breakfast. (Patient not taking: Reported on 02/16/2023) 90 tablet 1   No current facility-administered medications on file prior to visit.    Social History   Socioeconomic History   Marital status: Single  Spouse name: Not on file   Number of children: Not on file   Years of education: Not on file   Highest education level: Not on file  Occupational History   Not on file  Tobacco Use   Smoking status: Never   Smokeless tobacco: Never  Vaping Use   Vaping status: Never Used  Substance and Sexual Activity   Alcohol use: No   Drug use: Never   Sexual activity: Never  Other Topics Concern   Not on file  Social History Narrative   Not on file   Social Determinants of Health   Financial Resource Strain: Low Risk  (07/03/2022)   Overall Financial Resource Strain (CARDIA)     Difficulty of Paying Living Expenses: Not hard at all  Food Insecurity: No Food Insecurity (07/03/2022)   Hunger Vital Sign    Worried About Running Out of Food in the Last Year: Never true    Ran Out of Food in the Last Year: Never true  Transportation Needs: No Transportation Needs (07/03/2022)   PRAPARE - Administrator, Civil Service (Medical): No    Lack of Transportation (Non-Medical): No  Physical Activity: Inactive (07/03/2022)   Exercise Vital Sign    Days of Exercise per Week: 0 days    Minutes of Exercise per Session: 0 min  Stress: Stress Concern Present (07/03/2022)   [employer] of Occupational Health - Occupational Stress Questionnaire    Feeling of Stress : To some extent  Social Connections: Unknown (08/06/2021)   Received from [facility]   Social Network    Social Network: Not on file  Intimate Partner Violence: Unknown (06/29/2021)   Received from [facility]   HITS    Physically Hurt: Not on file    Insult or Talk Down To: Not on file    Threaten Physical Harm: Not on file    Scream or Curse: Not on file    Family History  Problem Relation Age of Onset   Hypertension Father    Heart disease Maternal Grandmother    Cancer Paternal Grandfather    Breast cancer Neg Hx    Ovarian cancer Neg Hx     Allergies  Allergen Reactions   Latex Rash      PE Today's Vitals   02/16/23 1013  BP: 122/76  Pulse: 92  SpO2: 97%  Weight: (!) 376 lb (170.6 kg)  Height: 5' (1.524 m)    Body mass index is 73.43 kg/m.  Physical Exam Vitals reviewed. Exam conducted with a chaperone present.  Constitutional:      General: She is not in acute distress.    Appearance: She is obese.     Comments: Using a walker for pain  HENT:     Head: Normocephalic and atraumatic.     Nose: Nose normal.  Eyes:     Extraocular Movements: Extraocular movements intact.     Conjunctiva/sclera: Conjunctivae normal.  Pulmonary:      Effort: Pulmonary effort is normal.  Genitourinary:    General: Normal vulva.     Urethra: No prolapse.     Vagina: Bleeding present.     Uterus: Not tender.      Adnexa:        Right: No tenderness.         Left: No tenderness.       Comments: Unable to visualize cervix, patient did not tolerate complete speculum placement, blind PAP collected Musculoskeletal:  Cervical back: Normal range of motion.  Lymphadenopathy:     Lower Body: No right inguinal adenopathy. No left inguinal adenopathy.  Neurological:     Mental Status: She is alert.     Gait: Gait abnormal.  Psychiatric:        Mood and Affect: Mood normal.      Assessment and Plan:        Well woman exam with routine gynecological exam Assessment & Plan: Cervical cancer screening performed according to ASCCP guidelines. Not sexually active Labs and vaccines per PCP    Cervical cancer screening -     Cytology - PAP  Primary amenorrhea Assessment & Plan: 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 Pamphlets for Mirena provided as they were also interested in pursuing this  Orders: -     medroxyPROGESTERone Acetate  Menstrual suppression -     medroxyPROGESTERone Acetate     Rosalyn Gess, MD

## 2023-02-17 ENCOUNTER — Other Ambulatory Visit: Payer: Self-pay | Admitting: Nurse Practitioner

## 2023-02-17 DIAGNOSIS — N910 Primary amenorrhea: Secondary | ICD-10-CM

## 2023-02-17 DIAGNOSIS — E538 Deficiency of other specified B group vitamins: Secondary | ICD-10-CM

## 2023-02-17 DIAGNOSIS — E1165 Type 2 diabetes mellitus with hyperglycemia: Secondary | ICD-10-CM

## 2023-02-17 DIAGNOSIS — Q8711 Prader-Willi syndrome: Secondary | ICD-10-CM

## 2023-02-17 DIAGNOSIS — F424 Excoriation (skin-picking) disorder: Secondary | ICD-10-CM

## 2023-02-17 DIAGNOSIS — E559 Vitamin D deficiency, unspecified: Secondary | ICD-10-CM

## 2023-02-17 DIAGNOSIS — R52 Pain, unspecified: Secondary | ICD-10-CM

## 2023-02-17 DIAGNOSIS — Z5189 Encounter for other specified aftercare: Secondary | ICD-10-CM

## 2023-02-17 DIAGNOSIS — Z6841 Body Mass Index (BMI) 40.0 and over, adult: Secondary | ICD-10-CM

## 2023-02-17 DIAGNOSIS — F5105 Insomnia due to other mental disorder: Secondary | ICD-10-CM

## 2023-02-17 DIAGNOSIS — E1169 Type 2 diabetes mellitus with other specified complication: Secondary | ICD-10-CM

## 2023-02-19 ENCOUNTER — Other Ambulatory Visit: Payer: Self-pay | Admitting: Nurse Practitioner

## 2023-02-19 NOTE — Telephone Encounter (Signed)
Last apt 01/23/23.

## 2023-02-21 ENCOUNTER — Other Ambulatory Visit: Payer: Self-pay

## 2023-02-21 ENCOUNTER — Encounter (HOSPITAL_COMMUNITY): Payer: Self-pay | Admitting: Family

## 2023-02-21 ENCOUNTER — Ambulatory Visit (HOSPITAL_BASED_OUTPATIENT_CLINIC_OR_DEPARTMENT_OTHER): Payer: 59 | Admitting: Family

## 2023-02-21 VITALS — BP 132/84 | HR 90 | Ht 60.0 in | Wt 333.0 lb

## 2023-02-21 DIAGNOSIS — Q8711 Prader-Willi syndrome: Secondary | ICD-10-CM

## 2023-02-21 DIAGNOSIS — Z7289 Other problems related to lifestyle: Secondary | ICD-10-CM

## 2023-02-21 LAB — CYTOLOGY - PAP: Diagnosis: NEGATIVE

## 2023-02-21 NOTE — Progress Notes (Signed)
Psychiatric Initial Adult Assessment   Patient Identification: [name] MRN:  [id number] Date of Evaluation:  02/21/2023 Referral Source: Rebekah Skeens NP  Chief Complaint:  [name] stated " I don't know."   Visit Diagnosis:    ICD-10-CM   1. Prader-Willi syndrome  Q87.11     2. Self-injurious behavior  Z72.89       History of Present Illness:  [name] 22 year old female presents to establish care.  Patient is accompanied by her father [name].  She provided verbal authorization for father to stay during this assessment.  Patient is very minimal and a poor historian.  History was provided by patient's father, [name] reports patient's diagnosed with Prader-Willi Syndrom.  Father reports concerns of worsening temper tantrums, compulsions related to skin picking and mood irritability.  Father reports often times her outbursts are due to inability to get her away.  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 States that he didn't note any benefit with  medications, as the medication wasn't helping her symptoms.  States they was referred to psychiatry at this time.   Patient father stated, that patient had a frequent outburst, reported patient went into the bathroom and started stabbing her arms and legs.Reported  she is currently followed by the wound treatment center due to poor healing.  [name] denies suicidal or homicidal ideations.  Denies auditory or visual hallucinations at this visit.  She presents pleasant, calm and cooperative throughout this assessment.   As documented by previous healthcare provider sertraline 200 mg was prescribed downward taper and then discontinued.  Reports patient have symptoms related to worsening depression, increased anxiety, mood swings, irritability energy  and overeating.  Reports patient current diagnoses with Padar Willi syndrome patient exhibits all  symptoms as he has documentation related to mild mental retardation. (Unknown IQ) severe skin picking, stealing food and moodiness.    Denied previous inpatient admissions.  Denied that patient has ever been followed by therapy or psychiatry in the past.  New self injures behaviors presented with within this past year.   During evaluation [name] is sitting will engage with conversation if directed; she is very minimal but pleasant.  She is alert/oriented x 4; calm/cooperative; and mood congruent with affect.  Patient observed using a rollator for ambulation assistance.  patient is speaking in a clear tone at moderate volume, and normal pace; with good eye contact. Her thought process is coherent and relevant; There is no indication that she is currently responding to internal/external stimuli or experiencing delusional thought content.  Patient denies suicidal/self-harm/homicidal ideation, psychosis, and paranoia.  Patient has remained calm throughout assessment and has answered questions appropriately.   Associated Signs/Symptoms: Depression Symptoms:  difficulty concentrating, anxiety, (Hypo) Manic Symptoms:  Distractibility, Anxiety Symptoms:  Excessive Worry, Psychotic Symptoms:  Hallucinations: None PTSD Symptoms: NA  Past Psychiatric History:   Previous Psychotropic Medications: Yes   Substance Abuse History in the last 12 months:  No.  Consequences of Substance Abuse: NA  Past Medical History:  Past Medical History:  Diagnosis Date   Body mass index (BMI) of 70 or greater in adult [facility]) 07/20/2020   DM (diabetes mellitus), type 2, uncontrolled with complications    Open wound of skin 07/20/2020   Pain in joint involving pelvic region and thigh, bilateral 07/20/2020   Prader-Willi syndrome    Scoliosis    Tibia vara of left lower extremity  Past Surgical History:  Procedure Laterality Date   KNEE SURGERY     MYRINGOTOMY Bilateral     Family Psychiatric  History:   Family History:  Family History  Problem Relation Age of Onset   Hypertension Father    Heart disease Maternal Grandmother    Cancer Paternal Grandfather    Breast cancer Neg Hx    Ovarian cancer Neg Hx     Social History:   Social History   Socioeconomic History   Marital status: Single    Spouse name: Not on file   Number of children: Not on file   Years of education: Not on file   Highest education level: Not on file  Occupational History   Not on file  Tobacco Use   Smoking status: Never   Smokeless tobacco: Never  Vaping Use   Vaping status: Never Used  Substance and Sexual Activity   Alcohol use: No   Drug use: Never   Sexual activity: Never  Other Topics Concern   Not on file  Social History Narrative   Not on file   Social Determinants of Health   Financial Resource Strain: Low Risk  (07/03/2022)   Overall Financial Resource Strain (CARDIA)    Difficulty of Paying Living Expenses: Not hard at all  Food Insecurity: No Food Insecurity (07/03/2022)   Hunger Vital Sign    Worried About Running Out of Food in the Last Year: Never true    Ran Out of Food in the Last Year: Never true  Transportation Needs: No Transportation Needs (07/03/2022)   PRAPARE - Administrator, Civil Service (Medical): No    Lack of Transportation (Non-Medical): No  Physical Activity: Inactive (07/03/2022)   Exercise Vital Sign    Days of Exercise per Week: 0 days    Minutes of Exercise per Session: 0 min  Stress: Stress Concern Present (07/03/2022)   [name] of Occupational Health - Occupational Stress Questionnaire    Feeling of Stress : To some extent  Social Connections: Unknown (08/06/2021)   Received from [facility], [facility]   Social Network    Social Network: Not on file    Additional Social History:   Allergies:   Allergies  Allergen Reactions   Latex Rash    Metabolic Disorder Labs: Lab Results  Component Value Date   HGBA1C 6.3  (H) 01/23/2023   MPG 191.51 07/20/2020   Lab Results  Component Value Date   PROLACTIN 5.4 01/11/2023   Lab Results  Component Value Date   CHOL 132 01/23/2023   TRIG 101 01/23/2023   HDL 42 01/23/2023   CHOLHDL 3.1 01/23/2023   VLDL 23 07/20/2020   LDLCALC 71 01/23/2023   LDLCALC 156 (H) 03/06/2022   Lab Results  Component Value Date   TSH 2.690 01/23/2023    Therapeutic Level Labs: No results found for: "LITHIUM" No results found for: "CBMZ" No results found for: "VALPROATE"  Current Medications: Current Outpatient Medications  Medication Sig Dispense Refill   Accu-Chek FastClix Lancets MISC SMARTSIG:Topical 6 Times Daily     ACCU-CHEK GUIDE TEST test strip USE TO CHECK BLOOD SUGAR UP TO 6 TIMES DAILY AS DIRECTED 100 strip 1   AMBULATORY NON FORMULARY MEDICATION Rolling walker with seat and hand breaks for ambulatory assistance based on insurance coverage and patient fit. 1 Units 0   atorvastatin (LIPITOR) 20 MG tablet Take 1 tablet (20 mg total) by mouth at bedtime. 90 tablet 3   oxybutynin (  DITROPAN-XL) 5 MG 24 hr tablet TAKE 1 TABLET(5 MG) BY MOUTH AT BEDTIME 90 tablet 1   PREVIDENT 5000 BOOSTER PLUS 1.1 % PSTE SMARTSIG:To Teeth 3 Times Daily     tirzepatide (MOUNJARO) 5 MG/0.5ML Pen Inject 5 mg into the skin once a week. 6 mL 1   traMADol (ULTRAM) 50 MG tablet Take 1-2 tablets (50-100 mg total) by mouth at bedtime as needed for severe pain (pain score 7-10). 30 tablet 0   traZODone (DESYREL) 100 MG tablet Take 1 tablet (100 mg total) by mouth at bedtime. For sleep. 30 tablet 11   Vitamin D, Ergocalciferol, (DRISDOL) 1.25 MG (50000 UNIT) CAPS capsule Take 1 capsule (50,000 Units total) by mouth every 7 (seven) days. 12 capsule 3   B-D UF III MINI PEN NEEDLES 31G X 5 MM MISC SMARTSIG:Injection 90 each 3   metFORMIN (GLUCOPHAGE) 1000 MG tablet Take 1 tablet (1,000 mg total) by mouth daily with breakfast. (Patient not taking: Reported on 02/21/2023) 90 tablet 1    mometasone (ELOCON) 0.1 % cream Apply to areas of itching twice a day. (Patient taking differently: Apply 1 Application topically daily as needed (itching).) 45 g 11   mupirocin ointment (BACTROBAN) 2 % APPLY TOPICALLY TO THE AFFECTED AREA 2 TO 3 TIMES DAILY AS NEEDED 110 g 1   No current facility-administered medications for this visit.    Musculoskeletal: Strength & Muscle Tone: atrophy Gait & Station: unsteady Patient leans:  using a walker for ambulation  Psychiatric Specialty Exam: Review of Systems  Psychiatric/Behavioral:  Positive for behavioral problems. The patient is nervous/anxious.   All other systems reviewed and are negative.   Blood pressure 132/84, pulse 90, height 5' (1.524 m), weight (!) 333 lb (151 kg), last menstrual period 02/12/2023.Body mass index is 65.03 kg/m.  General Appearance: Casual, obesity pleasant affect  Eye Contact:  Fair  Speech:  Clear and Coherent  Volume:  Normal  Mood:  Anxious and Depressed  Affect:  Congruent  Thought Process:  Coherent and Linear  Orientation:  Full (Time, Place, and Person)  Thought Content:  Logical  Suicidal Thoughts:  No  Homicidal Thoughts:  No  Memory:  Immediate;   Good Recent;   Good  Judgement:  Impaired  Insight:  Lacking  Psychomotor Activity:   ambulation assistance with rolator    Concentration:  Concentration: Poor  Recall:  NA  Fund of Knowledge:Fair  Language: Good  Akathisia:  No  Handed:  Right  AIMS (if indicated):  not done  Assets:  Communication Skills Desire for Improvement Resilience Social Support  ADL's:  Intact  Cognition: WNL  Sleep:  Good   Screenings: GAD-7    Flowsheet Row Office Visit from 07/20/2020 in [facility] at [facility]  Total GAD-7 Score 0      PHQ2-9    Flowsheet Row Office Visit from 02/21/2023 in [facility] Office Visit from 07/12/2021 in [facility] Primary [facility]  Medicine at [facility] Office Visit from 07/20/2020 in [facility] Medicine at [facility] Total Score 0 0 0  PHQ-9 Total Score 12 -- 0      Flowsheet Row ED from 05/12/2022 in [facility] Emergency Department at [facility]  C-SSRS RISK CATEGORY No Risk       Assessment and Plan: Moani Rael presents accompanied by her father to establish care.  Charted documentation related to diagnosis with  Prader Willi syndrome.  Reported patient was taking Zoloft 200 mg however, he continues to have concerns related to patient's behavior.  Discussed following up with intensive therapy services and will refer to neuropsychiatry for medication management.   Prader Johnnette Gourd syndrome:   Self injurious behaviors:  Patient deferred to Neuropsychiatry -Provided additional outpatient resources for psychologic testing/ IQ testing and diagnoses  Consideration for Intensive Behavioral Therapy services  Collaboration of Care: Psychiatrist AEB Mojeed Akintayo  Patient/Guardian was advised Release of Information must be obtained prior to any record release in order to collaborate their care with an outside provider. Patient/Guardian was advised if they have not already done so to contact the registration department to sign all necessary forms in order for [place] to release information regarding their care.   Consent: Patient/Guardian gives verbal consent for treatment and assignment of benefits for services provided during this visit. Patient/Guardian expressed understanding and agreed to proceed.   Oneta Rack, NP 11/27/20241:57 PM

## 2023-02-26 NOTE — Telephone Encounter (Signed)
Please handle the request

## 2023-02-28 ENCOUNTER — Encounter: Payer: Self-pay | Admitting: "Endocrinology

## 2023-02-28 ENCOUNTER — Ambulatory Visit (INDEPENDENT_AMBULATORY_CARE_PROVIDER_SITE_OTHER): Payer: 59 | Admitting: "Endocrinology

## 2023-02-28 VITALS — BP 124/80 | HR 125 | Ht 61.0 in | Wt 368.8 lb

## 2023-02-28 DIAGNOSIS — Z794 Long term (current) use of insulin: Secondary | ICD-10-CM

## 2023-02-28 DIAGNOSIS — Q8711 Prader-Willi syndrome: Secondary | ICD-10-CM

## 2023-02-28 DIAGNOSIS — E1165 Type 2 diabetes mellitus with hyperglycemia: Secondary | ICD-10-CM | POA: Diagnosis not present

## 2023-02-28 MED ORDER — PIOGLITAZONE HCL 15 MG PO TABS: 15.0000 mg | ORAL_TABLET | Freq: Every day | ORAL | 3 refills | Status: AC

## 2023-02-28 MED ORDER — DEXCOM G7 SENSOR MISC: 1.0000 | 3 refills | Status: AC

## 2023-02-28 NOTE — Progress Notes (Signed)
Outpatient Endocrinology Note Rebekah Albion, MD  02/28/23   [name] 03-Aug-2000 [id number]  Referring Provider: Tollie Eth, NP Primary Care Provider: Tollie Eth, NP Reason for consultation: Subjective   Assessment & Plan  Diagnoses and all orders for this visit:  Uncontrolled type 2 diabetes mellitus with hyperglycemia, with long-term current use of insulin (HCC) -     Ambulatory referral to diabetic education  Prader-Willi syndrome  Other orders -     Continuous Glucose Sensor (DEXCOM G7 SENSOR) MISC; 1 Device by Does not apply route continuous. -     pioglitazone (ACTOS) 15 MG tablet; Take 1 tablet (15 mg total) by mouth daily.    Diabetes Type II complicated by , No results found for: "GFR" Hba1c goal less than 7, current Hba1c is  Lab Results  Component Value Date   HGBA1C 6.3 (H) 01/23/2023   Will recommend the following: All history is obtained by father as patient is minimally verbal during this appointment   Toujeo 32 units every day Actos 15 mg every day DexCom G7 Ordered DM education  Patient has Prader-Willi syndrome (PWS) No history of heart attack/heart failure/pedal edema/urinary bladder cancer   No known contraindications/side effects to any of above medications Couldn't not tolerate mounjaro due to diarrhea and metformin due to bedwetting  -Last LD and Tg are as follows: Lab Results  Component Value Date   LDLCALC 71 01/23/2023    Lab Results  Component Value Date   TRIG 101 01/23/2023   -not on statin  -Follow low fat diet and exercise   -Blood pressure goal <140/90 - Microalbumin/creatinine goal is < 30 -Last MA/Cr is as follows: No results found for: "MICROALBUR", "MALB24HUR" -not on ACE/ARB  -diet changes including salt restriction -limit eating outside -counseled BP targets per standards of diabetes care -uncontrolled blood pressure can lead to retinopathy, nephropathy and cardiovascular and atherosclerotic  heart disease  Reviewed and counseled on: -A1C target -Blood sugar targets -Complications of uncontrolled diabetes  -Checking blood sugar before meals and bedtime and bring log next visit -All medications with mechanism of action and side effects -Hypoglycemia management: rule of 15's, Glucagon Emergency Kit and medical alert ID -low-carb low-fat plate-method diet -At least 20 minutes of physical activity per day -Annual dilated retinal eye exam and foot exam -compliance and follow up needs -follow up as scheduled or earlier if problem gets worse  Call if blood sugar is less than 70 or consistently above 250    Take a 15 gm snack of carbohydrate at bedtime before you go to sleep if your blood sugar is less than 100.    If you are going to fast after midnight for a test or procedure, ask your physician for instructions on how to reduce/decrease your insulin dose.    Call if blood sugar is less than 70 or consistently above 250  -Treating a low sugar by rule of 15  (15 gms of sugar every 15 min until sugar is more than 70) If you feel your sugar is low, test your sugar to be sure If your sugar is low (less than 70), then take 15 grams of a fast acting Carbohydrate (3-4 glucose tablets or glucose gel or 4 ounces of juice or regular soda) Recheck your sugar 15 min after treating low to make sure it is more than 70 If sugar is still less than 70, treat again with 15 grams of carbohydrate  Don't drive the hour of hypoglycemia  If unconscious/unable to eat or drink by mouth, use glucagon injection or nasal spray baqsimi and call 911. Can repeat again in 15 min if still unconscious.  Return in about 4 weeks (around 03/28/2023).   I have reviewed current medications, nurse's notes, allergies, vital signs, past medical and surgical history, family medical history, and social history for this encounter. Counseled patient on symptoms, examination findings, lab findings, imaging results,  treatment decisions and monitoring and prognosis. The patient understood the recommendations and agrees with the treatment plan. All questions regarding treatment plan were fully answered.  Rebekah [place], MD  02/28/23    History of Present Illness [name] is a 22 y.o. year old female who presents for evaluation of Type II diabetes mellitus.  ANYCIA Black was first diagnosed in 2018.   Diabetes education +  Home diabetes regimen: Toujeo 32 units every day  COMPLICATIONS -  MI/Stroke -  retinopathy +  neuropathy -  nephropathy  BLOOD SUGAR DATA 93-168 bid  Physical Exam  BP 124/80   Pulse (!) 125   Ht 5\' 1"  (1.549 m)   Wt (!) 368 lb 12.8 oz (167.3 kg)   LMP 02/12/2023 (Exact Date)   SpO2 99%   BMI 69.68 kg/m    Constitutional: well developed, well nourished Head: normocephalic, atraumatic Eyes: sclera anicteric, no redness Neck: supple Lungs: normal respiratory effort Neurology: alert and oriented Skin: dry, no appreciable rashes Musculoskeletal: no appreciable defects Psychiatric: normal mood and affect Diabetic Foot Exam - Simple   No data filed      Current Medications Patient's Medications  New Prescriptions   CONTINUOUS GLUCOSE SENSOR (DEXCOM G7 SENSOR) MISC    1 Device by Does not apply route continuous.   PIOGLITAZONE (ACTOS) 15 MG TABLET    Take 1 tablet (15 mg total) by mouth daily.  Previous Medications   ACCU-CHEK FASTCLIX LANCETS MISC    SMARTSIG:Topical 6 Times Daily   ACCU-CHEK GUIDE TEST TEST STRIP    USE TO CHECK BLOOD SUGAR UP TO 6 TIMES DAILY AS DIRECTED   AMBULATORY NON FORMULARY MEDICATION    Rolling walker with seat and hand breaks for ambulatory assistance based on insurance coverage and patient fit.   ATORVASTATIN (LIPITOR) 20 MG TABLET    Take 1 tablet (20 mg total) by mouth at bedtime.   B-D UF III MINI PEN NEEDLES 31G X 5 MM MISC    SMARTSIG:Injection   INSULIN GLARGINE (TOUJEO MAX SOLOSTAR [place])    Inject 32 Units into  the skin daily.   METFORMIN (GLUCOPHAGE) 1000 MG TABLET    Take 1 tablet (1,000 mg total) by mouth daily with breakfast.   MOMETASONE (ELOCON) 0.1 % CREAM    Apply to areas of itching twice a day.   MUPIROCIN OINTMENT (BACTROBAN) 2 %    APPLY TOPICALLY TO THE AFFECTED AREA 2 TO 3 TIMES DAILY AS NEEDED   OXYBUTYNIN (DITROPAN-XL) 5 MG 24 HR TABLET    TAKE 1 TABLET(5 MG) BY MOUTH AT BEDTIME   PREVIDENT 5000 BOOSTER PLUS 1.1 % PSTE    SMARTSIG:To Teeth 3 Times Daily   TIRZEPATIDE (MOUNJARO) 5 MG/0.5ML PEN    Inject 5 mg into the skin once a week.   TRAMADOL (ULTRAM) 50 MG TABLET    Take 1-2 tablets (50-100 mg total) by mouth at bedtime as needed for severe pain (pain score 7-10).   TRAZODONE (DESYREL) 100 MG TABLET    Take  1 tablet (100 mg total) by mouth at bedtime. For sleep.   VITAMIN D, ERGOCALCIFEROL, (DRISDOL) 1.25 MG (50000 UNIT) CAPS CAPSULE    Take 1 capsule (50,000 Units total) by mouth every 7 (seven) days.  Modified Medications   No medications on file  Discontinued Medications   No medications on file    Allergies Allergies  Allergen Reactions   Latex Rash    Past Medical History Past Medical History:  Diagnosis Date   Body mass index (BMI) of 70 or greater in adult [facility]) 07/20/2020   DM (diabetes mellitus), type 2, uncontrolled with complications    Open wound of skin 07/20/2020   Pain in joint involving pelvic region and thigh, bilateral 07/20/2020   Prader-Willi syndrome    Scoliosis    Tibia vara of left lower extremity     Past Surgical History Past Surgical History:  Procedure Laterality Date   KNEE SURGERY     MYRINGOTOMY Bilateral     Family History family history includes Cancer in her paternal grandfather; Heart disease in her maternal grandmother; Hypertension in her father.  Social History Social History   Socioeconomic History   Marital status: Single    Spouse name: Not on file   Number of children: Not on file   Years of education: Not on  file   Highest education level: Not on file  Occupational History   Not on file  Tobacco Use   Smoking status: Never   Smokeless tobacco: Never  Vaping Use   Vaping status: Never Used  Substance and Sexual Activity   Alcohol use: No   Drug use: Never   Sexual activity: Never  Other Topics Concern   Not on file  Social History Narrative   Not on file   Social Determinants of Health   Financial Resource Strain: Low Risk  (07/03/2022)   Overall Financial Resource Strain (CARDIA)    Difficulty of Paying Living Expenses: Not hard at all  Food Insecurity: No Food Insecurity (07/03/2022)   Hunger Vital Sign    Worried About Running Out of Food in the Last Year: Never true    Ran Out of Food in the Last Year: Never true  Transportation Needs: No Transportation Needs (07/03/2022)   PRAPARE - Administrator, Civil Service (Medical): No    Lack of Transportation (Non-Medical): No  Physical Activity: Inactive (07/03/2022)   Exercise Vital Sign    Days of Exercise per Week: 0 days    Minutes of Exercise per Session: 0 min  Stress: Stress Concern Present (07/03/2022)   [employer] of Occupational Health - Occupational Stress Questionnaire    Feeling of Stress : To some extent  Social Connections: Unknown (08/06/2021)   Received from [facility]   Social Network    Social Network: Not on file  Intimate Partner Violence: Unknown (06/29/2021)   Received from [facility], [facility]   HITS    Physically Hurt: Not on file    Insult or Talk Down To: Not on file    Threaten Physical Harm: Not on file    Scream or Curse: Not on file    Lab Results  Component Value Date   HGBA1C 6.3 (H) 01/23/2023   HGBA1C 6.2 (H) 07/06/2022   HGBA1C 7.0 (H) 03/06/2022   Lab Results  Component Value Date   CHOL 132 01/23/2023   Lab Results  Component Value Date   HDL 42 01/23/2023   Lab Results  Component Value Date   LDLCALC 71 01/23/2023   Lab Results   Component Value Date   TRIG 101 01/23/2023   Lab Results  Component Value Date   CHOLHDL 3.1 01/23/2023   Lab Results  Component Value Date   CREATININE 0.63 01/23/2023   No results found for: "GFR" No results found for: "MICROALBUR", "MALB24HUR"    Component Value Date/Time   NA 138 01/23/2023 1232   K 4.3 01/23/2023 1232   CL 103 01/23/2023 1232   CO2 23 01/23/2023 1232   GLUCOSE 140 (H) 01/23/2023 1232   GLUCOSE 126 (H) 05/12/2022 2139   BUN 11 01/23/2023 1232   CREATININE 0.63 01/23/2023 1232   CALCIUM 8.9 01/23/2023 1232   PROT 6.9 01/23/2023 1232   ALBUMIN 3.9 (L) 01/23/2023 1232   AST 17 01/23/2023 1232   ALT 18 01/23/2023 1232   ALKPHOS 105 01/23/2023 1232   BILITOT <0.2 01/23/2023 1232   GFRNONAA >60 05/12/2022 2139   GFRAA  03/29/2009 1840    NOT CALCULATED        The eGFR has been calculated using the MDRD equation. This calculation has not been validated in all clinical situations. eGFR's persistently <60 mL/min signify possible Chronic Kidney Disease.      Latest Ref Rng & Units 01/23/2023   12:32 PM 07/06/2022   11:07 AM 05/12/2022    9:39 PM  BMP  Glucose 70 - 99 mg/dL 161  096  045   BUN 6 - 20 mg/dL 11  15  14    Creatinine 0.57 - 1.00 mg/dL 4.09  8.11  9.14   BUN/Creat Ratio 9 - 23 17  20     Sodium 134 - 144 mmol/L 138  138  137   Potassium 3.5 - 5.2 mmol/L 4.3  4.4  3.8   Chloride 96 - 106 mmol/L 103  101  100   CO2 20 - 29 mmol/L 23  24  27    Calcium 8.7 - 10.2 mg/dL 8.9  9.3  8.9        Component Value Date/Time   WBC 7.3 01/23/2023 1232   WBC 12.3 (H) 05/12/2022 2139   RBC 4.51 01/23/2023 1232   RBC 4.42 05/12/2022 2139   HGB 11.8 01/23/2023 1232   HCT 37.5 01/23/2023 1232   PLT 331 01/23/2023 1232   MCV 83 01/23/2023 1232   MCH 26.2 (L) 01/23/2023 1232   MCH 26.0 05/12/2022 2139   MCHC 31.5 01/23/2023 1232   MCHC 30.4 05/12/2022 2139   RDW 16.1 (H) 01/23/2023 1232   LYMPHSABS 2.3 01/23/2023 1232   MONOABS 0.7 07/20/2020  1150   EOSABS 0.2 01/23/2023 1232   BASOSABS 0.0 01/23/2023 1232     Parts of this note may have been dictated using voice recognition software. There may be variances in spelling and vocabulary which are unintentional. Not all errors are proofread. Please notify the Thereasa Parkin if any discrepancies are noted or if the meaning of any statement is not clear.

## 2023-03-05 ENCOUNTER — Other Ambulatory Visit: Payer: Self-pay | Admitting: Nurse Practitioner

## 2023-03-05 DIAGNOSIS — N910 Primary amenorrhea: Secondary | ICD-10-CM

## 2023-03-05 DIAGNOSIS — E1165 Type 2 diabetes mellitus with hyperglycemia: Secondary | ICD-10-CM

## 2023-03-05 DIAGNOSIS — E559 Vitamin D deficiency, unspecified: Secondary | ICD-10-CM

## 2023-03-05 DIAGNOSIS — Q8711 Prader-Willi syndrome: Secondary | ICD-10-CM

## 2023-03-05 DIAGNOSIS — Z5189 Encounter for other specified aftercare: Secondary | ICD-10-CM

## 2023-03-05 DIAGNOSIS — Z6841 Body Mass Index (BMI) 40.0 and over, adult: Secondary | ICD-10-CM

## 2023-03-05 DIAGNOSIS — F424 Excoriation (skin-picking) disorder: Secondary | ICD-10-CM

## 2023-03-05 DIAGNOSIS — E1169 Type 2 diabetes mellitus with other specified complication: Secondary | ICD-10-CM

## 2023-03-05 DIAGNOSIS — F5105 Insomnia due to other mental disorder: Secondary | ICD-10-CM

## 2023-03-05 DIAGNOSIS — E538 Deficiency of other specified B group vitamins: Secondary | ICD-10-CM

## 2023-03-07 ENCOUNTER — Telehealth: Payer: Self-pay

## 2023-03-07 NOTE — Telephone Encounter (Signed)
Patient father would like to transfer care from Dr. Roosevelt Locks to Dr. Lonzo Cloud. Please advise if switch is okay.

## 2023-03-09 ENCOUNTER — Encounter: Payer: Self-pay | Admitting: Nurse Practitioner

## 2023-03-09 NOTE — Telephone Encounter (Signed)
 Care team updated and letter sent for eye exam notes.

## 2023-03-12 ENCOUNTER — Ambulatory Visit (HOSPITAL_BASED_OUTPATIENT_CLINIC_OR_DEPARTMENT_OTHER): Payer: 59 | Admitting: General Surgery

## 2023-03-14 ENCOUNTER — Ambulatory Visit (HOSPITAL_COMMUNITY): Payer: Self-pay | Admitting: Physician Assistant

## 2023-03-17 ENCOUNTER — Other Ambulatory Visit: Payer: Self-pay | Admitting: Nurse Practitioner

## 2023-03-17 DIAGNOSIS — M4100 Infantile idiopathic scoliosis, site unspecified: Secondary | ICD-10-CM

## 2023-03-17 DIAGNOSIS — R030 Elevated blood-pressure reading, without diagnosis of hypertension: Secondary | ICD-10-CM

## 2023-03-17 DIAGNOSIS — F424 Excoriation (skin-picking) disorder: Secondary | ICD-10-CM

## 2023-03-17 DIAGNOSIS — Z6841 Body Mass Index (BMI) 40.0 and over, adult: Secondary | ICD-10-CM

## 2023-03-17 DIAGNOSIS — Q8711 Prader-Willi syndrome: Secondary | ICD-10-CM

## 2023-03-17 DIAGNOSIS — E1165 Type 2 diabetes mellitus with hyperglycemia: Secondary | ICD-10-CM

## 2023-03-22 MED ORDER — TROSPIUM CHLORIDE 20 MG PO TABS: 20.0000 mg | ORAL_TABLET | Freq: Two times a day (BID) | ORAL | 2 refills | Status: AC

## 2023-03-22 NOTE — Telephone Encounter (Signed)
I have put in a new referral for [facility]. Please advise about the Oxybutynin

## 2023-03-30 ENCOUNTER — Ambulatory Visit: Payer: 59 | Admitting: "Endocrinology

## 2023-03-31 ENCOUNTER — Other Ambulatory Visit: Payer: Self-pay | Admitting: Nurse Practitioner

## 2023-04-19 ENCOUNTER — Other Ambulatory Visit: Payer: Self-pay | Admitting: Nurse Practitioner

## 2023-04-19 DIAGNOSIS — Z794 Long term (current) use of insulin: Secondary | ICD-10-CM

## 2023-04-26 ENCOUNTER — Other Ambulatory Visit (HOSPITAL_BASED_OUTPATIENT_CLINIC_OR_DEPARTMENT_OTHER): Payer: Self-pay

## 2023-05-03 ENCOUNTER — Ambulatory Visit: Payer: 59 | Admitting: Dietician

## 2023-05-04 ENCOUNTER — Ambulatory Visit (INDEPENDENT_AMBULATORY_CARE_PROVIDER_SITE_OTHER): Payer: 59

## 2023-05-04 DIAGNOSIS — Z3042 Encounter for surveillance of injectable contraceptive: Secondary | ICD-10-CM

## 2023-05-04 MED ORDER — MEDROXYPROGESTERONE ACETATE 150 MG/ML IM SUSY
150.0000 mg | PREFILLED_SYRINGE | Freq: Once | INTRAMUSCULAR | Status: AC
  Administered 2023-05-04: 150 mg via INTRAMUSCULAR

## 2023-05-06 ENCOUNTER — Other Ambulatory Visit: Payer: Self-pay | Admitting: Nurse Practitioner

## 2023-05-06 DIAGNOSIS — E1165 Type 2 diabetes mellitus with hyperglycemia: Secondary | ICD-10-CM

## 2023-05-06 DIAGNOSIS — Q8711 Prader-Willi syndrome: Secondary | ICD-10-CM

## 2023-05-06 DIAGNOSIS — M4100 Infantile idiopathic scoliosis, site unspecified: Secondary | ICD-10-CM

## 2023-05-06 DIAGNOSIS — Z6841 Body Mass Index (BMI) 40.0 and over, adult: Secondary | ICD-10-CM

## 2023-05-06 DIAGNOSIS — F424 Excoriation (skin-picking) disorder: Secondary | ICD-10-CM

## 2023-05-06 DIAGNOSIS — R030 Elevated blood-pressure reading, without diagnosis of hypertension: Secondary | ICD-10-CM

## 2023-05-08 ENCOUNTER — Other Ambulatory Visit: Payer: Self-pay | Admitting: Nurse Practitioner

## 2023-05-11 ENCOUNTER — Other Ambulatory Visit: Payer: Self-pay | Admitting: Nurse Practitioner

## 2023-05-11 DIAGNOSIS — Z794 Long term (current) use of insulin: Secondary | ICD-10-CM

## 2023-05-11 DIAGNOSIS — M4100 Infantile idiopathic scoliosis, site unspecified: Secondary | ICD-10-CM

## 2023-05-11 DIAGNOSIS — E1165 Type 2 diabetes mellitus with hyperglycemia: Secondary | ICD-10-CM

## 2023-05-11 DIAGNOSIS — Z6841 Body Mass Index (BMI) 40.0 and over, adult: Secondary | ICD-10-CM

## 2023-05-11 DIAGNOSIS — F424 Excoriation (skin-picking) disorder: Secondary | ICD-10-CM

## 2023-05-11 DIAGNOSIS — R030 Elevated blood-pressure reading, without diagnosis of hypertension: Secondary | ICD-10-CM

## 2023-05-11 DIAGNOSIS — Q8711 Prader-Willi syndrome: Secondary | ICD-10-CM

## 2023-05-11 NOTE — Telephone Encounter (Signed)
Saw this in historically did not see where you had filled this before.

## 2023-05-23 ENCOUNTER — Encounter: Payer: Self-pay | Admitting: Nurse Practitioner

## 2023-05-28 ENCOUNTER — Encounter: Payer: 59 | Admitting: Nurse Practitioner

## 2023-05-30 ENCOUNTER — Ambulatory Visit: Payer: 59 | Admitting: Internal Medicine

## 2023-06-13 ENCOUNTER — Other Ambulatory Visit: Payer: Self-pay | Admitting: Nurse Practitioner

## 2023-06-24 ENCOUNTER — Other Ambulatory Visit: Payer: Self-pay | Admitting: Nurse Practitioner

## 2023-06-24 DIAGNOSIS — N3946 Mixed incontinence: Secondary | ICD-10-CM

## 2023-06-25 NOTE — Telephone Encounter (Signed)
 Last apt 01/23/23 not our pt. Any more.

## 2023-07-17 ENCOUNTER — Other Ambulatory Visit: Payer: Self-pay | Admitting: Nurse Practitioner

## 2023-07-17 DIAGNOSIS — T148XXA Other injury of unspecified body region, initial encounter: Secondary | ICD-10-CM

## 2023-07-27 ENCOUNTER — Ambulatory Visit (INDEPENDENT_AMBULATORY_CARE_PROVIDER_SITE_OTHER): Payer: 59 | Admitting: *Deleted

## 2023-07-27 ENCOUNTER — Other Ambulatory Visit: Payer: Self-pay | Admitting: Nurse Practitioner

## 2023-07-27 DIAGNOSIS — R030 Elevated blood-pressure reading, without diagnosis of hypertension: Secondary | ICD-10-CM

## 2023-07-27 DIAGNOSIS — E1165 Type 2 diabetes mellitus with hyperglycemia: Secondary | ICD-10-CM

## 2023-07-27 DIAGNOSIS — Z3042 Encounter for surveillance of injectable contraceptive: Secondary | ICD-10-CM | POA: Diagnosis not present

## 2023-07-27 DIAGNOSIS — Z6841 Body Mass Index (BMI) 40.0 and over, adult: Secondary | ICD-10-CM

## 2023-07-27 MED ORDER — MEDROXYPROGESTERONE ACETATE 150 MG/ML IM SUSY
150.0000 mg | PREFILLED_SYRINGE | Freq: Once | INTRAMUSCULAR | Status: AC
  Administered 2023-07-27: 150 mg via INTRAMUSCULAR

## 2023-09-10 ENCOUNTER — Encounter: Payer: Self-pay | Admitting: Obstetrics and Gynecology

## 2023-10-15 ENCOUNTER — Ambulatory Visit

## 2023-11-05 ENCOUNTER — Other Ambulatory Visit: Payer: Self-pay | Admitting: Nurse Practitioner

## 2023-11-05 DIAGNOSIS — E1165 Type 2 diabetes mellitus with hyperglycemia: Secondary | ICD-10-CM

## 2023-11-05 DIAGNOSIS — Z794 Long term (current) use of insulin: Secondary | ICD-10-CM

## 2024-02-17 ENCOUNTER — Other Ambulatory Visit (HOSPITAL_BASED_OUTPATIENT_CLINIC_OR_DEPARTMENT_OTHER): Payer: Self-pay | Admitting: Nurse Practitioner

## 2024-02-17 DIAGNOSIS — E559 Vitamin D deficiency, unspecified: Secondary | ICD-10-CM

## 2024-03-08 ENCOUNTER — Other Ambulatory Visit (HOSPITAL_BASED_OUTPATIENT_CLINIC_OR_DEPARTMENT_OTHER): Payer: Self-pay
